# Patient Record
Sex: Male | Born: 1945 | Race: White | Hispanic: No | Smoking: Never smoker
Health system: Southern US, Community
[De-identification: ages and names within clinical notes are randomized; demographics above are authoritative.]

## PROBLEM LIST (undated history)

## (undated) DIAGNOSIS — I872 Venous insufficiency (chronic) (peripheral): Secondary | ICD-10-CM

## (undated) DIAGNOSIS — E1142 Type 2 diabetes mellitus with diabetic polyneuropathy: Secondary | ICD-10-CM

## (undated) DIAGNOSIS — M5116 Intervertebral disc disorders with radiculopathy, lumbar region: Secondary | ICD-10-CM

## (undated) DIAGNOSIS — K589 Irritable bowel syndrome without diarrhea: Secondary | ICD-10-CM

## (undated) DIAGNOSIS — N471 Phimosis: Secondary | ICD-10-CM

## (undated) DIAGNOSIS — M48061 Spinal stenosis, lumbar region without neurogenic claudication: Secondary | ICD-10-CM

## (undated) DIAGNOSIS — M159 Polyosteoarthritis, unspecified: Secondary | ICD-10-CM

## (undated) DIAGNOSIS — F32A Depression, unspecified: Secondary | ICD-10-CM

## (undated) DIAGNOSIS — K219 Gastro-esophageal reflux disease without esophagitis: Secondary | ICD-10-CM

## (undated) DIAGNOSIS — F329 Major depressive disorder, single episode, unspecified: Secondary | ICD-10-CM

## (undated) DIAGNOSIS — C44229 Squamous cell carcinoma of skin of left ear and external auricular canal: Secondary | ICD-10-CM

## (undated) DIAGNOSIS — I10 Essential (primary) hypertension: Secondary | ICD-10-CM

## (undated) DIAGNOSIS — C801 Malignant (primary) neoplasm, unspecified: Secondary | ICD-10-CM

## (undated) HISTORY — DX: Gastro-esophageal reflux disease without esophagitis: K21.9

## (undated) HISTORY — DX: Type 2 diabetes mellitus with diabetic polyneuropathy: E11.42

## (undated) HISTORY — DX: Irritable bowel syndrome, unspecified: K58.9

## (undated) HISTORY — DX: Essential (primary) hypertension: I10

## (undated) HISTORY — PX: REPLACEMENT TOTAL KNEE: SUR1224

## (undated) HISTORY — DX: Major depressive disorder, single episode, unspecified: F32.9

## (undated) HISTORY — PX: TOTAL HIP ARTHROPLASTY: SHX124

## (undated) HISTORY — DX: Depression, unspecified: F32.A

## (undated) HISTORY — DX: Polyosteoarthritis, unspecified: M15.9

---
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
## 2004-11-24 ENCOUNTER — Ambulatory Visit: Payer: Self-pay | Admitting: Internal Medicine

## 2007-02-15 ENCOUNTER — Ambulatory Visit: Payer: Self-pay | Admitting: Internal Medicine

## 2007-02-15 ENCOUNTER — Ambulatory Visit: Payer: Self-pay

## 2007-02-20 ENCOUNTER — Ambulatory Visit: Payer: Self-pay | Admitting: Physician Assistant

## 2008-12-16 ENCOUNTER — Ambulatory Visit: Payer: Self-pay

## 2009-03-07 HISTORY — PX: KNEE ARTHROSCOPY: SUR90

## 2009-04-01 ENCOUNTER — Ambulatory Visit: Payer: Self-pay | Admitting: General Practice

## 2009-04-01 ENCOUNTER — Ambulatory Visit: Payer: Self-pay | Admitting: Internal Medicine

## 2009-04-08 ENCOUNTER — Ambulatory Visit: Payer: Self-pay | Admitting: General Practice

## 2009-07-13 ENCOUNTER — Ambulatory Visit: Payer: Self-pay

## 2009-08-03 ENCOUNTER — Ambulatory Visit: Payer: Self-pay | Admitting: General Practice

## 2009-08-17 ENCOUNTER — Inpatient Hospital Stay: Payer: Self-pay | Admitting: General Practice

## 2009-11-17 ENCOUNTER — Ambulatory Visit: Payer: Self-pay | Admitting: General Practice

## 2009-11-20 ENCOUNTER — Ambulatory Visit: Payer: Self-pay

## 2010-02-17 ENCOUNTER — Ambulatory Visit: Payer: Self-pay | Admitting: General Practice

## 2010-02-22 ENCOUNTER — Inpatient Hospital Stay: Payer: Self-pay | Admitting: General Practice

## 2010-03-02 LAB — PATHOLOGY REPORT

## 2012-02-06 ENCOUNTER — Ambulatory Visit: Payer: Self-pay | Admitting: Gastroenterology

## 2012-02-07 LAB — PATHOLOGY REPORT

## 2014-04-01 ENCOUNTER — Ambulatory Visit: Payer: Self-pay | Admitting: General Practice

## 2016-06-02 DIAGNOSIS — Z0000 Encounter for general adult medical examination without abnormal findings: Secondary | ICD-10-CM | POA: Diagnosis not present

## 2016-07-13 DIAGNOSIS — X32XXXA Exposure to sunlight, initial encounter: Secondary | ICD-10-CM | POA: Diagnosis not present

## 2016-07-13 DIAGNOSIS — D2271 Melanocytic nevi of right lower limb, including hip: Secondary | ICD-10-CM | POA: Diagnosis not present

## 2016-07-13 DIAGNOSIS — L570 Actinic keratosis: Secondary | ICD-10-CM | POA: Diagnosis not present

## 2016-07-13 DIAGNOSIS — D225 Melanocytic nevi of trunk: Secondary | ICD-10-CM | POA: Diagnosis not present

## 2016-07-13 DIAGNOSIS — D2261 Melanocytic nevi of right upper limb, including shoulder: Secondary | ICD-10-CM | POA: Diagnosis not present

## 2016-07-13 DIAGNOSIS — D485 Neoplasm of uncertain behavior of skin: Secondary | ICD-10-CM | POA: Diagnosis not present

## 2016-07-13 DIAGNOSIS — Z85828 Personal history of other malignant neoplasm of skin: Secondary | ICD-10-CM | POA: Diagnosis not present

## 2016-07-13 DIAGNOSIS — L820 Inflamed seborrheic keratosis: Secondary | ICD-10-CM | POA: Diagnosis not present

## 2016-08-29 DIAGNOSIS — X32XXXA Exposure to sunlight, initial encounter: Secondary | ICD-10-CM | POA: Diagnosis not present

## 2016-08-29 DIAGNOSIS — L570 Actinic keratosis: Secondary | ICD-10-CM | POA: Diagnosis not present

## 2016-10-27 DIAGNOSIS — Z125 Encounter for screening for malignant neoplasm of prostate: Secondary | ICD-10-CM | POA: Diagnosis not present

## 2016-10-27 DIAGNOSIS — E119 Type 2 diabetes mellitus without complications: Secondary | ICD-10-CM | POA: Diagnosis not present

## 2016-10-27 DIAGNOSIS — Z79899 Other long term (current) drug therapy: Secondary | ICD-10-CM | POA: Diagnosis not present

## 2016-10-27 DIAGNOSIS — E782 Mixed hyperlipidemia: Secondary | ICD-10-CM | POA: Diagnosis not present

## 2016-11-03 DIAGNOSIS — Z79899 Other long term (current) drug therapy: Secondary | ICD-10-CM | POA: Diagnosis not present

## 2016-11-03 DIAGNOSIS — G4719 Other hypersomnia: Secondary | ICD-10-CM | POA: Diagnosis not present

## 2016-11-03 DIAGNOSIS — I10 Essential (primary) hypertension: Secondary | ICD-10-CM | POA: Diagnosis not present

## 2016-11-03 DIAGNOSIS — Z0000 Encounter for general adult medical examination without abnormal findings: Secondary | ICD-10-CM | POA: Diagnosis not present

## 2016-11-03 DIAGNOSIS — R5383 Other fatigue: Secondary | ICD-10-CM | POA: Diagnosis not present

## 2016-11-03 DIAGNOSIS — E119 Type 2 diabetes mellitus without complications: Secondary | ICD-10-CM | POA: Diagnosis not present

## 2017-02-09 DIAGNOSIS — Z23 Encounter for immunization: Secondary | ICD-10-CM | POA: Diagnosis not present

## 2017-02-26 DIAGNOSIS — J40 Bronchitis, not specified as acute or chronic: Secondary | ICD-10-CM | POA: Diagnosis not present

## 2017-04-18 ENCOUNTER — Ambulatory Visit (INDEPENDENT_AMBULATORY_CARE_PROVIDER_SITE_OTHER): Payer: Medicare Other | Admitting: Internal Medicine

## 2017-04-18 ENCOUNTER — Encounter: Payer: Self-pay | Admitting: Internal Medicine

## 2017-04-18 VITALS — BP 120/72 | HR 77 | Temp 98.3°F | Ht 67.75 in | Wt 226.2 lb

## 2017-04-18 DIAGNOSIS — I739 Peripheral vascular disease, unspecified: Secondary | ICD-10-CM

## 2017-04-18 DIAGNOSIS — K580 Irritable bowel syndrome with diarrhea: Secondary | ICD-10-CM | POA: Diagnosis not present

## 2017-04-18 DIAGNOSIS — M48062 Spinal stenosis, lumbar region with neurogenic claudication: Secondary | ICD-10-CM | POA: Insufficient documentation

## 2017-04-18 DIAGNOSIS — E1142 Type 2 diabetes mellitus with diabetic polyneuropathy: Secondary | ICD-10-CM | POA: Diagnosis not present

## 2017-04-18 DIAGNOSIS — I10 Essential (primary) hypertension: Secondary | ICD-10-CM

## 2017-04-18 DIAGNOSIS — K589 Irritable bowel syndrome without diarrhea: Secondary | ICD-10-CM | POA: Insufficient documentation

## 2017-04-18 DIAGNOSIS — M159 Polyosteoarthritis, unspecified: Secondary | ICD-10-CM | POA: Insufficient documentation

## 2017-04-18 DIAGNOSIS — M150 Primary generalized (osteo)arthritis: Secondary | ICD-10-CM | POA: Diagnosis not present

## 2017-04-18 LAB — CBC
HCT: 41.4 % (ref 39.0–52.0)
Hemoglobin: 13.9 g/dL (ref 13.0–17.0)
MCHC: 33.7 g/dL (ref 30.0–36.0)
MCV: 94.6 fl (ref 78.0–100.0)
PLATELETS: 288 10*3/uL (ref 150.0–400.0)
RBC: 4.37 Mil/uL (ref 4.22–5.81)
RDW: 13.4 % (ref 11.5–15.5)
WBC: 9.1 10*3/uL (ref 4.0–10.5)

## 2017-04-18 LAB — COMPREHENSIVE METABOLIC PANEL
ALT: 24 U/L (ref 0–53)
AST: 18 U/L (ref 0–37)
Albumin: 4.2 g/dL (ref 3.5–5.2)
Alkaline Phosphatase: 60 U/L (ref 39–117)
BILIRUBIN TOTAL: 0.8 mg/dL (ref 0.2–1.2)
BUN: 12 mg/dL (ref 6–23)
CALCIUM: 9.7 mg/dL (ref 8.4–10.5)
CO2: 29 meq/L (ref 19–32)
Chloride: 102 mEq/L (ref 96–112)
Creatinine, Ser: 0.71 mg/dL (ref 0.40–1.50)
GFR: 115.97 mL/min (ref >60.00)
GLUCOSE: 96 mg/dL (ref 70–99)
Potassium: 4 mEq/L (ref 3.5–5.1)
Sodium: 139 mEq/L (ref 135–145)
TOTAL PROTEIN: 7.1 g/dL (ref 6.0–8.3)

## 2017-04-18 LAB — LIPID PANEL
Cholesterol: 169 mg/dL (ref 0–200)
HDL: 34.9 mg/dL — ABNORMAL LOW (ref >39.00)
NONHDL: 134.42
TRIGLYCERIDES: 311 mg/dL — AB (ref 0.0–149.0)
Total CHOL/HDL Ratio: 5
VLDL: 62.2 mg/dL — ABNORMAL HIGH (ref 0.0–40.0)

## 2017-04-18 LAB — HEMOGLOBIN A1C: HEMOGLOBIN A1C: 6.4 % (ref 4.6–6.5)

## 2017-04-18 LAB — LDL CHOLESTEROL, DIRECT: Direct LDL: 82 mg/dL

## 2017-04-18 NOTE — Assessment & Plan Note (Signed)
Has had excellent control Very early neurologic changes Will need statin--I will prescribe after labs and circulation tests

## 2017-04-18 NOTE — Assessment & Plan Note (Signed)
Mild symptoms now after 2 joint replacements

## 2017-04-18 NOTE — Assessment & Plan Note (Signed)
BP Readings from Last 3 Encounters:  04/18/17 120/72   Good control

## 2017-04-18 NOTE — Assessment & Plan Note (Signed)
Fairly classic symptoms in diabetic with decreased pulses Will check ABI and proceed with referral if confirmed

## 2017-04-18 NOTE — Progress Notes (Signed)
Subjective:    Patient ID: Frank Vega, male    DOB: 12/05/1945, 72 y.o.   MRN: [id number]  HPI Here to establish for ongoing care Didn't feel he was "on the same wavelength" with Dr Baldemar Lenis Had really been happy with Dr Arline Asp  Having some ongoing leg pain Got different medications---not a clear diagnosis Feet swell at times Muscles in back of calves tighten up---trouble with walking No problems at rest or in bed  Diabetes for 7 years of so Does have mild abnormal sensation in feet--- notes it especially when driving Good control--tries to eat right  Recent laser Rx of right eye--but not diabetic changes  Known HTN Good control in general  History of IBS Diarrhea predominant---has lomotil for prn use  Known osteoarthritis Some ongoing pain in left knee despite TKR No other major problems--occasional back pain if sits  Depression back years ago Had been staying with his mom for 2 years --caregiving. Eventually died from dementia Also had stress in his job Was on paxil--didn't like that. Then lexapro--weaned off years ago and no ongoing problems  Acid reflux in past Better now  Current Outpatient Medications on File Prior to Visit  Medication Sig Dispense Refill  . amLODipine (NORVASC) 5 MG tablet Take 5 mg by mouth daily.    Marland Kitchen aspirin 81 MG chewable tablet Chew 81 mg by mouth daily.    . diphenoxylate-atropine (LOMOTIL) 2.5-0.025 MG tablet Take 1 tablet by mouth daily as needed for diarrhea or loose stools.    Marland Kitchen glipiZIDE (GLUCOTROL XL) 5 MG 24 hr tablet Take 5 mg by mouth daily with breakfast.    . lisinopril-hydrochlorothiazide (PRINZIDE,ZESTORETIC) 20-12.5 MG tablet Take 2 tablets by mouth daily.    . metFORMIN (GLUCOPHAGE-XR) 750 MG 24 hr tablet Take 750 mg by mouth daily.    . Multiple Vitamins-Minerals (CENTRUM SILVER PO) Take 1 tablet by mouth daily.     No current facility-administered medications on file prior to visit.     Allergies  Allergen  Reactions  . Paxil [Paroxetine Hcl]     Weight loss and jittery  . Shellfish Allergy Swelling  . Latex Rash    Past Medical History:  Diagnosis Date  . 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 GERD (gastroesophageal reflux disease)   . Hypertension   . IBS (irritable bowel syndrome)   . Osteoarthritis of multiple joints     Past Surgical History:  Procedure Laterality Date  . CYST EXCISION  1952   left leg  . KNEE ARTHROSCOPY Left 2011  . REPLACEMENT TOTAL KNEE Left   . TOTAL HIP ARTHROPLASTY Right     Family History  Problem Relation Age of Onset  . Arthritis Mother   . Hypertension Mother   . Dementia Mother   . Heart attack Father   . Heart disease Father   . Cancer Father        lymphoma    Social History   Socioeconomic History  . Marital status: Married    Spouse name: Not on file  . Number of children: 1  . Years of education: Not on file  . Highest education level: Not on file  Social Needs  . Financial resource strain: Not on file  . Food insecurity - worry: Not on file  . Food insecurity - inability: Not on file  . Transportation needs - medical: Not on file  . Transportation needs -  non-medical: Not on file  Occupational History  . Occupation: Electrical engineer    Comment: Retired  Tobacco Use  . Smoking status: Never Smoker  . Smokeless tobacco: Never Used  Substance and Sexual Activity  . Alcohol use: No    Frequency: Never  . Drug use: No  . Sexual activity: No  Other Topics Concern  . Not on file  Social History Narrative   1 daughter--local      No living will   Wife, then daughter, should make health care decisions   Would accept resuscitation   Not sure about tube feeds   Review of Systems  Constitutional: Negative for fatigue and unexpected weight change.  HENT: Positive for hearing loss.        Right ear hearing loss Top partial plate  Eyes: Positive  for visual disturbance.       Needs follow up  Respiratory: Negative for cough, chest tightness and shortness of breath.   Cardiovascular: Positive for leg swelling. Negative for chest pain and palpitations.  Gastrointestinal: Positive for diarrhea. Negative for abdominal pain and blood in stool.  Endocrine: Negative for polydipsia and polyuria.  Genitourinary: Positive for frequency. Negative for difficulty urinating.       Nocturia x 1 Foreskin is stuck closed  Musculoskeletal: Positive for arthralgias. Negative for joint swelling.  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  Neurological: Negative for dizziness, syncope and light-headedness.  Psychiatric/Behavioral: Negative for dysphoric mood and sleep disturbance. The patient is not nervous/anxious.        Sleeps 6.5-7 hours most nights       Objective:   Physical Exam  Constitutional: He appears well-developed. No distress.  Neck: No thyromegaly present.  Cardiovascular: Normal rate, regular rhythm and normal heart sounds. Exam reveals no gallop.  No murmur heard. Fair left foot DP but no PT Very faint right PT but no DP  Pulmonary/Chest: Effort normal and breath sounds normal. No respiratory distress. He has no wheezes. He has no rales.  Abdominal: Soft. There is no tenderness.  Musculoskeletal: He exhibits no edema or tenderness.  Lymphadenopathy:    He has no cervical adenopathy.  Psychiatric: He has a normal mood and affect. His behavior is normal.          Assessment & Plan:

## 2017-04-18 NOTE — Assessment & Plan Note (Signed)
Uses the lomotil prn (rarely)

## 2017-04-19 ENCOUNTER — Other Ambulatory Visit: Payer: Self-pay

## 2017-04-19 MED ORDER — ATORVASTATIN CALCIUM 10 MG PO TABS: 10.0000 mg | ORAL_TABLET | Freq: Every day | ORAL | 3 refills | Status: DC

## 2017-04-19 NOTE — Telephone Encounter (Signed)
Per lab result, pt agrees to start atorvastatin 10mg .

## 2017-05-03 ENCOUNTER — Ambulatory Visit (INDEPENDENT_AMBULATORY_CARE_PROVIDER_SITE_OTHER): Payer: Medicare Other

## 2017-05-03 DIAGNOSIS — I739 Peripheral vascular disease, unspecified: Secondary | ICD-10-CM

## 2017-05-18 DIAGNOSIS — Z86010 Personal history of colonic polyps: Secondary | ICD-10-CM | POA: Diagnosis not present

## 2017-05-22 DIAGNOSIS — X32XXXA Exposure to sunlight, initial encounter: Secondary | ICD-10-CM | POA: Diagnosis not present

## 2017-05-22 DIAGNOSIS — L570 Actinic keratosis: Secondary | ICD-10-CM | POA: Diagnosis not present

## 2017-05-22 DIAGNOSIS — D225 Melanocytic nevi of trunk: Secondary | ICD-10-CM | POA: Diagnosis not present

## 2017-05-22 DIAGNOSIS — L568 Other specified acute skin changes due to ultraviolet radiation: Secondary | ICD-10-CM | POA: Diagnosis not present

## 2017-05-22 DIAGNOSIS — D2261 Melanocytic nevi of right upper limb, including shoulder: Secondary | ICD-10-CM | POA: Diagnosis not present

## 2017-05-22 DIAGNOSIS — Z85828 Personal history of other malignant neoplasm of skin: Secondary | ICD-10-CM | POA: Diagnosis not present

## 2017-05-23 ENCOUNTER — Encounter: Payer: Self-pay | Admitting: Internal Medicine

## 2017-05-23 ENCOUNTER — Ambulatory Visit (INDEPENDENT_AMBULATORY_CARE_PROVIDER_SITE_OTHER): Payer: Medicare Other | Admitting: Internal Medicine

## 2017-05-23 VITALS — BP 124/78 | HR 70 | Temp 98.0°F | Wt 228.2 lb

## 2017-05-23 DIAGNOSIS — M48062 Spinal stenosis, lumbar region with neurogenic claudication: Secondary | ICD-10-CM

## 2017-05-23 MED ORDER — DIPHENOXYLATE-ATROPINE 2.5-0.025 MG PO TABS: 1.0000 | ORAL_TABLET | Freq: Every day | ORAL | 1 refills | Status: DC | PRN

## 2017-05-23 NOTE — Progress Notes (Signed)
Subjective:    Patient ID: Frank Vega, male    DOB: 1945/09/08, 72 y.o.   MRN: [id number]  HPI Here for review of leg pain  When he stands up--he has to work to balance himself Through the day, things seem to progress with increasing calf pain Right foot seems to swell--larger than left Some decreased sensation in toes--notices when driving  Calf muscles are where he feels the pain Notices it just standing in line--will shift his feet for balance--but pain if on feet for a while Increasing pain with walking (tight feeling)--better with rest  Right THR--no pain Some swelling (fluid?) in left knee with TKR No sig pain in left hip or right knee  Current Outpatient Medications on File Prior to Visit  Medication Sig Dispense Refill  . amLODipine (NORVASC) 5 MG tablet Take 5 mg by mouth daily.    Marland Kitchen atorvastatin (LIPITOR) 10 MG tablet Take 1 tablet (10 mg total) by mouth daily. 90 tablet 3  . diphenoxylate-atropine (LOMOTIL) 2.5-0.025 MG tablet Take 1 tablet by mouth daily as needed for diarrhea or loose stools.    Marland Kitchen glipiZIDE (GLUCOTROL XL) 5 MG 24 hr tablet Take 5 mg by mouth daily with breakfast.    . lisinopril-hydrochlorothiazide (PRINZIDE,ZESTORETIC) 20-12.5 MG tablet Take 2 tablets by mouth daily.    . metFORMIN (GLUCOPHAGE-XR) 750 MG 24 hr tablet Take 750 mg by mouth daily.    . mometasone (ELOCON) 0.1 % ointment Apply topically 2 (two) times daily.    . Multiple Vitamins-Minerals (CENTRUM SILVER PO) Take 1 tablet by mouth daily.    Marland Kitchen aspirin 81 MG chewable tablet Chew 81 mg by mouth daily.     No current facility-administered medications on file prior to visit.     Allergies  Allergen Reactions  . Paxil [Paroxetine Hcl]     Weight loss and jittery  . Shellfish Allergy Swelling  . Latex Rash    Past Medical History:  Diagnosis Date  . 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 GERD (gastroesophageal  reflux disease)   . Hypertension   . IBS (irritable bowel syndrome)   . Osteoarthritis of multiple joints     Past Surgical History:  Procedure Laterality Date  . CYST EXCISION  1952   left leg  . KNEE ARTHROSCOPY Left 2011  . REPLACEMENT TOTAL KNEE Left   . TOTAL HIP ARTHROPLASTY Right     Family History  Problem Relation Age of Onset  . Arthritis Mother   . Hypertension Mother   . Dementia Mother   . Heart attack Father   . Heart disease Father   . Cancer Father        lymphoma    Social History   Socioeconomic History  . Marital status: Married    Spouse name: Not on file  . Number of children: 1  . Years of education: Not on file  . Highest education level: Not on file  Social Needs  . Financial resource strain: Not on file  . Food insecurity - worry: Not on file  . Food insecurity - inability: Not on file  . Transportation needs - medical: Not on file  . Transportation needs - non-medical: Not on file  Occupational History  . Occupation: Electrical engineer    Comment: Retired  Tobacco Use  . Smoking status: Never Smoker  . Smokeless tobacco: Never Used  Substance and Sexual Activity  .  Alcohol use: No    Frequency: Never  . Drug use: No  . Sexual activity: No  Other Topics Concern  . Not on file  Social History Narrative   1 daughter--local      No living will   Wife, then daughter, should make health care decisions   Would accept resuscitation   Not sure about tube feeds   Review of Systems Weight is stable Appetite is fine Not sleeping well Just saw Dr Evorn Gong for rash on arm--- got mometasone from him    Objective:   Physical Exam  Cardiovascular:  Feet warm Faint pulse left foot, absent on right  Musculoskeletal:  Hip and knee ROM all fine No swelling No spine tenderness  Neurological:  Normal leg strength          Assessment & Plan:

## 2017-05-23 NOTE — Patient Instructions (Signed)
Spinal Stenosis Spinal stenosis occurs when the open space (spinal canal) between the bones of your spine (vertebrae) narrows, putting pressure on the spinal cord or nerves. What are the causes? This condition is caused by areas of bone pushing into the central canals of your vertebrae. This condition may be present at birth (congenital), or it may be caused by:  Arthritic deterioration of your vertebrae (spinal degeneration). This usually starts around age 25.  Injury or trauma to the spine.  Tumors in the spine.  Calcium deposits in the spine.  What are the signs or symptoms? Symptoms of this condition include:  Pain in the neck or back that is generally worse with activities, particularly when standing and walking.  Numbness, tingling, hot or cold sensations, weakness, or weariness in your legs.  Pain going up and down the leg (sciatica).  Frequent episodes of falling.  A foot-slapping gait that leads to muscle weakness.  In more serious cases, you may develop:  Problemspassing stool or passing urine.  Difficulty having sex.  Loss of feeling in part or all of your leg.  Symptoms may come on slowly and get worse over time. How is this diagnosed? This condition is diagnosed based on your medical history and a physical exam. Tests will also be done, such as:  MRI.  CT scan.  X-ray.  How is this treated? Treatment for this condition often focuses on managing your pain and any other symptoms. Treatment may include:  Practicing good posture to lessen pressure on your nerves.  Exercising to strengthen muscles, build endurance, improve balance, and maintain good joint movement (range of motion).  Losing weight, if needed.  Taking medicines to reduce swelling, inflammation, or pain.  Assistive devices, such as a corset or brace.  In some cases, surgery may be needed. The most common procedure is decompression laminectomy. This is done to remove excess bone that  puts pressure on your nerve roots. Follow these instructions at home: Managing pain, stiffness, and swelling  Do all exercises and stretches as told by your health care provider.  Practice good posture. If you were given a brace or a corset, wear it as told by your health care provider.  Do not do any activities that cause pain. Ask your health care provider what activities are safe for you.  Do not lift anything that is heavier than 10 lb (4.5 kg) or the limit that your health care provider tells you.  Maintain a healthy weight. Talk with your health care provider if you need help losing weight.  If directed, apply heat to the affected area as often as told by your health care provider. Use the heat source that your health care provider recommends, such as a moist heat pack or a heating pad. ? Place a towel between your skin and the heat source. ? Leave the heat on for 20-30 minutes. ? Remove the heat if your skin turns bright red. This is especially important if you are not able to feel pain, heat, or cold. You may have a greater risk of getting burned. General instructions  Take over-the-counter and prescription medicines only as told by your health care provider.  Do not use any products that contain nicotine or tobacco, such as cigarettes and e-cigarettes. If you need help quitting, ask your health care provider.  Eat a healthy diet. This includes plenty of fruits and vegetables, whole grains, and low-fat (lean) protein.  Keep all follow-up visits as told by your health care provider.  This is important. Contact a health care provider if:  Your symptoms do not get better or they get worse.  You have a fever. Get help right away if:  You have new or worse pain in your neck or upper back.  You have severe pain that cannot be controlled with medicines.  You are dizzy.  You have vision problems, blurred vision, or double vision.  You have a severe headache that is worse when  you stand.  You have nausea or you vomit.  You develop new or worse numbness or tingling in your back or legs.  You have pain, redness, swelling, or warmth in your arm or leg. Summary  Spinal stenosis occurs when the open space (spinal canal) between the bones of your spine (vertebrae) narrows. This narrowing puts pressure on the spinal cord or nerves.  Spinal stenosis can cause numbness, weakness, or pain in the neck, back, and legs.  This condition may be caused by a birth defect, arthritic deterioration of your vertebrae, injury, tumors, or calcium deposits.  This condition is usually diagnosed with MRIs, CT scans, and X-rays. This information is not intended to replace advice given to you by your health care provider. Make sure you discuss any questions you have with your health care provider. Document Released: 05/14/2003 Document Revised: 01/27/2016 Document Reviewed: 01/27/2016 Elsevier Interactive Patient Education  2018 [employer]  

## 2017-05-23 NOTE — Assessment & Plan Note (Signed)
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

## 2017-06-26 ENCOUNTER — Telehealth: Payer: Self-pay

## 2017-06-26 NOTE — Telephone Encounter (Signed)
Could pt be added on before 06/29/17?

## 2017-06-26 NOTE — Telephone Encounter (Signed)
Can add at noon tomorrow---or same day Thursday afternoon

## 2017-06-26 NOTE — Telephone Encounter (Signed)
Copied from [place]. Topic: Appointment Scheduling - Scheduling Inquiry for Clinic >> Jun 26, 2017  4:44 PM Percell Belt A wrote: Reason for CRM: pt called in pt right foot has been swelling last 2 weeks. Would like to see Dr Silvio Pate only.  Tired to offer other providers.  Pt only wanted to see his PCP    Best number [phone]

## 2017-06-27 ENCOUNTER — Encounter: Payer: Self-pay | Admitting: Internal Medicine

## 2017-06-27 ENCOUNTER — Ambulatory Visit (INDEPENDENT_AMBULATORY_CARE_PROVIDER_SITE_OTHER): Payer: Medicare Other | Admitting: Internal Medicine

## 2017-06-27 VITALS — BP 120/66 | HR 72 | Temp 98.1°F | Ht 67.75 in | Wt 226.0 lb

## 2017-06-27 DIAGNOSIS — I872 Venous insufficiency (chronic) (peripheral): Secondary | ICD-10-CM | POA: Insufficient documentation

## 2017-06-27 DIAGNOSIS — R600 Localized edema: Secondary | ICD-10-CM | POA: Diagnosis not present

## 2017-06-27 NOTE — Progress Notes (Signed)
Subjective:    Patient ID: Frank Vega, male    DOB: 1945/08/30, 72 y.o.   MRN: [id number]  HPI Here due to right foot swelling  Has noticed about 2 months--but gradually worsening Shoes not fitting as well---even with the wider ones No pain ---unless shoe is tight No known injury No redness No varicose veins Is better first thing in the morning---worsens as the day goes on Hasn't tried support socks  Current Outpatient Medications on File Prior to Visit  Medication Sig Dispense Refill  . amLODipine (NORVASC) 5 MG tablet Take 5 mg by mouth daily.    Marland Kitchen aspirin 81 MG chewable tablet Chew 81 mg by mouth daily.    Marland Kitchen atorvastatin (LIPITOR) 10 MG tablet Take 1 tablet (10 mg total) by mouth daily. 90 tablet 3  . diphenoxylate-atropine (LOMOTIL) 2.5-0.025 MG tablet Take 1 tablet by mouth daily as needed for diarrhea or loose stools. 30 tablet 1  . glipiZIDE (GLUCOTROL XL) 5 MG 24 hr tablet Take 5 mg by mouth daily with breakfast.    . lisinopril-hydrochlorothiazide (PRINZIDE,ZESTORETIC) 20-12.5 MG tablet Take 2 tablets by mouth daily.    . metFORMIN (GLUCOPHAGE-XR) 750 MG 24 hr tablet Take 750 mg by mouth daily.    . mometasone (ELOCON) 0.1 % ointment Apply topically 2 (two) times daily.    . Multiple Vitamins-Minerals (CENTRUM SILVER PO) Take 1 tablet by mouth daily.     No current facility-administered medications on file prior to visit.     Allergies  Allergen Reactions  . Paxil [Paroxetine Hcl]     Weight loss and jittery  . Shellfish Allergy Swelling  . Latex Rash    Past Medical History:  Diagnosis Date  . 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 GERD (gastroesophageal reflux disease)   . Hypertension   . IBS (irritable bowel syndrome)   . Osteoarthritis of multiple joints     Past Surgical History:  Procedure Laterality Date  . CYST EXCISION  1952   left leg  . KNEE ARTHROSCOPY Left 2011  .  REPLACEMENT TOTAL KNEE Left   . TOTAL HIP ARTHROPLASTY Right     Family History  Problem Relation Age of Onset  . Arthritis Mother   . Hypertension Mother   . Dementia Mother   . Heart attack Father   . Heart disease Father   . Cancer Father        lymphoma    Social History   Socioeconomic History  . Marital status: Married    Spouse name: Not on file  . Number of children: 1  . Years of education: Not on file  . Highest education level: Not on file  Occupational History  . 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 Financial resource strain: Not on file  . Food insecurity:    Worry: Not on file    Inability: Not on file  . Transportation needs:    Medical: Not on file    Non-medical: Not on file  Tobacco Use  . Smoking status: Never Smoker  . Smokeless tobacco: Never Used  Substance and Sexual Activity  . Alcohol use: No    Frequency: Never  . Drug use: No  . Sexual activity: Never  Lifestyle  . Physical activity:    Days per week: Not on file    Minutes per session: Not on file  .  Stress: Not on file  Relationships  . Social connections:    Talks on phone: Not on file    Gets together: Not on file    Attends religious service: Not on file    Active member of club or organization: Not on file    Attends meetings of clubs or organizations: Not on file    Relationship status: Not on file  . Intimate partner violence:    Fear of current or ex partner: Not on file    Emotionally abused: Not on file    Physically abused: Not on file    Forced sexual activity: Not on file  Other Topics Concern  . Not on file  Social History Narrative   1 daughter--local      No living will   Wife, then daughter, should make health care decisions   Would accept resuscitation   Not sure about tube feeds   Review of Systems No chest pain No SOB Sleeps flat in bed--no PND Weight is stable    Objective:   Physical Exam    Constitutional: No distress.  Neck: No thyromegaly present.  Cardiovascular: Normal rate, regular rhythm and normal heart sounds. Exam reveals no gallop.  No murmur heard. Pulmonary/Chest: Effort normal. No respiratory distress. He has no wheezes. He has no rales.  Musculoskeletal:  Slight edema in right foot Right calf 42cm, left 40cm No tenderness   Lymphadenopathy:    He has no cervical adenopathy.          Assessment & Plan:

## 2017-06-27 NOTE — Assessment & Plan Note (Signed)
Likely venous insufficiency No injury and nothing to suggest DVT Symptoms suggest that his diabetic neuropathy may be a big part of the symptoms Discussed using compression socks If worsens, could consider low dose furosemide Consider gabapentin if more clear neuropathy pain

## 2017-06-27 NOTE — Patient Instructions (Signed)
Please try the compression socks---on in the morning and off at night. Let me know if the swelling, or pain, worsens.

## 2017-06-27 NOTE — Telephone Encounter (Signed)
I spoke with Mrs Saleem Coccia signed and she scheduled pt for today at 12 noon.

## 2017-07-14 ENCOUNTER — Ambulatory Visit: Payer: Medicare Other | Admitting: Internal Medicine

## 2017-07-17 ENCOUNTER — Encounter: Payer: Self-pay | Admitting: *Deleted

## 2017-07-17 MED ORDER — SODIUM CHLORIDE 0.9 % IV SOLN
INTRAVENOUS | Status: DC
  Administered 2017-07-18: 1000 mL via INTRAVENOUS

## 2017-07-18 ENCOUNTER — Encounter
Admission: RE | Disposition: A | Discharge status: Home / Self Care | Payer: Self-pay | Source: Ambulatory Visit | Attending: Gastroenterology

## 2017-07-18 ENCOUNTER — Ambulatory Visit: Payer: Medicare Other | Admitting: Anesthesiology

## 2017-07-18 ENCOUNTER — Ambulatory Visit
Admission: RE | Admit: 2017-07-18 | Discharge: 2017-07-18 | Disposition: A | Discharge status: Home / Self Care | Payer: Medicare Other | Source: Ambulatory Visit | Attending: Gastroenterology | Admitting: Gastroenterology

## 2017-07-18 ENCOUNTER — Encounter: Payer: Self-pay | Admitting: Anesthesiology

## 2017-07-18 DIAGNOSIS — I10 Essential (primary) hypertension: Secondary | ICD-10-CM | POA: Diagnosis not present

## 2017-07-18 DIAGNOSIS — K5790 Diverticulosis of intestine, part unspecified, without perforation or abscess without bleeding: Secondary | ICD-10-CM | POA: Diagnosis not present

## 2017-07-18 DIAGNOSIS — D128 Benign neoplasm of rectum: Secondary | ICD-10-CM | POA: Diagnosis not present

## 2017-07-18 DIAGNOSIS — E1140 Type 2 diabetes mellitus with diabetic neuropathy, unspecified: Secondary | ICD-10-CM | POA: Insufficient documentation

## 2017-07-18 DIAGNOSIS — K621 Rectal polyp: Secondary | ICD-10-CM | POA: Insufficient documentation

## 2017-07-18 DIAGNOSIS — K5730 Diverticulosis of large intestine without perforation or abscess without bleeding: Secondary | ICD-10-CM | POA: Insufficient documentation

## 2017-07-18 DIAGNOSIS — Z86010 Personal history of colonic polyps: Secondary | ICD-10-CM | POA: Diagnosis not present

## 2017-07-18 DIAGNOSIS — Z7984 Long term (current) use of oral hypoglycemic drugs: Secondary | ICD-10-CM | POA: Diagnosis not present

## 2017-07-18 DIAGNOSIS — Z79899 Other long term (current) drug therapy: Secondary | ICD-10-CM | POA: Diagnosis not present

## 2017-07-18 DIAGNOSIS — K635 Polyp of colon: Secondary | ICD-10-CM | POA: Insufficient documentation

## 2017-07-18 DIAGNOSIS — Z1211 Encounter for screening for malignant neoplasm of colon: Secondary | ICD-10-CM | POA: Insufficient documentation

## 2017-07-18 DIAGNOSIS — D125 Benign neoplasm of sigmoid colon: Secondary | ICD-10-CM | POA: Diagnosis not present

## 2017-07-18 DIAGNOSIS — Z7982 Long term (current) use of aspirin: Secondary | ICD-10-CM | POA: Diagnosis not present

## 2017-07-18 HISTORY — PX: COLONOSCOPY WITH PROPOFOL: SHX5780

## 2017-07-18 LAB — GLUCOSE, CAPILLARY: Glucose-Capillary: 141 mg/dL — ABNORMAL HIGH (ref 65–99)

## 2017-07-18 SURGERY — COLONOSCOPY WITH PROPOFOL
Anesthesia: General

## 2017-07-18 MED ORDER — EPHEDRINE SULFATE 50 MG/ML IJ SOLN
INTRAMUSCULAR | Status: DC | PRN
  Administered 2017-07-18 (×2): 10 mg via INTRAVENOUS

## 2017-07-18 MED ORDER — PROPOFOL 10 MG/ML IV BOLUS
INTRAVENOUS | Status: AC
  Filled 2017-07-18: qty 20

## 2017-07-18 MED ORDER — MIDAZOLAM HCL 2 MG/2ML IJ SOLN
INTRAMUSCULAR | Status: DC | PRN
  Administered 2017-07-18: 2 mg via INTRAVENOUS

## 2017-07-18 MED ORDER — PROPOFOL 500 MG/50ML IV EMUL
INTRAVENOUS | Status: DC | PRN
  Administered 2017-07-18: 120 ug/kg/min via INTRAVENOUS

## 2017-07-18 MED ORDER — FENTANYL CITRATE (PF) 100 MCG/2ML IJ SOLN
INTRAMUSCULAR | Status: AC
  Filled 2017-07-18: qty 2

## 2017-07-18 MED ORDER — MIDAZOLAM HCL 2 MG/2ML IJ SOLN
INTRAMUSCULAR | Status: AC
  Filled 2017-07-18: qty 2

## 2017-07-18 MED ORDER — EPHEDRINE SULFATE 50 MG/ML IJ SOLN
INTRAMUSCULAR | Status: AC
  Filled 2017-07-18: qty 1

## 2017-07-18 MED ORDER — FENTANYL CITRATE (PF) 100 MCG/2ML IJ SOLN
INTRAMUSCULAR | Status: DC | PRN
  Administered 2017-07-18: 50 ug via INTRAVENOUS

## 2017-07-18 MED ORDER — LIDOCAINE HCL (PF) 1 % IJ SOLN
INTRAMUSCULAR | Status: AC
  Administered 2017-07-18: 0.3 mL via INTRADERMAL
  Filled 2017-07-18: qty 2

## 2017-07-18 MED ORDER — PROPOFOL 500 MG/50ML IV EMUL
INTRAVENOUS | Status: AC
  Filled 2017-07-18: qty 50

## 2017-07-18 MED ORDER — LIDOCAINE HCL (PF) 1 % IJ SOLN
2.0000 mL | Freq: Once | INTRAMUSCULAR | Status: AC
  Administered 2017-07-18: 0.3 mL via INTRADERMAL

## 2017-07-18 NOTE — Anesthesia Preprocedure Evaluation (Signed)
Anesthesia Evaluation  Patient identified by MRN, date of birth, ID band Patient awake    Reviewed: Allergy & Precautions, H&P , NPO status , Patient's Chart, lab work & pertinent test results, reviewed documented beta blocker date and time   History of Anesthesia Complications Negative for: history of anesthetic complications  Airway Mallampati: III  TM Distance: >3 FB Neck ROM: full    Dental  (+) Dental Advidsory Given, Partial Upper   Pulmonary neg pulmonary ROS,           Cardiovascular Exercise Tolerance: Good hypertension, (-) angina(-) CAD, (-) Past MI, (-) Cardiac Stents and (-) CABG (-) dysrhythmias (-) Valvular Problems/Murmurs     Neuro/Psych PSYCHIATRIC DISORDERS Depression negative neurological ROS     GI/Hepatic Neg liver ROS, GERD  ,  Endo/Other  diabetes, Oral Hypoglycemic Agents  Renal/GU negative Renal ROS  negative genitourinary   Musculoskeletal   Abdominal   Peds  Hematology negative hematology ROS (+)   Anesthesia Other Findings Past Medical History: ~2010: Depression     Comment:  situational and now better  No date: Diabetes mellitus with polyneuropathy ([facility]) No date: GERD (gastroesophageal reflux disease) No date: Hypertension No date: IBS (irritable bowel syndrome) No date: Osteoarthritis of multiple joints   Reproductive/Obstetrics negative OB ROS                             Anesthesia Physical Anesthesia Plan  ASA: II  Anesthesia Plan: General   Post-op Pain Management:    Induction: Intravenous  PONV Risk Score and Plan: 2 and Propofol infusion  Airway Management Planned: Nasal Cannula  Additional Equipment:   Intra-op Plan:   Post-operative Plan:   Informed Consent: I have reviewed the patients History and Physical, chart, labs and discussed the procedure including the risks, benefits and alternatives for the proposed anesthesia with the  patient or authorized representative who has indicated his/her understanding and acceptance.   Dental Advisory Given  Plan Discussed with: Anesthesiologist, CRNA and Surgeon  Anesthesia Plan Comments:         Anesthesia Quick Evaluation

## 2017-07-18 NOTE — Anesthesia Postprocedure Evaluation (Signed)
Anesthesia Post Note  Patient: Frank Vega  Procedure(s) Performed: COLONOSCOPY WITH PROPOFOL (N/A )  Patient location during evaluation: Endoscopy Anesthesia Type: General Level of consciousness: awake and alert Pain management: pain level controlled Vital Signs Assessment: post-procedure vital signs reviewed and stable Respiratory status: spontaneous breathing, nonlabored ventilation, respiratory function stable and patient connected to nasal cannula oxygen Cardiovascular status: blood pressure returned to baseline and stable Postop Assessment: no apparent nausea or vomiting Anesthetic complications: no     Last Vitals:  Vitals:   07/18/17 0757 07/18/17 0915  BP: 114/86 109/63  Pulse: 74   Resp: 16 17  Temp: (!) 36.4 C (!) 36.1 C  SpO2: 97% 100%    Last Pain:  Vitals:   07/18/17 0957  TempSrc:   PainSc: 0-No pain                 Martha Clan

## 2017-07-18 NOTE — Transfer of Care (Signed)
Immediate Anesthesia Transfer of Care Note  Patient: Frank Vega  Procedure(s) Performed: COLONOSCOPY WITH PROPOFOL (N/A )  Patient Location: PACU  Anesthesia Type:General  Level of Consciousness: awake and sedated  Airway & Oxygen Therapy: Patient Spontanous Breathing and Patient connected to nasal cannula oxygen  Post-op Assessment: Report given to RN and Post -op Vital signs reviewed and stable  Post vital signs: Reviewed and stable  Last Vitals:  Vitals Value Taken Time  BP    Temp    Pulse    Resp    SpO2      Last Pain:  Vitals:   07/18/17 0757  TempSrc: Tympanic  PainSc: 0-No pain         Complications: No apparent anesthesia complications

## 2017-07-18 NOTE — Anesthesia Post-op Follow-up Note (Signed)
Anesthesia QCDR form completed.        

## 2017-07-18 NOTE — Op Note (Signed)
[facility] Gastroenterology Patient Name: Frank Vega Procedure Date: 07/18/2017 8:24 AM MRN: [id number] Account #: [id number] Date of Birth: 11-21-1945 Admit Type: Outpatient Age: 72 Room: [facility] ENDO ROOM 3 Gender: Male Note Status: Finalized Procedure:            Colonoscopy Indications:          Personal history of colonic polyps Providers:            Lollie Sails, MD Referring MD:         Venia Carbon (Referring MD) Medicines:            Monitored Anesthesia Care Complications:        No immediate complications. Procedure:            Pre-Anesthesia Assessment:                       - ASA Grade Assessment: II - A patient with mild                        systemic disease.                       After obtaining informed consent, the colonoscope was                        passed under direct vision. Throughout the procedure,                        the patient's blood pressure, pulse, and oxygen                        saturations were monitored continuously. The                        Colonoscope was introduced through the anus and                        advanced to the the cecum, identified by appendiceal                        orifice and ileocecal valve. The colonoscopy was                        performed with moderate difficulty due to a redundant                        colon. Successful completion of the procedure was aided                        by changing the patient to a supine position and                        changing the patient to a prone position. The patient                        tolerated the procedure well. The quality of the bowel                        preparation was good. Findings:      Multiple small-mouthed diverticula were found in the sigmoid  colon and       distal descending colon.      Four sessile polyps were found in the rectum. The polyps were 1 to 3 mm       in size. These polyps were removed with a cold biopsy  forceps. Resection       and retrieval were complete.      A 3 mm polyp was found in the proximal sigmoid colon. The polyp was       sessile. The polyp was removed with a cold biopsy forceps. Resection and       retrieval were complete.      Three sessile polyps were found in the mid sigmoid colon. The polyps       were 1 to 3 mm in size. These polyps were removed with a cold biopsy       forceps. Resection and retrieval were complete.      The retroflexed view of the distal rectum and anal verge was normal and       showed no anal or rectal abnormalities.      The digital rectal exam was normal. Impression:           - Diverticulosis in the sigmoid colon and in the distal                        descending colon.                       - Four 1 to 3 mm polyps in the rectum, removed with a                        cold biopsy forceps. Resected and retrieved.                       - One 3 mm polyp in the proximal sigmoid colon, removed                        with a cold biopsy forceps. Resected and retrieved.                       - Three 1 to 3 mm polyps in the mid sigmoid colon,                        removed with a cold biopsy forceps. Resected and                        retrieved.                       - The distal rectum and anal verge are normal on                        retroflexion view. Recommendation:       - Discharge patient to home.                       - Await pathology results.                       - Telephone GI clinic for pathology results in 1 week. Procedure Code(s):    --- Professional ---  45380, Colonoscopy, flexible; with biopsy, single or                        multiple Diagnosis Code(s):    --- Professional ---                       K62.1, Rectal polyp                       D12.5, Benign neoplasm of sigmoid colon                       Z86.010, Personal history of colonic polyps                       K57.30, Diverticulosis of large intestine  without                        perforation or abscess without bleeding CPT copyright 2017 American Medical Association. All rights reserved. The codes documented in this report are preliminary and upon coder review may  be revised to meet current compliance requirements. Lollie Sails, MD 07/18/2017 9:11:44 AM This report has been signed electronically. Number of Addenda: 0 Note Initiated On: 07/18/2017 8:24 AM Scope Withdrawal Time: 0 hours 22 minutes 43 seconds  Total Procedure Duration: 0 hours 35 minutes 34 seconds       [facility]

## 2017-07-18 NOTE — H&P (Signed)
Outpatient short stay form Pre-procedure 07/18/2017 8:13 AM Frank Sails MD  Primary Physician: Dr Viviana Simpler   Reason for visit:  colonoscopy  History of present illness:  Patient is a 72 yo male.  Resenting today for a colonoscopy in regards to his personal history of adenomatous colon polyps.  Tolerated his prep well.  He does take a 81 mg aspirin that he is held for since last week.  He takes no other aspirin products or blood thinning agent.  He tolerated his prep well.    Current Facility-Administered Medications:  .  lidocaine (PF) (XYLOCAINE) 1 % injection, , , ,  .  0.9 %  sodium chloride infusion, , Intravenous, Continuous, Kyrin Gratz U, MD .  lidocaine (PF) (XYLOCAINE) 1 % injection 2 mL, 2 mL, Intradermal, Once, Frank Sails, MD  Medications Prior to Admission  Medication Sig Dispense Refill Last Dose  . amLODipine (NORVASC) 5 MG tablet Take 5 mg by mouth daily.   Taking  . aspirin 81 MG chewable tablet Chew 81 mg by mouth daily.   06/18/2017  . atorvastatin (LIPITOR) 10 MG tablet Take 1 tablet (10 mg total) by mouth daily. 90 tablet 3 Taking  . diphenoxylate-atropine (LOMOTIL) 2.5-0.025 MG tablet Take 1 tablet by mouth daily as needed for diarrhea or loose stools. 30 tablet 1 Taking  . glipiZIDE (GLUCOTROL XL) 5 MG 24 hr tablet Take 5 mg by mouth daily with breakfast.   Taking  . lisinopril-hydrochlorothiazide (PRINZIDE,ZESTORETIC) 20-12.5 MG tablet Take 2 tablets by mouth daily.   07/18/2017 at 0530  . metFORMIN (GLUCOPHAGE-XR) 750 MG 24 hr tablet Take 750 mg by mouth daily.   Taking  . mometasone (ELOCON) 0.1 % ointment Apply topically 2 (two) times daily.   Taking  . Multiple Vitamins-Minerals (CENTRUM SILVER PO) Take 1 tablet by mouth daily.   Taking     Allergies  Allergen Reactions  . Paxil [Paroxetine Hcl]     Weight loss and jittery  . Shellfish Allergy Swelling  . Latex Rash     Past Medical History:  Diagnosis Date  . Depression ~2010    situational and now better   . 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 GERD (gastroesophageal reflux disease)   . Hypertension   . IBS (irritable bowel syndrome)   . Osteoarthritis of multiple joints     Review of systems:      Physical Exam    Heart and lungs: Regular rate and rhythm without rub or gallop, lungs are bilaterally clear.    HEENT: Normocephalic atraumatic eyes are anicteric    Other:    Pertinant exam for procedure: Soft nontender nondistended bowel sounds positive normoactive.    Planned proceedures: Colonoscopy and indicated procedures. I have discussed the risks benefits and complications of procedures to include not limited to bleeding, infection, perforation and the risk of sedation and the patient wishes to proceed.    Frank Sails, MD Gastroenterology 07/18/2017  8:13 AM

## 2017-07-18 NOTE — Anesthesia Procedure Notes (Signed)
Performed by: Cook-Martin, Hersey Maclellan Pre-anesthesia Checklist: Patient identified, Emergency Drugs available, Suction available, Patient being monitored and Timeout performed Patient Re-evaluated:Patient Re-evaluated prior to induction Oxygen Delivery Method: Nasal cannula Preoxygenation: Pre-oxygenation with 100% oxygen Induction Type: IV induction Ventilation: Oral airway inserted - appropriate to patient size Placement Confirmation: positive ETCO2 and CO2 detector       

## 2017-07-19 ENCOUNTER — Encounter: Payer: Self-pay | Admitting: Gastroenterology

## 2017-07-19 LAB — SURGICAL PATHOLOGY

## 2017-08-07 ENCOUNTER — Other Ambulatory Visit: Payer: Self-pay | Admitting: Internal Medicine

## 2017-08-15 ENCOUNTER — Other Ambulatory Visit: Payer: Self-pay | Admitting: Internal Medicine

## 2017-08-15 NOTE — Telephone Encounter (Signed)
Copied from [place] [id number]. Topic: Quick Communication - Rx Refill/Question >> Aug 15, 2017  4:32 PM Sallee Provencal, [place] B, NT wrote: Medication: amLODipine (NORVASC) 5 MG tablet lisinopril-hydrochlorothiazide (PRINZIDE,ZESTORETIC) 20-12.5 MG tablet  Has the patient contacted their pharmacy? Yes.   (Agent: If no, request that the patient contact the pharmacy for the refill.) (Agent: If yes, when and what did the pharmacy advise?)  Preferred Pharmacy (with phone number or street name): [id number] - [place], [place]: Please be advised that RX refills may take up to 3 business days. We ask that you follow-up with your pharmacy.

## 2017-08-16 MED ORDER — AMLODIPINE BESYLATE 5 MG PO TABS: 5.0000 mg | ORAL_TABLET | Freq: Every day | ORAL | 0 refills | Status: DC

## 2017-08-16 MED ORDER — LISINOPRIL-HYDROCHLOROTHIAZIDE 20-12.5 MG PO TABS: 2.0000 | ORAL_TABLET | Freq: Every day | ORAL | 0 refills | Status: DC

## 2017-08-22 MED ORDER — AMLODIPINE BESYLATE 5 MG PO TABS: 5.0000 mg | ORAL_TABLET | Freq: Every day | ORAL | 0 refills | Status: DC

## 2017-08-22 MED ORDER — LISINOPRIL-HYDROCHLOROTHIAZIDE 20-12.5 MG PO TABS: 2.0000 | ORAL_TABLET | Freq: Every day | ORAL | 0 refills | Status: DC

## 2017-08-22 NOTE — Telephone Encounter (Signed)
Spoke to pt and advised Rx sent. Pt states he has enough meds to last until its arrival

## 2017-08-22 NOTE — Telephone Encounter (Addendum)
Relation to pt: self  Call back [place]: [place] [place] [phone] (Phone) [phone] (Fax)     Reason for call:  Spouse states mail order has faxed several request requesting refill for lisinopril-hydrochlorothiazide (PRINZIDE,ZESTORETIC) 20-12.5 MG tablet and amLODipine (NORVASC) 5 MG tablet 90 day supply.   [facility] and would like Rx sent today due today due to patient going out of town, please advise

## 2017-08-22 NOTE — Addendum Note (Signed)
Addended by: Modena Nunnery on: 08/22/2017 01:00 PM   Modules accepted: Orders

## 2017-08-30 DIAGNOSIS — B360 Pityriasis versicolor: Secondary | ICD-10-CM | POA: Diagnosis not present

## 2017-08-30 DIAGNOSIS — L821 Other seborrheic keratosis: Secondary | ICD-10-CM | POA: Diagnosis not present

## 2017-08-30 DIAGNOSIS — X32XXXA Exposure to sunlight, initial encounter: Secondary | ICD-10-CM | POA: Diagnosis not present

## 2017-08-30 DIAGNOSIS — L568 Other specified acute skin changes due to ultraviolet radiation: Secondary | ICD-10-CM | POA: Diagnosis not present

## 2017-11-08 ENCOUNTER — Encounter: Payer: Self-pay | Admitting: Internal Medicine

## 2017-11-08 ENCOUNTER — Ambulatory Visit (INDEPENDENT_AMBULATORY_CARE_PROVIDER_SITE_OTHER): Payer: Medicare Other | Admitting: Internal Medicine

## 2017-11-08 VITALS — BP 122/70 | HR 63 | Temp 97.8°F | Ht 68.0 in | Wt 226.0 lb

## 2017-11-08 DIAGNOSIS — Z0000 Encounter for general adult medical examination without abnormal findings: Secondary | ICD-10-CM

## 2017-11-08 DIAGNOSIS — Z23 Encounter for immunization: Secondary | ICD-10-CM

## 2017-11-08 DIAGNOSIS — F39 Unspecified mood [affective] disorder: Secondary | ICD-10-CM

## 2017-11-08 DIAGNOSIS — E1142 Type 2 diabetes mellitus with diabetic polyneuropathy: Secondary | ICD-10-CM

## 2017-11-08 DIAGNOSIS — M159 Polyosteoarthritis, unspecified: Secondary | ICD-10-CM

## 2017-11-08 DIAGNOSIS — I872 Venous insufficiency (chronic) (peripheral): Secondary | ICD-10-CM | POA: Diagnosis not present

## 2017-11-08 DIAGNOSIS — Z7189 Other specified counseling: Secondary | ICD-10-CM | POA: Diagnosis not present

## 2017-11-08 DIAGNOSIS — I10 Essential (primary) hypertension: Secondary | ICD-10-CM

## 2017-11-08 DIAGNOSIS — Z789 Other specified health status: Secondary | ICD-10-CM | POA: Diagnosis not present

## 2017-11-08 DIAGNOSIS — M150 Primary generalized (osteo)arthritis: Secondary | ICD-10-CM

## 2017-11-08 LAB — POCT GLYCOSYLATED HEMOGLOBIN (HGB A1C): HEMOGLOBIN A1C: 6.5 % — AB (ref 4.0–5.6)

## 2017-11-08 LAB — HM DIABETES FOOT EXAM

## 2017-11-08 NOTE — Progress Notes (Signed)
Hearing Screening (Inadequate exam)   Method: Audiometry   125Hz  250Hz  500Hz  1000Hz  2000Hz  3000Hz  4000Hz  6000Hz  8000Hz   Right ear:   0 0 0  0    Left ear:   40 0 40  0      Visual Acuity Screening   Right eye Left eye Both eyes  Without correction:     With correction: 20/25 20/20 20/20

## 2017-11-08 NOTE — Assessment & Plan Note (Signed)
Mild dysthymia No MDD Discussed purpose/volunteering, etc

## 2017-11-08 NOTE — Patient Instructions (Signed)
Please get your diabetic eye exam (and have them send me a report). If your leg swelling gets worse, let me know and we will consider trying a fluid pill.

## 2017-11-08 NOTE — Assessment & Plan Note (Signed)
Mild symptoms Wears support socks Consider low dose furosemide if worsens

## 2017-11-08 NOTE — Assessment & Plan Note (Signed)
BP Readings from Last 3 Encounters:  11/08/17 122/70  07/18/17 109/63  06/27/17 120/66   Good control Labs next time

## 2017-11-08 NOTE — Progress Notes (Signed)
Subjective:    Patient ID: Frank Vega, male    DOB: 1946/02/10, 72 y.o.   MRN: [id number]  HPI Here for Medicare wellness visit and follow up of chronic health conditions Reviewed form and advanced directives Reviewed other doctors No alcohol or tobacco Not really exercising--but stays busy Vision is fine Hearing is not great----considering hearing aide No falls Does have some mood problems. Doesn't feel motivated like in the past. Will have good days (like when he is volunteering). Just intermittent and not anhedonic. Some stress with wife--melanoma removed and some lung problems Independent with instrumental ADLs No sig memory problems  Still having trouble with legs Some pain mostly in calves--occasionally in the feet Some foot numbness--especially on the balls of feet Uses support socks intermittently (they will get uncomfortable) Right is still larger He felt the statin made his legs hurt more---better when he stopped it  Checks sugars intermittently--several times weekly 104-134 No low sugar reactions Overdue for eye exam  Current Outpatient Medications on File Prior to Visit  Medication Sig Dispense Refill  . amLODipine (NORVASC) 5 MG tablet Take 1 tablet (5 mg total) by mouth daily. 90 tablet 0  . aspirin 81 MG chewable tablet Chew 81 mg by mouth daily.    . diphenoxylate-atropine (LOMOTIL) 2.5-0.025 MG tablet Take 1 tablet by mouth daily as needed for diarrhea or loose stools. 30 tablet 1  . glipiZIDE (GLUCOTROL XL) 5 MG 24 hr tablet Take 5 mg by mouth daily with breakfast.    . lisinopril-hydrochlorothiazide (PRINZIDE,ZESTORETIC) 20-12.5 MG tablet Take 2 tablets by mouth daily. 180 tablet 0  . metFORMIN (GLUCOPHAGE-XR) 750 MG 24 hr tablet Take 1 tablet (750 mg total) by mouth daily with supper. 90 tablet 1  . mometasone (ELOCON) 0.1 % ointment Apply topically 2 (two) times daily.    . Multiple Vitamins-Minerals (CENTRUM SILVER PO) Take 1 tablet by mouth daily.       No current facility-administered medications on file prior to visit.     Allergies  Allergen Reactions  . Paxil [Paroxetine Hcl]     Weight loss and jittery  . Shellfish Allergy Swelling  . Latex Rash    Past Medical History:  Diagnosis Date  . 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 Diabetes mellitus with polyneuropathy ([place])   . GERD (gastroesophageal reflux disease)   . Hypertension   . IBS (irritable bowel syndrome)   . Osteoarthritis of multiple joints     Past Surgical History:  Procedure Laterality Date  . COLONOSCOPY WITH PROPOFOL N/A 07/18/2017   Procedure: COLONOSCOPY WITH PROPOFOL;  Surgeon: Lollie Sails, MD;  Location: [facility] ENDOSCOPY;  Service: Endoscopy;  Laterality: N/A;  . CYST EXCISION  1952   left leg  . KNEE ARTHROSCOPY Left 2011  . REPLACEMENT TOTAL KNEE Left   . TOTAL HIP ARTHROPLASTY Right     Family History  Problem Relation Age of Onset  . Arthritis Mother   . Hypertension Mother   . Dementia Mother   . Heart attack Father   . Heart disease Father   . Cancer Father        lymphoma    Social History   Socioeconomic History  . Marital status: Married    Spouse name: Not on file  . Number of children: 1  . Years of education: Not on file  . Highest education level: Not on file  Occupational History  . 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 Financial resource strain: Not on file  . Food insecurity:    Worry: Not on file    Inability: Not on file  . Transportation needs:    Medical: Not on file    Non-medical: Not on file  Tobacco Use  . Smoking status: Never Smoker  . Smokeless tobacco: Never Used  Substance and Sexual Activity  . Alcohol use: No    Frequency: Never  . Drug use: No  . Sexual activity: Never  Lifestyle  . Physical activity:    Days per week: Not on file    Minutes per session: Not on file  . Stress: Not on file  Relationships  . Social connections:     Talks on phone: Not on file    Gets together: Not on file    Attends religious service: Not on file    Active member of club or organization: Not on file    Attends meetings of clubs or organizations: Not on file    Relationship status: Not on file  . Intimate partner violence:    Fear of current or ex partner: Not on file    Emotionally abused: Not on file    Physically abused: Not on file    Forced sexual activity: Not on file  Other Topics Concern  . Not on file  Social History Narrative   1 daughter--local      No living will   Wife, then daughter, should make health care decisions   Would accept resuscitation   Not sure about tube feeds   Review of Systems  Appetite is fine Weight stable Chronic knee pain and surgeries--uses tylenol prn ED--misses having sex Wears seat belt Tooth replaced in bridge--otherwise okay No heartburn--- rare swallowing isues. No meds Recent colonoscopy--- 1 precancerous polyp. Bowels are fine Voids okay. Stream is okay---empties okay. Slow in AM only     Objective:   Physical Exam  Constitutional: He is oriented to person, place, and time. He appears well-developed. No distress.  HENT:  Mouth/Throat: Oropharynx is clear and moist. No oropharyngeal exudate.  Neck: No thyromegaly present.  Cardiovascular: Normal rate, regular rhythm, normal heart sounds and intact distal pulses. Exam reveals no gallop.  No murmur heard. Respiratory: Effort normal and breath sounds normal. No respiratory distress. He has no wheezes. He has no rales.  GI: Soft. There is no tenderness.  Musculoskeletal: He exhibits no tenderness.  Slight fullness in calves No tenderness  Lymphadenopathy:    He has no cervical adenopathy.  Neurological: He is alert and oriented to person, place, and time.  President--- "Daisy Floro, Obama, Bush" [phone] D-l-r-[id number]-w Recall 3/3  Fairly normal sensation on plantar feet  Skin: No rash noted.  No foot lesions   Psychiatric: He has a normal mood and affect. His behavior is normal.           Assessment & Plan:

## 2017-11-08 NOTE — Addendum Note (Signed)
Addended by: Pilar Grammes on: 11/08/2017 11:31 AM   Modules accepted: Orders

## 2017-11-08 NOTE — Assessment & Plan Note (Signed)
Various spots Doesn't generally take meds

## 2017-11-08 NOTE — Assessment & Plan Note (Signed)
Hasn't tolerated and doesn't want to retry

## 2017-11-08 NOTE — Assessment & Plan Note (Signed)
See social history 

## 2017-11-08 NOTE — Assessment & Plan Note (Addendum)
Still seems to have good control Mild neuropathy only---no meds needed  Lab Results  Component Value Date   HGBA1C 6.5 (A) 11/08/2017   Good control

## 2017-11-08 NOTE — Assessment & Plan Note (Signed)
I have personally reviewed the Medicare Annual Wellness questionnaire and have noted 1. The patient's medical and social history 2. Their use of alcohol, tobacco or illicit drugs 3. Their current medications and supplements 4. The patient's functional ability including ADL's, fall risks, home safety risks and hearing or visual             impairment. 5. Diet and physical activities 6. Evidence for depression or mood disorders  The patients weight, height, BMI and visual acuity have been recorded in the chart I have made referrals, counseling and provided education to the patient based review of the above and I have provided the pt with a written personalized care plan for preventive services.  I have provided you with a copy of your personalized plan for preventive services. Please take the time to review along with your updated medication list.  Will give prevnar and flu vaccines Discussed fitness No PSA due to age Will need another colon is about 5 years Consider shingrix

## 2017-11-20 ENCOUNTER — Other Ambulatory Visit: Payer: Self-pay | Admitting: Internal Medicine

## 2017-12-26 DIAGNOSIS — E119 Type 2 diabetes mellitus without complications: Secondary | ICD-10-CM | POA: Diagnosis not present

## 2017-12-26 LAB — HM DIABETES EYE EXAM

## 2017-12-29 ENCOUNTER — Encounter: Payer: Self-pay | Admitting: Internal Medicine

## 2018-02-09 ENCOUNTER — Other Ambulatory Visit: Payer: Self-pay | Admitting: Internal Medicine

## 2018-02-23 ENCOUNTER — Ambulatory Visit (INDEPENDENT_AMBULATORY_CARE_PROVIDER_SITE_OTHER): Payer: Medicare Other | Admitting: Family Medicine

## 2018-02-23 ENCOUNTER — Encounter: Payer: Self-pay | Admitting: Family Medicine

## 2018-02-23 VITALS — BP 136/68 | HR 73 | Temp 98.5°F | Ht 69.0 in | Wt 225.2 lb

## 2018-02-23 DIAGNOSIS — E1142 Type 2 diabetes mellitus with diabetic polyneuropathy: Secondary | ICD-10-CM | POA: Diagnosis not present

## 2018-02-23 DIAGNOSIS — M79671 Pain in right foot: Secondary | ICD-10-CM | POA: Diagnosis not present

## 2018-02-23 DIAGNOSIS — R609 Edema, unspecified: Secondary | ICD-10-CM

## 2018-02-23 MED ORDER — GABAPENTIN 100 MG PO CAPS: 100.0000 mg | ORAL_CAPSULE | Freq: Every day | ORAL | 1 refills | Status: DC

## 2018-02-23 NOTE — Progress Notes (Signed)
Subjective:    Patient ID: Frank Vega, male    DOB: March 06, 1946, 72 y.o.   MRN: [id number]  HPI  Presents to clinic c/o swollen feet with numbness. Has hx of PVD and also polyneuropathy related to diabetes.   Patient states he has been dealing with this off and on for at least 6 months.  Patient notices the swelling in his feet because there are indentations from his sock.  Patient states the numbness in feet will come off and on, noticed it more so in the right ball of his foot over the pas many weeks.  States that when he is driving at times, he feels like he can sense the gas pedal as well as he normally would have.  Patient Active Problem List   Diagnosis Date Noted  . 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 Preventative health care 11/08/2017  . Advance directive discussed with patient 11/08/2017  . 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 Chronic venous insufficiency 06/27/2017  . 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 Hypertension   . 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 IBS (irritable bowel syndrome)    Social History   Tobacco Use  . Smoking status: Never Smoker  . Smokeless tobacco: Never Used  Substance Use Topics  . Alcohol use: No    Frequency: Never    Review of Systems  Constitutional: Negative for chills, fatigue and fever.  HENT: Negative for congestion, ear pain, sinus pain and sore throat.   Eyes: Negative.   Respiratory: Negative for cough, shortness of breath and wheezing.   Cardiovascular: Negative for chest pain, palpitations and leg swelling.  Gastrointestinal: Negative for abdominal pain, diarrhea, nausea and vomiting.  Genitourinary: Negative for dysuria, frequency and urgency.  Musculoskeletal: Negative for arthralgias and myalgias.  Skin: Negative for color change, pallor and rash.  Neurological: Negative for syncope, light-headedness and headaches. +pain on bottoms of feet, more so right  ball of foot.  Psychiatric/Behavioral: The patient is not nervous/anxious.       Objective:   Physical Exam Vitals signs and nursing note reviewed.  Constitutional:      General: He is not in acute distress.    Appearance: Normal appearance.  HENT:     Head: Normocephalic and atraumatic.  Eyes:     General: No scleral icterus.    Extraocular Movements: Extraocular movements intact.     Conjunctiva/sclera: Conjunctivae normal.  Neck:     Musculoskeletal: Normal range of motion and neck supple.  Cardiovascular:     Rate and Rhythm: Normal rate and regular rhythm.     Pulses: Normal pulses.          Dorsalis pedis pulses are 2+ on the right side and 2+ on the left side.  Pulmonary:     Effort: Pulmonary effort is normal. No respiratory distress.     Breath sounds: Normal breath sounds.  Musculoskeletal:     Right foot: Normal range of motion. No deformity.     Left foot: Normal range of motion. No deformity.  Feet:     Right foot:     Protective Sensation: 5 sites tested. 5 sites sensed.     Skin integrity: Skin integrity normal.     Left foot:     Protective Sensation: 5 sites tested. 5 sites sensed.     Skin integrity: Skin integrity normal.     Comments: Trace edema bilateral tops of feet (indent from sock on  tops of feet). States the sites tested on his toes and right heel --> he could feel the monofilament tip but not as well as he could feel it on the balls of feet. Skin:    General: Skin is warm and dry.     Coloration: Skin is not jaundiced or pale.     Findings: No bruising, erythema, lesion or rash.  Neurological:     Mental Status: He is alert and oriented to person, place, and time.     Gait: Gait normal.       Vitals:   02/23/18 1344  BP: 136/68  Pulse: 73  Temp: 98.5 F (36.9 C)  SpO2: 95%   Assessment & Plan:    A total of 25  minutes were spent face-to-face with the patient during this encounter and over half of that time was spent on counseling  and coordination of care. The patient was counseled on what the most likely cause of his foot pain/numbness is from, why I would not add any extra fluid pills onto his medication regimen.   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  Patient states he and his PCP have discussed this in the past, but current time he does not take any medications for this.  Patient is agreeable to begin very low-dose gabapentin 100 mg once per day at bedtime and monitor how his symptoms respond.  Recommended patient wear diabetic socks and good supportive shoes.    Trace edema - very very minimal swelling on bilateral feet visible due to slight indentation in skin from patient's socks.  Advised patient that at this level of edema, I would not add on any sort of fluid pill. He is on lisinopril-HCTZ already.   Patient will follow-up with PCP in approximately 1 month to see if adding gabapentin has made any sort of difference in the neuropathy in his feet.

## 2018-02-26 ENCOUNTER — Telehealth: Payer: Self-pay | Admitting: *Deleted

## 2018-02-26 NOTE — Telephone Encounter (Signed)
Let him know that it is okay to try this for his diabetic nerve pain (it is what I would prescribe). I favor taking it only at night at first--and adding daytime doses only if the pain remains bad

## 2018-02-26 NOTE — Telephone Encounter (Signed)
Spoke to pt

## 2018-02-26 NOTE — Telephone Encounter (Signed)
Spoke to pt who states he was seen at [facility] on 12/20 and prescribed Gabapentin. He has been reading online, as well as the insert included when he received the Rx discussing the side effects and he is wanting to verify with Dr Silvio Pate, it was ok for him to take before doing so. pls advise

## 2018-03-23 ENCOUNTER — Other Ambulatory Visit: Payer: Self-pay | Admitting: *Deleted

## 2018-03-23 MED ORDER — GLIPIZIDE ER 5 MG PO TB24: 5.0000 mg | ORAL_TABLET | Freq: Every day | ORAL | 3 refills | Status: DC

## 2018-03-23 NOTE — Telephone Encounter (Signed)
Spoke to pt who is requesting refill of glipizide. Rx written by previous provider. Rx can be sent to [place]. Last OV 11/2017. pls advise

## 2018-03-26 ENCOUNTER — Ambulatory Visit: Payer: Medicare Other | Admitting: Internal Medicine

## 2018-04-21 ENCOUNTER — Other Ambulatory Visit: Payer: Self-pay | Admitting: Family Medicine

## 2018-04-21 DIAGNOSIS — E1142 Type 2 diabetes mellitus with diabetic polyneuropathy: Secondary | ICD-10-CM

## 2018-04-21 DIAGNOSIS — M79671 Pain in right foot: Secondary | ICD-10-CM

## 2018-04-24 NOTE — Telephone Encounter (Signed)
Pt said he has not been taking it. He was afraid of the side effects. Thinks it may have been his shoes being tight. He bought some new orthopedic shoes. The right foot is still swelling.

## 2018-04-24 NOTE — Telephone Encounter (Signed)
Please check with him about this I have no problems refilling this--just see if the one at bedtime is controlling his symptoms If not we will have to try titrating If he is satisfied, refill x 1 year

## 2018-05-16 ENCOUNTER — Encounter: Payer: Self-pay | Admitting: Internal Medicine

## 2018-05-16 ENCOUNTER — Other Ambulatory Visit: Payer: Self-pay

## 2018-05-16 ENCOUNTER — Ambulatory Visit (INDEPENDENT_AMBULATORY_CARE_PROVIDER_SITE_OTHER): Payer: Medicare Other | Admitting: Internal Medicine

## 2018-05-16 VITALS — BP 132/72 | HR 75 | Temp 97.7°F | Ht 69.0 in | Wt 225.0 lb

## 2018-05-16 DIAGNOSIS — E1142 Type 2 diabetes mellitus with diabetic polyneuropathy: Secondary | ICD-10-CM | POA: Diagnosis not present

## 2018-05-16 DIAGNOSIS — M7500 Adhesive capsulitis of unspecified shoulder: Secondary | ICD-10-CM | POA: Insufficient documentation

## 2018-05-16 DIAGNOSIS — M7501 Adhesive capsulitis of right shoulder: Secondary | ICD-10-CM | POA: Diagnosis not present

## 2018-05-16 DIAGNOSIS — R609 Edema, unspecified: Secondary | ICD-10-CM | POA: Insufficient documentation

## 2018-05-16 DIAGNOSIS — I10 Essential (primary) hypertension: Secondary | ICD-10-CM

## 2018-05-16 LAB — POCT GLYCOSYLATED HEMOGLOBIN (HGB A1C): Hemoglobin A1C: 7 % — AB (ref 4.0–5.6)

## 2018-05-16 MED ORDER — ATORVASTATIN CALCIUM 10 MG PO TABS: 10.0000 mg | ORAL_TABLET | ORAL | 3 refills | Status: DC

## 2018-05-16 NOTE — Progress Notes (Signed)
Subjective:    Patient ID: Frank Vega, male    DOB: August 24, 1945, 73 y.o.   MRN: [id number]  HPI Here for follow up of diabetes  Ongoing problems with his feet Right leg swells--but not the left---and notices it some in AM (only increases as the day goes on some of the time) Not really painful--some tingling Has noted decreased sensation ---when trying to drive Some balance issues when just standing Does have support socks  Checks sugars every couple of days Usually 112-125 Occasionally in 80's---he will get urge to eat  Some issues with right shoulder Trouble reaching above his head Some pain--if holding heavy object  Current Outpatient Medications on File Prior to Visit  Medication Sig Dispense Refill  . amLODipine (NORVASC) 5 MG tablet TAKE 1 TABLET BY MOUTH  DAILY 90 tablet 3  . aspirin 81 MG chewable tablet Chew 81 mg by mouth daily.    . diphenoxylate-atropine (LOMOTIL) 2.5-0.025 MG tablet Take 1 tablet by mouth daily as needed for diarrhea or loose stools. 30 tablet 1  . glipiZIDE (GLUCOTROL XL) 5 MG 24 hr tablet Take 1 tablet (5 mg total) by mouth daily with breakfast. 90 tablet 3  . lisinopril-hydrochlorothiazide (PRINZIDE,ZESTORETIC) 20-12.5 MG tablet TAKE 2 TABLETS BY MOUTH  DAILY 180 tablet 3  . metFORMIN (GLUCOPHAGE-XR) 750 MG 24 hr tablet TAKE 1 TABLET (750 MG TOTAL) BY MOUTH DAILY WITH SUPPER. 90 tablet 3  . mometasone (ELOCON) 0.1 % ointment Apply topically 2 (two) times daily.    . Multiple Vitamins-Minerals (CENTRUM SILVER PO) Take 1 tablet by mouth daily.     No current facility-administered medications on file prior to visit.     Allergies  Allergen Reactions  . Paxil [Paroxetine Hcl]     Weight loss and jittery  . Shellfish Allergy Swelling  . Latex Rash    Past Medical History:  Diagnosis Date  . 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 Diabetes mellitus with polyneuropathy ([place])   . GERD (gastroesophageal reflux disease)   .  Hypertension   . IBS (irritable bowel syndrome)   . Osteoarthritis of multiple joints     Past Surgical History:  Procedure Laterality Date  . COLONOSCOPY WITH PROPOFOL N/A 07/18/2017   Procedure: COLONOSCOPY WITH PROPOFOL;  Surgeon: Lollie Sails, MD;  Location: [facility] ENDOSCOPY;  Service: Endoscopy;  Laterality: N/A;  . CYST EXCISION  1952   left leg  . KNEE ARTHROSCOPY Left 2011  . REPLACEMENT TOTAL KNEE Left   . TOTAL HIP ARTHROPLASTY Right     Family History  Problem Relation Age of Onset  . Arthritis Mother   . Hypertension Mother   . Dementia Mother   . Heart attack Father   . Heart disease Father   . Cancer Father        lymphoma    Social History   Socioeconomic History  . Marital status: Married    Spouse name: Not on file  . Number of children: 1  . Years of education: Not on file  . Highest education level: Not on file  Occupational History  . 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 Financial resource strain: Not on file  . Food insecurity:    Worry: Not on file    Inability: Not on file  . Transportation needs:    Medical: Not on file    Non-medical: Not on file  Tobacco Use  . Smoking  status: Never Smoker  . Smokeless tobacco: Never Used  Substance and Sexual Activity  . Alcohol use: No    Frequency: Never  . Drug use: No  . Sexual activity: Never  Lifestyle  . Physical activity:    Days per week: Not on file    Minutes per session: Not on file  . Stress: Not on file  Relationships  . Social connections:    Talks on phone: Not on file    Gets together: Not on file    Attends religious service: Not on file    Active member of club or organization: Not on file    Attends meetings of clubs or organizations: Not on file    Relationship status: Not on file  . Intimate partner violence:    Fear of current or ex partner: Not on file    Emotionally abused: Not on file    Physically abused: Not on file     Forced sexual activity: Not on file  Other Topics Concern  . Not on file  Social History Narrative   1 daughter--local      No living will   Wife, then daughter, should make health care decisions   Would accept resuscitation   Not sure about tube feeds   Review of Systems Appetite is fine Weight stable Not sleeping great---awakens for voiding once    Objective:   Physical Exam  Constitutional: He appears well-developed. No distress.  Neck: No thyromegaly present.  Cardiovascular: Normal rate, regular rhythm, normal heart sounds and intact distal pulses. Exam reveals no gallop.  No murmur heard. Respiratory: Effort normal and breath sounds normal. No respiratory distress. He has no wheezes. He has no rales.  Musculoskeletal:     Comments: At most trace edema in right foot Decreased abduction and rotation of right shoulder  Lymphadenopathy:    He has no cervical adenopathy.  Neurological:  Slight sensory changes in feet  Skin:  No foot lesions           Assessment & Plan:

## 2018-05-16 NOTE — Assessment & Plan Note (Signed)
Likely mild venous insufficiency

## 2018-05-16 NOTE — Assessment & Plan Note (Signed)
Discussed ROM exercises, etc

## 2018-05-16 NOTE — Assessment & Plan Note (Signed)
BP Readings from Last 3 Encounters:  05/16/18 132/72  02/23/18 136/68  11/08/17 122/70   Acceptable control

## 2018-05-16 NOTE — Assessment & Plan Note (Signed)
Lab Results  Component Value Date   HGBA1C 7.0 (A) 05/16/2018   Still acceptable control Discussed lifestyle Mild neuropathy--no Rx needed

## 2018-08-14 ENCOUNTER — Other Ambulatory Visit: Payer: Self-pay | Admitting: Internal Medicine

## 2018-08-14 NOTE — Telephone Encounter (Signed)
Last filled 05-23-17 #30 Last OV 05-16-18 No Future OV [facility] [address]

## 2018-09-04 DIAGNOSIS — Z85828 Personal history of other malignant neoplasm of skin: Secondary | ICD-10-CM | POA: Diagnosis not present

## 2018-09-04 DIAGNOSIS — B360 Pityriasis versicolor: Secondary | ICD-10-CM | POA: Diagnosis not present

## 2018-09-04 DIAGNOSIS — Z08 Encounter for follow-up examination after completed treatment for malignant neoplasm: Secondary | ICD-10-CM | POA: Diagnosis not present

## 2018-09-04 DIAGNOSIS — L570 Actinic keratosis: Secondary | ICD-10-CM | POA: Diagnosis not present

## 2018-09-20 ENCOUNTER — Other Ambulatory Visit: Payer: Self-pay | Admitting: Internal Medicine

## 2018-10-18 ENCOUNTER — Ambulatory Visit: Payer: Medicare Other | Admitting: Family Medicine

## 2018-10-19 ENCOUNTER — Ambulatory Visit (INDEPENDENT_AMBULATORY_CARE_PROVIDER_SITE_OTHER): Payer: Medicare Other | Admitting: Internal Medicine

## 2018-10-19 ENCOUNTER — Other Ambulatory Visit: Payer: Self-pay

## 2018-10-19 ENCOUNTER — Ambulatory Visit (INDEPENDENT_AMBULATORY_CARE_PROVIDER_SITE_OTHER)
Admission: RE | Admit: 2018-10-19 | Discharge: 2018-10-19 | Disposition: A | Discharge status: Home / Self Care | Payer: Medicare Other | Source: Ambulatory Visit | Attending: Internal Medicine | Admitting: Internal Medicine

## 2018-10-19 ENCOUNTER — Encounter: Payer: Self-pay | Admitting: Internal Medicine

## 2018-10-19 DIAGNOSIS — M5441 Lumbago with sciatica, right side: Secondary | ICD-10-CM | POA: Diagnosis not present

## 2018-10-19 DIAGNOSIS — M5442 Lumbago with sciatica, left side: Secondary | ICD-10-CM

## 2018-10-19 DIAGNOSIS — M48062 Spinal stenosis, lumbar region with neurogenic claudication: Secondary | ICD-10-CM

## 2018-10-19 DIAGNOSIS — R21 Rash and other nonspecific skin eruption: Secondary | ICD-10-CM | POA: Insufficient documentation

## 2018-10-19 DIAGNOSIS — M2578 Osteophyte, vertebrae: Secondary | ICD-10-CM | POA: Diagnosis not present

## 2018-10-19 DIAGNOSIS — M4316 Spondylolisthesis, lumbar region: Secondary | ICD-10-CM | POA: Diagnosis not present

## 2018-10-19 DIAGNOSIS — I10 Essential (primary) hypertension: Secondary | ICD-10-CM | POA: Diagnosis not present

## 2018-10-19 DIAGNOSIS — N471 Phimosis: Secondary | ICD-10-CM

## 2018-10-19 DIAGNOSIS — E1142 Type 2 diabetes mellitus with diabetic polyneuropathy: Secondary | ICD-10-CM

## 2018-10-19 DIAGNOSIS — M549 Dorsalgia, unspecified: Secondary | ICD-10-CM | POA: Insufficient documentation

## 2018-10-19 DIAGNOSIS — M47816 Spondylosis without myelopathy or radiculopathy, lumbar region: Secondary | ICD-10-CM | POA: Diagnosis not present

## 2018-10-19 DIAGNOSIS — I739 Peripheral vascular disease, unspecified: Secondary | ICD-10-CM | POA: Insufficient documentation

## 2018-10-19 LAB — COMPREHENSIVE METABOLIC PANEL
ALT: 27 U/L (ref 0–53)
AST: 21 U/L (ref 0–37)
Albumin: 4.3 g/dL (ref 3.5–5.2)
Alkaline Phosphatase: 66 U/L (ref 39–117)
BUN: 17 mg/dL (ref 6–23)
CO2: 30 mEq/L (ref 19–32)
Calcium: 9.6 mg/dL (ref 8.4–10.5)
Chloride: 101 mEq/L (ref 96–112)
Creatinine, Ser: 0.87 mg/dL (ref 0.40–1.50)
GFR: 85.94 mL/min (ref >60.00)
Glucose, Bld: 142 mg/dL — ABNORMAL HIGH (ref 70–99)
Potassium: 4.4 mEq/L (ref 3.5–5.1)
Sodium: 138 mEq/L (ref 135–145)
Total Bilirubin: 0.8 mg/dL (ref 0.2–1.2)
Total Protein: 7.1 g/dL (ref 6.0–8.3)

## 2018-10-19 LAB — CBC
HCT: 41.4 % (ref 39.0–52.0)
Hemoglobin: 13.8 g/dL (ref 13.0–17.0)
MCHC: 33.4 g/dL (ref 30.0–36.0)
MCV: 95.4 fl (ref 78.0–100.0)
Platelets: 269 10*3/uL (ref 150.0–400.0)
RBC: 4.34 Mil/uL (ref 4.22–5.81)
RDW: 13.7 % (ref 11.5–15.5)
WBC: 10 10*3/uL (ref 4.0–10.5)

## 2018-10-19 LAB — LIPID PANEL
Cholesterol: 198 mg/dL (ref 0–200)
HDL: 37.2 mg/dL — ABNORMAL LOW (ref >39.00)
NonHDL: 160.75
Total CHOL/HDL Ratio: 5
Triglycerides: 396 mg/dL — ABNORMAL HIGH (ref 0.0–149.0)
VLDL: 79.2 mg/dL — ABNORMAL HIGH (ref 0.0–40.0)

## 2018-10-19 LAB — HEMOGLOBIN A1C: Hgb A1c MFr Bld: 7.3 % — ABNORMAL HIGH (ref 4.6–6.5)

## 2018-10-19 LAB — LDL CHOLESTEROL, DIRECT: Direct LDL: 94 mg/dL

## 2018-10-19 MED ORDER — TRIAMCINOLONE ACETONIDE 0.1 % EX CREA: 1.0000 "application " | TOPICAL_CREAM | Freq: Two times a day (BID) | CUTANEOUS | 1 refills | Status: DC | PRN

## 2018-10-19 NOTE — Progress Notes (Signed)
Subjective:    Patient ID: Frank Vega, male    DOB: December 17, 1945, 73 y.o.   MRN: [id number]  HPI Here mostly due to back pain  Feels pain in lumbar back--mostly if lifting or doing something strenuous Points over spine in mid lumbar area Tends to be worse in the morning--"feels like a fist against it" Bothered in his legs also--especially with steps, activity Hard to mow yard--riding mower (but then the trimming) Rest does help the symptoms Does notice weakness in his legs---trouble carrying things up steps  Doesn't exercise---but daughter could help her  Abnormal sensation in feet---about the same Does have some numbness in hands as well  Checks sugars most days Always under 120 No hypoglycemic reactions  Has spot on back by shoulder he wants checked Using [employer]  Having trouble with COVID and staying isolated  Current Outpatient Medications on File Prior to Visit  Medication Sig Dispense Refill  . amLODipine (NORVASC) 5 MG tablet TAKE 1 TABLET BY MOUTH  DAILY 90 tablet 3  . aspirin 81 MG chewable tablet Chew 81 mg by mouth daily.    Marland Kitchen atorvastatin (LIPITOR) 10 MG tablet Take 1 tablet (10 mg total) by mouth once a week. 13 tablet 3  . diphenoxylate-atropine (LOMOTIL) 2.5-0.025 MG tablet TAKE 1 TABLET BY MOUTH DAILY AS NEEDED FOR DIARRHEA OR LOOSE STOOLS. 30 tablet 0  . glipiZIDE (GLUCOTROL XL) 5 MG 24 hr tablet Take 1 tablet (5 mg total) by mouth daily with breakfast. 90 tablet 3  . lisinopril-hydrochlorothiazide (ZESTORETIC) 20-12.5 MG tablet TAKE 2 TABLETS BY MOUTH  DAILY 180 tablet 3  . metFORMIN (GLUCOPHAGE-XR) 750 MG 24 hr tablet TAKE 1 TABLET (750 MG TOTAL) BY MOUTH DAILY WITH SUPPER. 90 tablet 3  . mometasone (ELOCON) 0.1 % ointment Apply topically 2 (two) times daily.    . Multiple Vitamins-Minerals (MULTIVITAMIN MEN PO) Take by mouth.    Marland Kitchen OVER THE COUNTER MEDICATION Ultimate Man-T     No current facility-administered medications on file prior to visit.      Allergies  Allergen Reactions  . Paxil [Paroxetine Hcl]     Weight loss and jittery  . Shellfish Allergy Swelling  . Latex Rash    Past Medical History:  Diagnosis Date  . 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 Diabetes mellitus with polyneuropathy ([place])   . GERD (gastroesophageal reflux disease)   . Hypertension   . IBS (irritable bowel syndrome)   . Osteoarthritis of multiple joints     Past Surgical History:  Procedure Laterality Date  . COLONOSCOPY WITH PROPOFOL N/A 07/18/2017   Procedure: COLONOSCOPY WITH PROPOFOL;  Surgeon: Lollie Sails, MD;  Location: [facility] ENDOSCOPY;  Service: Endoscopy;  Laterality: N/A;  . CYST EXCISION  1952   left leg  . KNEE ARTHROSCOPY Left 2011  . REPLACEMENT TOTAL KNEE Left   . TOTAL HIP ARTHROPLASTY Right     Family History  Problem Relation Age of Onset  . Arthritis Mother   . Hypertension Mother   . Dementia Mother   . Heart attack Father   . Heart disease Father   . Cancer Father        lymphoma    Social History   Socioeconomic History  . Marital status: Married    Spouse name: Not on file  . Number of children: 1  . Years of education: Not on file  . Highest education level: Not on file  Occupational History  .  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 Financial resource strain: Not on file  . Food insecurity    Worry: Not on file    Inability: Not on file  . Transportation needs    Medical: Not on file    Non-medical: Not on file  Tobacco Use  . Smoking status: Never Smoker  . Smokeless tobacco: Never Used  Substance and Sexual Activity  . Alcohol use: No    Frequency: Never  . Drug use: No  . Sexual activity: Never  Lifestyle  . Physical activity    Days per week: Not on file    Minutes per session: Not on file  . Stress: Not on file  Relationships  . Social Herbalist on phone: Not on file    Gets together: Not on file    Attends  religious service: Not on file    Active member of club or organization: Not on file    Attends meetings of clubs or organizations: Not on file    Relationship status: Not on file  . Intimate partner violence    Fear of current or ex partner: Not on file    Emotionally abused: Not on file    Physically abused: Not on file    Forced sexual activity: Not on file  Other Topics Concern  . Not on file  Social History Narrative   1 daughter--local      No living will   Wife, then daughter, should make health care decisions   Would accept resuscitation   Not sure about tube feeds   Review of Systems Some right leg swelling--no change Sleep is not great---"I sleep in bursts" (like 4 hours) Occasional 30 minute afternoon nap Notes phimosis---works on trying to keep it clean    Objective:   Physical Exam  Constitutional: He appears well-developed. No distress.  Neck: No thyromegaly present.  Cardiovascular: Normal rate, regular rhythm and normal heart sounds. Exam reveals no gallop.  No murmur heard. Faint pedal pulses  Respiratory: Effort normal and breath sounds normal. No respiratory distress. He has no wheezes. He has no rales.  Musculoskeletal:     Comments: Ankles are full--but no pitting or clear edema No back tenderness  Lymphadenopathy:    He has no cervical adenopathy.  Neurological:  No leg weakness  Skin:  Slightly red patches along lateral upper left back---not infectious  Psychiatric: He has a normal mood and affect. His behavior is normal.           Assessment & Plan:

## 2018-10-19 NOTE — Assessment & Plan Note (Signed)
I strongly suspect spinal stenosis Will just check plain x-ray to be sure no occult findings Might consider surgery--will ask him to work on fitness He might be better if he works on leg strength and core Consider CT if persistent issues

## 2018-10-19 NOTE — Assessment & Plan Note (Signed)
Seems to still have good control Will check labs

## 2018-10-19 NOTE — Assessment & Plan Note (Signed)
Vascular study fine 2/19

## 2018-10-19 NOTE — Patient Instructions (Signed)
You need to work daily on leg strength and core work!!

## 2018-10-19 NOTE — Assessment & Plan Note (Signed)
Urology referral if considering circumcision

## 2018-10-19 NOTE — Assessment & Plan Note (Signed)
BP Readings from Last 3 Encounters:  10/19/18 124/76  05/16/18 132/72  02/23/18 136/68   Good control

## 2018-10-19 NOTE — Assessment & Plan Note (Signed)
Looks eczematous Will try TAC

## 2018-12-31 DIAGNOSIS — E119 Type 2 diabetes mellitus without complications: Secondary | ICD-10-CM | POA: Diagnosis not present

## 2018-12-31 LAB — HM DIABETES EYE EXAM

## 2019-01-15 ENCOUNTER — Encounter: Payer: Self-pay | Admitting: Internal Medicine

## 2019-01-16 DIAGNOSIS — Z23 Encounter for immunization: Secondary | ICD-10-CM | POA: Diagnosis not present

## 2019-02-14 ENCOUNTER — Other Ambulatory Visit: Payer: Self-pay | Admitting: Internal Medicine

## 2019-03-03 ENCOUNTER — Other Ambulatory Visit: Payer: Self-pay | Admitting: Internal Medicine

## 2019-04-16 ENCOUNTER — Telehealth: Payer: Self-pay

## 2019-04-16 NOTE — Telephone Encounter (Signed)
Patient had partial and a crown fall out this morning, and he is needing some dental work done. He has a dental appointment tomorrow morning and he is asking that 4 tablets of amoxicillin be sent in prior to this in case they decided to do any dental work? He states his dentist always requires he takes amoxicillin prior to dental work and he would like this sent in just in case.  [place].

## 2019-04-17 MED ORDER — AMOXICILLIN 500 MG PO CAPS: ORAL_CAPSULE | ORAL | 2 refills | Status: DC

## 2019-04-17 MED ORDER — AMOXICILLIN 500 MG PO CAPS: ORAL_CAPSULE | ORAL | 2 refills | Status: AC

## 2019-04-17 NOTE — Telephone Encounter (Signed)
I called the wife back to find out which [facility] [address] as there are 2. I [facility] the [facility] and [facility].

## 2019-04-17 NOTE — Telephone Encounter (Signed)
Rx sent electronically. Advised pt via VM per [facility]

## 2019-04-17 NOTE — Addendum Note (Signed)
Addended by: Pilar Grammes on: 04/17/2019 04:10 PM   Modules accepted: Orders

## 2019-04-17 NOTE — Telephone Encounter (Signed)
Spouse called back  She needs [facility] [address] as requested yesterday Please advise when this has been done  Best number [phone]

## 2019-04-17 NOTE — Telephone Encounter (Signed)
Okay to send amoxicillin 500mg   #4 x 2 Take all 4 about 1 hour before dental work

## 2019-05-13 ENCOUNTER — Other Ambulatory Visit: Payer: Self-pay

## 2019-05-13 ENCOUNTER — Encounter: Payer: Self-pay | Admitting: Family Medicine

## 2019-05-13 ENCOUNTER — Ambulatory Visit (INDEPENDENT_AMBULATORY_CARE_PROVIDER_SITE_OTHER): Payer: Medicare Other | Admitting: Family Medicine

## 2019-05-13 ENCOUNTER — Ambulatory Visit (INDEPENDENT_AMBULATORY_CARE_PROVIDER_SITE_OTHER)
Admission: RE | Admit: 2019-05-13 | Discharge: 2019-05-13 | Disposition: A | Discharge status: Home / Self Care | Payer: Medicare Other | Source: Ambulatory Visit | Attending: Family Medicine | Admitting: Family Medicine

## 2019-05-13 VITALS — BP 138/60 | HR 94 | Temp 98.3°F | Ht 69.0 in | Wt 226.8 lb

## 2019-05-13 DIAGNOSIS — M25511 Pain in right shoulder: Secondary | ICD-10-CM

## 2019-05-13 DIAGNOSIS — H9193 Unspecified hearing loss, bilateral: Secondary | ICD-10-CM

## 2019-05-13 DIAGNOSIS — M5441 Lumbago with sciatica, right side: Secondary | ICD-10-CM

## 2019-05-13 DIAGNOSIS — M5442 Lumbago with sciatica, left side: Secondary | ICD-10-CM

## 2019-05-13 DIAGNOSIS — M19011 Primary osteoarthritis, right shoulder: Secondary | ICD-10-CM | POA: Diagnosis not present

## 2019-05-13 MED ORDER — PREDNISONE 20 MG PO TABS: ORAL_TABLET | ORAL | 0 refills | Status: DC

## 2019-05-13 NOTE — Progress Notes (Signed)
Markeia Harkless T. Sheldon Sem, MD Primary Care and [place] at [facility] [place], [address] Phone: [phone]  FAX: [phone]  JEBRON Vega - 74 y.o. male  MRN [id number]  Date of Birth: Dec 31, 1945  Visit Date: 05/13/2019  PCP: Venia Carbon, MD  Referred by: Venia Carbon, MD  Chief Complaint  Patient presents with  . Leg Pain    Right  . Shoulder Pain    Right  . Hearing Loss    Asking to have ears flushed out    This visit occurred during the SARS-CoV-2 public health emergency.  Safety protocols were in place, including screening questions prior to the visit, additional usage of staff PPE, and extensive cleaning of exam room while observing appropriate contact time as indicated for disinfecting solutions.   Subjective:   Frank Vega is a 74 y.o. very pleasant male patient with Body mass index is 33.49 kg/m. who presents with the following:  The patient presents with some ongoing leg pain. R shoulder pain, R leg pain.  Hearing loss ?  R knee a couple of weeks ago and a couple of weeks ago.  Was sitting on a high chair.  After a while, buttocks andd down the who length of his leg. A little bit better today.  It still hurts and with standing in church.  Trouble some while standing at church.  October 19, 2018 lumbar spine films, these were reviewed by myself independently.  He does have multilevel lumbosacral degenerative disc changes without other acute pathology. Electronically Signed  By: Owens Loffler, MD On: 05/13/2019 11:00 AM EST   Lab Results  Component Value Date   HGBA1C 7.3 (H) 10/19/2018     In driving with standing and with driving may not as ? Prior neuropathy.  R shoulder. Off and oin from a year. ? Frozen shoulder. ? Loss of motion.  He has pain with motion particularly in terminal abduction and flexion.  He has not had any known injury.  He does have a history of diabetes mellitus.  He  has never had restriction of motion like this in the past.  He think that this has been going on for about 1 to 2 years.  His had a insidious onset.  l great toe  Review of Systems is noted in the HPI, as appropriate   Objective:   BP 138/60   Pulse 94   Temp 98.3 F (36.8 C) (Temporal)   Ht 5\' 9"  (1.753 m)   Wt 226 lb 12 oz (102.9 kg)   SpO2 94%   BMI 33.49 kg/m    GEN: No acute distress; alert,appropriate. PULM: Breathing comfortably in no respiratory distress PSYCH: Normally interactive.   Both ears are clear.  Range of motion at  the waist: Flexion: normal Extension: normal Lateral bending: normal Rotation: all normal  No echymosis or edema Rises to examination table with no difficulty Gait: non antalgic  Inspection/Deformity: N Paraspinus Tenderness: He does have some tenderness from L4-S1 bilaterally  B Ankle Dorsiflexion (L5,4): 5/5 B Great Toe Dorsiflexion (L5,4): 4/5 on the left Heel Walk (L5): WNL Toe Walk (S1): WNL Rise/Squat (L4): WNL  SENSORY B Medial Foot (L4): WNL B Dorsum (L5): WNL B Lateral (S1): WNL Light Touch: WNL Pinprick: WNL  REFLEXES Knee (L4): 2+ Ankle (S1): 2+  B SLR, seated: neg B SLR, supine: neg B Greater Troch: NT B Log Roll: neg  At the shoulder  the patient has abduction that is limited to 105 degrees.  Flexion is to 115.  He has significant pain at terminal motion.  Does have pain at the [facility] joint as well as in the bicipital groove.  There is grinding with active motion.  He is unable to complete any provocative maneuvers for his rotator cuff.  Strength is 5/5 in all directions.  Otherwise he is also neurovascularly intact.  Radiology: No results found.  Assessment and Plan:     ICD-10-CM   1. Acute bilateral low back pain with bilateral sciatica  M54.42    M54.41   2. Right shoulder pain, unspecified chronicity  M25.511 DG Shoulder Right  3. 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 Arthritis of shoulder  region, right  M19.011    Lumbar radiculopathy, known multilevel degenerative disc disease.  I am going to pulse the patient with 14 days of steroids.  Hopefully this will calm down his symptoms.  This is not new.  Right shoulder plain films show severe glenohumeral osteoarthritis.  There is also some significant osteophytosis present.  This is the reason for the patient's significant lack of motion.  We reviewed basic range of motion which may increase his range of motion to some degree.  This also very likely will be held from his oral steroids.  If symptoms persist I could also do a therapeutic corticosteroid injection.  He understands that his joint space will not recover compared to many years prior.  Follow-up: No follow-ups on file.  Meds ordered this encounter  Medications  . predniSONE (DELTASONE) 20 MG tablet    Sig: 2 tabs po for 7 days, then 1 tab po for 7 days    Dispense:  21 tablet    Refill:  0   Medications Discontinued During This Encounter  Medication Reason  . OVER THE COUNTER MEDICATION Patient Preference   Orders Placed This Encounter  Procedures  . DG Shoulder Right    Signed,  Frederico Hamman T. Brendyn Mclaren, MD   Outpatient Encounter Medications as of 05/13/2019  Medication Sig  . amLODipine (NORVASC) 5 MG tablet TAKE 1 TABLET BY MOUTH  DAILY  . amoxicillin (AMOXIL) 500 MG capsule Take all 4 tablets by mouth 1 hour before dental procedure  . aspirin 81 MG chewable tablet Chew 81 mg by mouth daily.  Marland Kitchen atorvastatin (LIPITOR) 10 MG tablet Take 1 tablet (10 mg total) by mouth once a week.  . diphenoxylate-atropine (LOMOTIL) 2.5-0.025 MG tablet TAKE 1 TABLET BY MOUTH DAILY AS NEEDED FOR DIARRHEA OR LOOSE STOOLS.  Marland Kitchen glipiZIDE (GLUCOTROL XL) 5 MG 24 hr tablet TAKE 1 TABLET BY MOUTH EVERY DAY WITH BREAKFAST  . lisinopril-hydrochlorothiazide (ZESTORETIC) 20-12.5 MG tablet TAKE 2 TABLETS BY MOUTH  DAILY  . metFORMIN (GLUCOPHAGE-XR) 750 MG 24 hr tablet TAKE 1 TABLET (750 MG  TOTAL) BY MOUTH DAILY WITH SUPPER.  . mometasone (ELOCON) 0.1 % ointment Apply topically 2 (two) times daily.  . Multiple Vitamins-Minerals (MULTIVITAMIN MEN PO) Take by mouth.  . triamcinolone cream (KENALOG) 0.1 % Apply 1 application topically 2 (two) times daily as needed.  . [DISCONTINUED] OVER THE COUNTER MEDICATION Ultimate Man-T  . predniSONE (DELTASONE) 20 MG tablet 2 tabs po for 7 days, then 1 tab po for 7 days   No facility-administered encounter medications on file as of 05/13/2019.

## 2019-05-13 NOTE — Patient Instructions (Addendum)
Work on your motion on your shoulder.     Lab Review:  CBC EXTENDED Latest Ref Rng & Units 10/19/2018 04/18/2017  WBC 4.0 - 10.5 K/uL 10.0 9.1  RBC 4.22 - 5.81 Mil/uL 4.34 4.37  HGB 13.0 - 17.0 g/dL 13.8 13.9  HCT 39.0 - 52.0 % 41.4 41.4  PLT 150.0 - 400.0 K/uL 269.0 288.0    BMP Latest Ref Rng & Units 10/19/2018 04/18/2017  Glucose 70 - 99 mg/dL 142(H) 96  BUN 6 - 23 mg/dL 17 12  Creatinine 0.40 - 1.50 mg/dL 0.87 0.71  Sodium 135 - 145 mEq/L 138 139  Potassium 3.5 - 5.1 mEq/L 4.4 4.0  Chloride 96 - 112 mEq/L 101 102  CO2 19 - 32 mEq/L 30 29  Calcium 8.4 - 10.5 mg/dL 9.6 9.7    Hepatic Function Latest Ref Rng & Units 10/19/2018 04/18/2017  Total Protein 6.0 - 8.3 g/dL 7.1 7.1  Albumin 3.5 - 5.2 g/dL 4.3 4.2  AST 0 - 37 U/L 21 18  ALT 0 - 53 U/L 27 24  Alk Phosphatase 39 - 117 U/L 66 60  Total Bilirubin 0.2 - 1.2 mg/dL 0.8 0.8    Lab Results  Component Value Date   CHOL 198 10/19/2018   Lab Results  Component Value Date   HDL 37.20 (L) 10/19/2018   No results found for: [facility] Lab Results  Component Value Date   TRIG 396.0 (H) 10/19/2018   Lab Results  Component Value Date   CHOLHDL 5 10/19/2018   No results for input(s): PSA in the last 72 hours. No results found for: HCVAB No results found for: VD25OH   Lab Results  Component Value Date   HGBA1C 7.3 (H) 10/19/2018   HGBA1C 7.0 (A) 05/16/2018   HGBA1C 6.5 (A) 11/08/2017   Lab Results  Component Value Date   CREATININE 0.87 10/19/2018

## 2019-05-20 ENCOUNTER — Telehealth: Payer: Self-pay | Admitting: Internal Medicine

## 2019-05-20 MED ORDER — METFORMIN HCL ER 750 MG PO TB24: 750.0000 mg | ORAL_TABLET | Freq: Every day | ORAL | 3 refills | Status: DC

## 2019-05-20 MED ORDER — LISINOPRIL-HYDROCHLOROTHIAZIDE 20-12.5 MG PO TABS: 2.0000 | ORAL_TABLET | Freq: Every day | ORAL | 3 refills | Status: DC

## 2019-05-20 MED ORDER — GLIPIZIDE ER 5 MG PO TB24: ORAL_TABLET | ORAL | 3 refills | Status: DC

## 2019-05-20 MED ORDER — AMLODIPINE BESYLATE 5 MG PO TABS: 5.0000 mg | ORAL_TABLET | Freq: Every day | ORAL | 3 refills | Status: DC

## 2019-05-20 NOTE — Telephone Encounter (Signed)
Pt called to request a 90 day supply of each of the following medications to be sent to [facility] in [place] at [address]  Amlodipine Glipizide Lisinopril Metformin

## 2019-05-20 NOTE — Telephone Encounter (Signed)
Rxs sent electronically.  

## 2019-05-20 NOTE — Addendum Note (Signed)
Addended by: Pilar Grammes on: 05/20/2019 05:11 PM   Modules accepted: Orders

## 2019-05-23 ENCOUNTER — Telehealth: Payer: Self-pay | Admitting: Internal Medicine

## 2019-05-23 NOTE — Progress Notes (Signed)
  Chronic Care Management   Note  05/23/2019 Name: Frank Vega MRN: [id number] DOB: [birth year]  Frank Vega is a 74 y.o. year old male who is a primary care patient of [place], Theophilus Kinds, MD. I reached out to [name] by phone today in response to a referral sent by [name]'s PCP, Venia Carbon, MD. Patient wife [name] gave verbal consent for CCM.  [name] was given information about Chronic Care Management services today including:  1. CCM service includes personalized support from designated clinical staff supervised by his physician, including individualized plan of care and coordination with other care providers 2. 24/7 contact phone numbers for assistance for urgent and routine care needs. 3. Service will only be billed when office clinical staff spend 20 minutes or more in a month to coordinate care. 4. Only one practitioner may furnish and bill the service in a calendar month. 5. The patient may stop CCM services at any time (effective at the end of the month) by phone call to the office staff.   Patient agreed to services and verbal consent obtained.   Follow up plan:   [name] [employer] Scheduler

## 2019-05-23 NOTE — Telephone Encounter (Signed)
Frank Vega (DPR signed) said [facility] [address] does not have pts refills for amlodipine 5 mg, glipizide 5 mg,lisinopril HCTZ 20-12.5 mg and metformin 750 mg. Frank Vega will [facility] [address] and have refills transferred from [place].

## 2019-06-08 ENCOUNTER — Telehealth: Payer: Self-pay

## 2019-06-08 DIAGNOSIS — E1142 Type 2 diabetes mellitus with diabetic polyneuropathy: Secondary | ICD-10-CM

## 2019-06-08 DIAGNOSIS — I10 Essential (primary) hypertension: Secondary | ICD-10-CM

## 2019-06-08 NOTE — Telephone Encounter (Signed)
I would like to request a referral for Frank Vega to chronic care management pharmacy services for the following conditions:   Essential hypertension, benign  [I10]  Diabetes mellitus with polyneuropathy [E11.42]  Debbora Dus, PharmD Clinical Pharmacist [facility] at [facility] [phone]

## 2019-06-10 NOTE — Telephone Encounter (Signed)
Referral created.

## 2019-06-13 ENCOUNTER — Ambulatory Visit: Payer: Medicare Other

## 2019-06-13 ENCOUNTER — Other Ambulatory Visit: Payer: Self-pay

## 2019-06-13 DIAGNOSIS — E1142 Type 2 diabetes mellitus with diabetic polyneuropathy: Secondary | ICD-10-CM

## 2019-06-13 DIAGNOSIS — K580 Irritable bowel syndrome with diarrhea: Secondary | ICD-10-CM

## 2019-06-13 DIAGNOSIS — I872 Venous insufficiency (chronic) (peripheral): Secondary | ICD-10-CM

## 2019-06-13 DIAGNOSIS — I10 Essential (primary) hypertension: Secondary | ICD-10-CM

## 2019-06-13 DIAGNOSIS — M159 Polyosteoarthritis, unspecified: Secondary | ICD-10-CM

## 2019-06-13 DIAGNOSIS — Z789 Other specified health status: Secondary | ICD-10-CM

## 2019-06-13 DIAGNOSIS — F39 Unspecified mood [affective] disorder: Secondary | ICD-10-CM

## 2019-06-13 NOTE — Chronic Care Management (AMB) (Signed)
Chronic Care Management Pharmacy  Name: Frank Vega  MRN: [id number] DOB: 04/12/1945  Chief Complaint/ HPI  Frank Vega,  74 y.o., male presents for their Initial CCM visit with the clinical pharmacist via telephone.  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 Paxil [Paroxetine Hcl]     Weight loss and jittery  . Shellfish Allergy Swelling  . Latex Rash   Medications: Outpatient Encounter Medications as of 06/13/2019  Medication Sig  . amLODipine (NORVASC) 5 MG tablet Take 1 tablet (5 mg total) by mouth daily.  Marland Kitchen amoxicillin (AMOXIL) 500 MG capsule Take all 4 tablets by mouth 1 hour before dental procedure  . aspirin 81 MG chewable tablet Chew 81 mg by mouth daily.  Marland Kitchen atorvastatin (LIPITOR) 10 MG tablet Take 1 tablet (10 mg total) by mouth once a week.  . diphenoxylate-atropine (LOMOTIL) 2.5-0.025 MG tablet TAKE 1 TABLET BY MOUTH DAILY AS NEEDED FOR DIARRHEA OR LOOSE STOOLS.  Marland Kitchen glipiZIDE (GLUCOTROL XL) 5 MG 24 hr tablet TAKE 1 TABLET BY MOUTH EVERY DAY WITH BREAKFAST  . lisinopril-hydrochlorothiazide (ZESTORETIC) 20-12.5 MG tablet Take 2 tablets by mouth daily.  . metFORMIN (GLUCOPHAGE-XR) 750 MG 24 hr  tablet Take 1 tablet (750 mg total) by mouth daily with supper.  . mometasone (ELOCON) 0.1 % ointment Apply topically 2 (two) times daily.  . Multiple Vitamins-Minerals (MULTIVITAMIN MEN PO) Take by mouth.  . predniSONE (DELTASONE) 20 MG tablet 2 tabs po for 7 days, then 1 tab po for 7 days  . triamcinolone cream (KENALOG) 0.1 % Apply 1 application topically 2 (two) times daily as needed.   No facility-administered encounter medications on file as of 06/13/2019.   Current Diagnosis/Assessment: Goals    . Pharmacy Care Plan     CARE PLAN ENTRY  Current Barriers:  . Chronic Disease Management support, education, and care coordination needs related to hypertension, hyperlipidemia, type 2 diabetes  Pharmacist Clinical Goal(s):  Marland Kitchen Improve leg pain and consult with Dr. Silvio Vega regarding patient interest in duloxetine.  . Maintain blood pressure within goal of less than 140/90 mmHg. Continue current medications. Work towards increasing exercise with goal of brisk walking or similar level activity 30 minutes, 5 days a week. . Improve control of diabetes with goal A1c less than 7%, goal fasting sugar between 80-130 mg/dL and goal 2 hours after meals less than 180 mg/dL. Continue current medications. Recommend keeping a daily log of fasting blood glucose readings and checking again 1-2 hours after largest meal of the day for the next 4 weeks.  . Improve cholesterol and reduce cardiovascular risk. Begin taking atorvastatin 10 mg once weekly. Please call if you  have any concerns about side effects. Will plan to evaluate cholesterol with labs in 4-12 weeks. . Remain up to date on vaccinations. Recommend seasonal influenza vaccine, tetanus (Tdap), and shingles vaccine 2-dose series (Shingrix). The Tdap and Shingrix are only covered if given at your pharmacy.   Interventions: . Comprehensive medication review performed . Reviewed risk and benefits of atorvastatin and recommended starting therapy  Patient  Self Care Activities:  . Self administers medications as prescribed . Self-monitors blood glucose  Initial goal documentation      Hypertension   CMP Latest Ref Rng & Units 10/19/2018 04/18/2017  Glucose 70 - 99 mg/dL 142(H) 96  BUN 6 - 23 mg/dL 17 12  Creatinine 0.40 - 1.50 mg/dL 0.87 0.71  Sodium 135 - 145 mEq/L 138 139  Potassium 3.5 - 5.1 mEq/L 4.4 4.0  Chloride 96 - 112 mEq/L 101 102  CO2 19 - 32 mEq/L 30 29  Calcium 8.4 - 10.5 mg/dL 9.6 9.7  Total Protein 6.0 - 8.3 g/dL 7.1 7.1  Total Bilirubin 0.2 - 1.2 mg/dL 0.8 0.8  Alkaline Phos 39 - 117 U/L 66 60  AST 0 - 37 U/L 21 18  ALT 0 - 53 U/L 27 24   Office blood pressures are: BP Readings from Last 3 Encounters:  05/13/19 138/60  10/19/18 124/76  05/16/18 132/72   BP goal < 140/90 mmHg Patient has failed these meds in the past: none reported Patient checks BP at home: infrequently  Patient home BP readings are ranging: none reported  Patient is currently controlled on the following medications:   Amlodipine 5 mg - 1 tablet daily (AM)  Lisinopril-HCTZ 20-12.5 mg - 2 tablets daily (AM)  We discussed:   Diet: chicken or salmon with green beans and tomatoes; avoids pork/bbq, fried meats; eats a lot of sweets but trying to eat fruit or yogurt instead; drinks: Twist (sugar free), water   Exercise: difficult with leg pain, spends 1-2 hours doing yard work weekly, lots of house chores, stretching frequently  Plan: Continue current medications  Diabetes   Recent Relevant Labs: Lab Results  Component Value Date/Time   HGBA1C 7.3 (H) 10/19/2018 10:47 AM   HGBA1C 7.0 (A) 05/16/2018 08:52 AM   HGBA1C 6.5 (A) 11/08/2017 10:41 AM   HGBA1C 6.4 04/18/2017 12:39 PM    A1c goal < 7% Checking BG: checks  Recent FBG Readings: 149 today (ate late meal last night), usually 113-127  Patient has failed these meds in past: none reported Patient is currently uncontrolled on the following medications:   Glipizide 5 mg - 1  tablet daily with breakfast (AM)   Metformin 750 mg ER - 1 tablet daily with supper (PM)  Last diabetic eye exam:  Lab Results  Component Value Date/Time   HMDIABEYEEXA No Retinopathy 12/31/2018 12:00 AM    Last diabetic foot exam: reports checked at last AWV  Lab Results  Component Value Date/Time   HMDIABFOOTEX done 11/08/2017 12:00 AM    We discussed: will continue to monitor and work on diet/activity level   Plan: Continue current medications; Recommend checking post-prandial readings to further assess blood glucose.   Hyperlipidemia   Lipid Panel     Component Value Date/Time   CHOL 198 10/19/2018 1047   TRIG 396.0 (H) 10/19/2018 1047   HDL 37.20 (L) 10/19/2018 1047   CHOLHDL 5 10/19/2018 1047   VLDL 79.2 (H) 10/19/2018 1047   LDLDIRECT 94.0 10/19/2018 1047    CBC Latest Ref Rng & Units  10/19/2018 04/18/2017  WBC 4.0 - 10.5 K/uL 10.0 9.1  Hemoglobin 13.0 - 17.0 g/dL 13.8 13.9  Hematocrit 39.0 - 52.0 % 41.4 41.4  Platelets 150.0 - 400.0 K/uL 269.0 288.0   The 10-year ASCVD risk score Mikey Bussing DC Jr., et al., 2013) is: 51.3%   Values used to calculate the score:     Age: 57 years     Sex: Male     Is Non-Hispanic African American: No     Diabetic: Yes     Tobacco smoker: No     Systolic Blood Pressure: 0000000 mmHg     Is BP treated: Yes     HDL Cholesterol: 37.2 mg/dL     Total Cholesterol: 198 mg/dL   LDL goal < 100 Patient has failed these meds in past: none Patient is currently controlled on the following medications:   Atorvastatin 10 mg - 1 tablet weekly (not taking)  Aspirin 81 mg - 1 tablet daily (on hold due to dental procedure)   We discussed: family history - reports father had multiple heart attacks prior to age 73, denies taking atorvastatin due to reading fact sheet, has not started it; we discussed risks and pt is willing to try atorvastatin 10 mg - 1 weekly   Plan: Patient to start atorvastatin 10 mg once weekly as previously prescribed. Recommend  checking lipid panel in 4-12 weeks.   Vaccines   Reviewed and discussed patient's vaccination history.    Immunization History  Administered Date(s) Administered  . Influenza,inj,Quad PF,6+ Mos 11/08/2017  . Influenza-Unspecified 12/30/2014, 02/09/2017  . Pneumococcal Conjugate-13 11/08/2017  . Pneumococcal Polysaccharide-23 05/13/2014   Plan: Recommended patient receive seasonal influenza vaccine, Tdap, Shingrix. Reports received both doses of COVID-19 vaccine.   Medication Management  Misc: triamcinolone cream 0.1% - BID, prednisone 20 mg -completed course, mometasone 0.1% ointment, amoxicillin 500 mg - TID, still taking for dental work on 4/14, plans to have all upper teeth pulled   OTCs: multivitamin, Lomotil - 1 tablet daily PRN diarrhea  Pharmacy/Benefits: [employer]/[facility], Mail order  Adherence: pillbox  Affordability: no concerns   CCM Follow Up: 07/15/19 at 11:00 AM (telephone)  Debbora Dus, PharmD Clinical Pharmacist [facility] at [facility] [phone]

## 2019-06-14 ENCOUNTER — Telehealth: Payer: Self-pay

## 2019-06-14 NOTE — Telephone Encounter (Signed)
Patient request for pain medication regarding CCM visit 06/13/19:   Patient reports interest in starting duloxetine for pain as his wife has done well on it. He reports leg pain with a scale of 4 out of 10 most days, worsening with activity. Describes pain as muscle tightness and dull, achy. Also experiencing back and shoulder pain. He has tried stretching and Tylenol arthritis with little relief. We discussed that you may like to see him to evaluate. I am happy to follow up with him regarding your recommendations.  Thank you,  Debbora Dus, PharmD Clinical Pharmacist [facility] at [facility] [phone]

## 2019-06-14 NOTE — Telephone Encounter (Signed)
Great, I will let him know.  Morey Hummingbird, Would you mind contacting Mr. Morelock to schedule an office visit in 1 month with Dr. Silvio Pate?  (to discuss his pain/see how he is doing on duloxetine)  Debbora Dus, PharmD Clinical Pharmacist [facility] at [facility] [phone]

## 2019-06-14 NOTE — Telephone Encounter (Signed)
Patient wasn't home.  I spoke to his wife and she'll ask him to call back to schedule 1 month follow up with Dr.Letvak.

## 2019-06-14 NOTE — Telephone Encounter (Signed)
Given his known radicular pain, I am okay with a trial of duloxetine 30mg  daily (okay to send #30 x 1) Then set him up for follow up with me in 1 month or so to review how he is doing

## 2019-06-14 NOTE — Patient Instructions (Addendum)
Dear Frank Vega,  It was a pleasure meeting you during our initial appointment on June 13, 2019. Below is a summary of the goals we discussed and components of chronic care management. Please contact me anytime with questions or concerns.   Visit Information  Goals Addressed            This Visit's Progress   . Pharmacy Care Plan       CARE PLAN ENTRY  Current Barriers:  . Chronic Disease Management support, education, and care coordination needs related to hypertension, hyperlipidemia, type 2 diabetes  Pharmacist Clinical Goal(s):  Marland Kitchen Improve leg pain and consult with Dr. Silvio Pate regarding patient interest in duloxetine.  . Maintain blood pressure within goal of less than 140/90 mmHg. Continue current medications. Work towards increasing exercise with goal of brisk walking or similar level activity 30 minutes, 5 days a week. . Improve control of diabetes with goal A1c less than 7%, goal fasting sugar between 80-130 mg/dL and goal 2 hours after meals less than 180 mg/dL. Continue current medications. Recommend keeping a daily log of fasting blood glucose readings and checking again 1-2 hours after largest meal of the day for the next 4 weeks.  . Improve cholesterol and reduce cardiovascular risk. Begin taking atorvastatin 10 mg once weekly. Please call if you have any concerns about side effects. Will plan to evaluate cholesterol with labs in 4-12 weeks. . Remain up to date on vaccinations. Recommend seasonal influenza vaccine, tetanus (Tdap), and shingles vaccine 2-dose series (Shingrix). The Tdap and Shingrix are only covered if given at your pharmacy.   Interventions: . Comprehensive medication review performed . Reviewed risk and benefits of atorvastatin and recommended starting therapy  Patient Self Care Activities:  . Self administers medications as prescribed . Self-monitors blood glucose  Initial goal documentation      Frank Vega was given information about Chronic Care  Management services today including:  1. CCM service includes personalized support from designated clinical staff supervised by his physician, including individualized plan of care and coordination with other care providers 2. 24/7 contact phone numbers for assistance for urgent and routine care needs. 3. Standard insurance, coinsurance, copays and deductibles apply for chronic care management only during months in which we provide at least 20 minutes of these services. Most insurances cover these services at 100%, however patients may be responsible for any copay, coinsurance and/or deductible if applicable. This service may help you avoid the need for more expensive face-to-face services. 4. Only one practitioner may furnish and bill the service in a calendar month. 5. The patient may stop CCM services at any time (effective at the end of the month) by phone call to the office staff.  Patient agreed to services and verbal consent obtained.   The patient verbalized understanding of instructions provided today and agreed to receive a mailed copy of patient instruction and/or educational materials. Telephone follow up appointment with pharmacy team member scheduled for:  07/15/19 at 11:00 AM (telephone)   Debbora Dus, PharmD Clinical Pharmacist [facility] at [facility] [phone]  Diabetes Mellitus and Nutrition, Adult When you have diabetes (diabetes mellitus), it is very important to have healthy eating habits because your blood sugar (glucose) levels are greatly affected by what you eat and drink. Eating healthy foods in the appropriate amounts, at about the same times every day, can help you:  Control your blood glucose.  Lower your risk of heart disease.  Improve your blood pressure.  Reach or maintain a healthy weight. Every person with diabetes is different, and each person has different needs for a meal plan. Your health care provider may recommend that you work with  a diet and nutrition specialist (dietitian) to make a meal plan that is best for you. Your meal plan may vary depending on factors such as:  The calories you need.  The medicines you take.  Your weight.  Your blood glucose, blood pressure, and cholesterol levels.  Your activity level.  Other health conditions you have, such as heart or kidney disease. How do carbohydrates affect me? Carbohydrates, also called carbs, affect your blood glucose level more than any other type of food. Eating carbs naturally raises the amount of glucose in your blood. Carb counting is a method for keeping track of how many carbs you eat. Counting carbs is important to keep your blood glucose at a healthy level, especially if you use insulin or take certain oral diabetes medicines. It is important to know how many carbs you can safely have in each meal. This is different for every person. Your dietitian can help you calculate how many carbs you should have at each meal and for each snack. Foods that contain carbs include:  Bread, cereal, rice, pasta, and crackers.  Potatoes and corn.  Peas, beans, and lentils.  Milk and yogurt.  Fruit and juice.  Desserts, such as cakes, cookies, ice cream, and candy. How does alcohol affect me? Alcohol can cause a sudden decrease in blood glucose (hypoglycemia), especially if you use insulin or take certain oral diabetes medicines. Hypoglycemia can be a life-threatening condition. Symptoms of hypoglycemia (sleepiness, dizziness, and confusion) are similar to symptoms of having too much alcohol. If your health care provider says that alcohol is safe for you, follow these guidelines:  Limit alcohol intake to no more than 1 drink per day for nonpregnant women and 2 drinks per day for men. One drink equals 12 oz of beer, 5 oz of wine, or 1 oz of hard liquor.  Do not drink on an empty stomach.  Keep yourself hydrated with water, diet soda, or unsweetened iced tea.  Keep  in mind that regular soda, juice, and other mixers may contain a lot of sugar and must be counted as carbs. What are tips for following this plan?  Reading food labels  Start by checking the serving size on the "Nutrition Facts" label of packaged foods and drinks. The amount of calories, carbs, fats, and other nutrients listed on the label is based on one serving of the item. Many items contain more than one serving per package.  Check the total grams (g) of carbs in one serving. You can calculate the number of servings of carbs in one serving by dividing the total carbs by 15. For example, if a food has 30 g of total carbs, it would be equal to 2 servings of carbs.  Check the number of grams (g) of saturated and trans fats in one serving. Choose foods that have low or no amount of these fats.  Check the number of milligrams (mg) of salt (sodium) in one serving. Most people should limit total sodium intake to less than 2,300 mg per day.  Always check the nutrition information of foods labeled as "low-fat" or "nonfat". These foods may be higher in added sugar or refined carbs and should be avoided.  Talk to your dietitian to identify your daily goals for nutrients listed on the label. Shopping  Avoid  buying canned, premade, or processed foods. These foods tend to be high in fat, sodium, and added sugar.  Shop around the outside edge of the grocery store. This includes fresh fruits and vegetables, bulk grains, fresh meats, and fresh dairy. Cooking  Use low-heat cooking methods, such as baking, instead of high-heat cooking methods like deep frying.  Cook using healthy oils, such as olive, canola, or sunflower oil.  Avoid cooking with butter, cream, or high-fat meats. Meal planning  Eat meals and snacks regularly, preferably at the same times every day. Avoid going long periods of time without eating.  Eat foods high in fiber, such as fresh fruits, vegetables, beans, and whole grains.  Talk to your dietitian about how many servings of carbs you can eat at each meal.  Eat 4-6 ounces (oz) of lean protein each day, such as lean meat, chicken, fish, eggs, or tofu. One oz of lean protein is equal to: ? 1 oz of meat, chicken, or fish. ? 1 egg. ?  cup of tofu.  Eat some foods each day that contain healthy fats, such as avocado, nuts, seeds, and fish. Lifestyle  Check your blood glucose regularly.  Exercise regularly as told by your health care provider. This may include: ? 150 minutes of moderate-intensity or vigorous-intensity exercise each week. This could be brisk walking, biking, or water aerobics. ? Stretching and doing strength exercises, such as yoga or weightlifting, at least 2 times a week.  Take medicines as told by your health care provider.  Do not use any products that contain nicotine or tobacco, such as cigarettes and e-cigarettes. If you need help quitting, ask your health care provider.  Work with a Social worker or diabetes educator to identify strategies to manage stress and any emotional and social challenges. Questions to ask a health care provider  Do I need to meet with a diabetes educator?  Do I need to meet with a dietitian?  What number can I call if I have questions?  When are the best times to check my blood glucose? Where to find more information:  American Diabetes Association: diabetes.org  Academy of Nutrition and Dietetics: www.eatright.[employer] of Diabetes and Digestive and Kidney Diseases (NIH): [employer] Summary  A healthy meal plan will help you control your blood glucose and maintain a healthy lifestyle.  Working with a diet and nutrition specialist (dietitian) can help you make a meal plan that is best for you.  Keep in mind that carbohydrates (carbs) and alcohol have immediate effects on your blood glucose levels. It is important to count carbs and to use alcohol carefully. This information is not  intended to replace advice given to you by your health care provider. Make sure you discuss any questions you have with your health care provider. Document Revised: 02/03/2017 Document Reviewed: 03/28/2016 Elsevier Patient Education  2020 [employer].

## 2019-06-16 MED ORDER — DULOXETINE HCL 30 MG PO CPEP: 30.0000 mg | ORAL_CAPSULE | Freq: Every day | ORAL | 1 refills | Status: DC

## 2019-06-16 NOTE — Addendum Note (Signed)
Addended by: Viviana Simpler I on: 06/16/2019 12:52 PM   Modules accepted: Orders

## 2019-06-21 ENCOUNTER — Ambulatory Visit (INDEPENDENT_AMBULATORY_CARE_PROVIDER_SITE_OTHER): Payer: Medicare Other | Admitting: Internal Medicine

## 2019-06-21 ENCOUNTER — Other Ambulatory Visit: Payer: Self-pay

## 2019-06-21 ENCOUNTER — Encounter: Payer: Self-pay | Admitting: Internal Medicine

## 2019-06-21 DIAGNOSIS — M5416 Radiculopathy, lumbar region: Secondary | ICD-10-CM | POA: Diagnosis not present

## 2019-06-21 MED ORDER — PREDNISONE 20 MG PO TABS: 40.0000 mg | ORAL_TABLET | Freq: Every day | ORAL | 0 refills | Status: DC

## 2019-06-21 NOTE — Assessment & Plan Note (Signed)
Doesn't sound like sciatica Might need imaging Will try prednisone burst again If not better, will send to physiatry at [facility]

## 2019-06-21 NOTE — Progress Notes (Signed)
Subjective:    Patient ID: Frank Vega, male    DOB: 1945-11-11, 74 y.o.   MRN: [id number]  HPI Here due to right leg pain again This visit occurred during the SARS-CoV-2 public health emergency.  Safety protocols were in place, including screening questions prior to the visit, additional usage of staff PPE, and extensive cleaning of exam room while observing appropriate contact time as indicated for disinfecting solutions.   Reviewed visit last month with Dr Lorelei Pont Did have complete resolution of pain with the steroid taper Pain started back again 2 days ago Today--even hard to walk Feels like right quad and hamstrings just "squeeze up"  Did have multiple teeth extracted 2 days ago In chair for ~90 minutes Also doing yard work  Leg is not weak Hasn't been able to walk due to the pain No numbness or tingling  Current Outpatient Medications on File Prior to Visit  Medication Sig Dispense Refill  . amLODipine (NORVASC) 5 MG tablet Take 1 tablet (5 mg total) by mouth daily. 90 tablet 3  . amoxicillin (AMOXIL) 500 MG capsule Take all 4 tablets by mouth 1 hour before dental procedure 4 capsule 2  . aspirin 81 MG chewable tablet Chew 81 mg by mouth daily.    Marland Kitchen atorvastatin (LIPITOR) 10 MG tablet Take 1 tablet (10 mg total) by mouth once a week. 13 tablet 3  . chlorhexidine (PERIDEX) 0.12 % solution RINSE 15 ML BY MOUTH FOR 30 SECONDS AND EXPECTORATE TWICE DAILY FOR 2 WEEKS    . diphenoxylate-atropine (LOMOTIL) 2.5-0.025 MG tablet TAKE 1 TABLET BY MOUTH DAILY AS NEEDED FOR DIARRHEA OR LOOSE STOOLS. 30 tablet 0  . DULoxetine (CYMBALTA) 30 MG capsule Take 1 capsule (30 mg total) by mouth daily. 30 capsule 1  . glipiZIDE (GLUCOTROL XL) 5 MG 24 hr tablet TAKE 1 TABLET BY MOUTH EVERY DAY WITH BREAKFAST 90 tablet 3  . ibuprofen (ADVIL) 800 MG tablet Take 800 mg by mouth every 8 (eight) hours as needed. For pain after dental procedure    . lisinopril-hydrochlorothiazide (ZESTORETIC) 20-12.5  MG tablet Take 2 tablets by mouth daily. 180 tablet 3  . metFORMIN (GLUCOPHAGE-XR) 750 MG 24 hr tablet Take 1 tablet (750 mg total) by mouth daily with supper. 90 tablet 3  . mometasone (ELOCON) 0.1 % ointment Apply topically 2 (two) times daily.    . Multiple Vitamins-Minerals (MULTIVITAMIN MEN PO) Take by mouth.    . triamcinolone cream (KENALOG) 0.1 % Apply 1 application topically 2 (two) times daily as needed. 45 g 1   No current facility-administered medications on file prior to visit.    Allergies  Allergen Reactions  . Paxil [Paroxetine Hcl]     Weight loss and jittery  . Shellfish Allergy Swelling  . Latex Rash    Past Medical History:  Diagnosis Date  . 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 Diabetes mellitus with polyneuropathy ([place])   . GERD (gastroesophageal reflux disease)   . Hypertension   . IBS (irritable bowel syndrome)   . Osteoarthritis of multiple joints     Past Surgical History:  Procedure Laterality Date  . COLONOSCOPY WITH PROPOFOL N/A 07/18/2017   Procedure: COLONOSCOPY WITH PROPOFOL;  Surgeon: Lollie Sails, MD;  Location: [facility] ENDOSCOPY;  Service: Endoscopy;  Laterality: N/A;  . CYST EXCISION  1952   left leg  . KNEE ARTHROSCOPY Left 2011  . REPLACEMENT TOTAL KNEE Left   . TOTAL HIP ARTHROPLASTY Right  Family History  Problem Relation Age of Onset  . Arthritis Mother   . Hypertension Mother   . Dementia Mother   . Heart attack Father   . Heart disease Father   . Cancer Father        lymphoma    Social History   Socioeconomic History  . Marital status: Married    Spouse name: Not on file  . Number of children: 1  . Years of education: Not on file  . Highest education level: Not on file  Occupational History  . Occupation: Electrical engineer    Comment: Retired  Tobacco Use  . Smoking status: Never Smoker  . Smokeless tobacco: Never Used  Substance and Sexual Activity  . Alcohol use: No  . Drug  use: No  . Sexual activity: Never  Other Topics Concern  . Not on file  Social History Narrative   1 daughter--local      No living will   Wife, then daughter, should make health care decisions   Would accept resuscitation   Not sure about tube feeds   Social Determinants of Health   Financial Resource Strain: Low Risk   . Difficulty of Paying Living Expenses: Not very hard  Food Insecurity:   . Worried About Charity fundraiser in the Last Year:   . Arboriculturist in the Last Year:   Transportation Needs:   . Film/video editor (Medical):   Marland Kitchen Lack of Transportation (Non-Medical):   Physical Activity:   . Days of Exercise per Week:   . Minutes of Exercise per Session:   Stress:   . Feeling of Stress :   Social Connections:   . Frequency of Communication with Friends and Family:   . Frequency of Social Gatherings with Friends and Family:   . Attends Religious Services:   . Active Member of Clubs or Organizations:   . Attends Archivist Meetings:   Marland Kitchen Marital Status:   Intimate Partner Violence:   . Fear of Current or Ex-Partner:   . Emotionally Abused:   Marland Kitchen Physically Abused:   . Sexually Abused:    Review of Systems  No fever Not sick     Objective:   Physical Exam  Constitutional: He appears well-developed. No distress.  Musculoskeletal:     Comments: No spine tenderness SLR negative ROM right hip is fairly normal  Neurological:  Slightly antalgic gait---right leg "catches" No leg weakness           Assessment & Plan:

## 2019-06-21 NOTE — Patient Instructions (Signed)
Please let me know if you are not better with the prednisone--I will go ahead with the referral to the physiatrist

## 2019-07-01 ENCOUNTER — Telehealth: Payer: Self-pay | Admitting: Internal Medicine

## 2019-07-01 DIAGNOSIS — M5416 Radiculopathy, lumbar region: Secondary | ICD-10-CM

## 2019-07-01 NOTE — Telephone Encounter (Signed)
It may be worth going to a physiatrist who can further evaluate him and do an injection of steroid if that is indicated

## 2019-07-01 NOTE — Telephone Encounter (Signed)
Patient called to let you know how he was doing Patient completed the prednisone, and took medication as instructed. He stated that while taking the medication the leg pain was improving. now that he has finished taking the prednisone the leg pain has returned.   Patient would like to know what you think the next step should be to help.

## 2019-07-01 NOTE — Telephone Encounter (Signed)
Spoke to pt. He is interested in the referral. Would like [place].

## 2019-07-01 NOTE — Telephone Encounter (Signed)
Please let him know that I sent the referral in and he should hear about it in the next week or 2.

## 2019-07-02 NOTE — Telephone Encounter (Signed)
Spoke to pt. Said he had a lot of pain last night. He will try Tylenol Arthritis.

## 2019-07-05 ENCOUNTER — Other Ambulatory Visit: Payer: Self-pay | Admitting: Physical Medicine & Rehabilitation

## 2019-07-05 DIAGNOSIS — M5416 Radiculopathy, lumbar region: Secondary | ICD-10-CM

## 2019-07-09 ENCOUNTER — Other Ambulatory Visit: Payer: Self-pay

## 2019-07-09 DIAGNOSIS — D2262 Melanocytic nevi of left upper limb, including shoulder: Secondary | ICD-10-CM | POA: Diagnosis not present

## 2019-07-09 DIAGNOSIS — D2271 Melanocytic nevi of right lower limb, including hip: Secondary | ICD-10-CM | POA: Diagnosis not present

## 2019-07-09 DIAGNOSIS — D2272 Melanocytic nevi of left lower limb, including hip: Secondary | ICD-10-CM | POA: Diagnosis not present

## 2019-07-09 DIAGNOSIS — D2261 Melanocytic nevi of right upper limb, including shoulder: Secondary | ICD-10-CM | POA: Diagnosis not present

## 2019-07-09 DIAGNOSIS — L570 Actinic keratosis: Secondary | ICD-10-CM | POA: Diagnosis not present

## 2019-07-09 DIAGNOSIS — B360 Pityriasis versicolor: Secondary | ICD-10-CM | POA: Diagnosis not present

## 2019-07-09 DIAGNOSIS — D2239 Melanocytic nevi of other parts of face: Secondary | ICD-10-CM | POA: Diagnosis not present

## 2019-07-09 DIAGNOSIS — X32XXXA Exposure to sunlight, initial encounter: Secondary | ICD-10-CM | POA: Diagnosis not present

## 2019-07-09 DIAGNOSIS — D225 Melanocytic nevi of trunk: Secondary | ICD-10-CM | POA: Diagnosis not present

## 2019-07-09 DIAGNOSIS — Z85828 Personal history of other malignant neoplasm of skin: Secondary | ICD-10-CM | POA: Diagnosis not present

## 2019-07-09 MED ORDER — DIPHENOXYLATE-ATROPINE 2.5-0.025 MG PO TABS: 1.0000 | ORAL_TABLET | Freq: Every day | ORAL | 0 refills | Status: DC | PRN

## 2019-07-09 NOTE — Telephone Encounter (Signed)
Patient contacted the office needing a refill on Lomotil. This was last refilled on 08/15/18 for #30 with 0 refills. Patient was last seen 06/21/19 and has an upcoming appt on 07/16/19.  Ok to refill?

## 2019-07-12 MED ORDER — DIPHENOXYLATE-ATROPINE 2.5-0.025 MG PO TABS: 1.0000 | ORAL_TABLET | Freq: Every day | ORAL | 0 refills | Status: DC | PRN

## 2019-07-12 NOTE — Addendum Note (Signed)
Addended by: Viviana Simpler I on: 07/12/2019 01:29 PM   Modules accepted: Orders

## 2019-07-12 NOTE — Addendum Note (Signed)
Addended by: Pilar Grammes on: 07/12/2019 10:13 AM   Modules accepted: Orders

## 2019-07-12 NOTE — Telephone Encounter (Signed)
Patient called back  He stated that the medication was sent over to the wrong pharmacy  He is no longer using [facility]   Patient would like it sent to [place], [place]

## 2019-07-12 NOTE — Telephone Encounter (Signed)
Dr Silvio Pate will have to send it in as it is controlled. I removed [facility] from his list.

## 2019-07-15 ENCOUNTER — Other Ambulatory Visit: Payer: Self-pay

## 2019-07-15 ENCOUNTER — Ambulatory Visit: Payer: Medicare Other

## 2019-07-15 DIAGNOSIS — E1142 Type 2 diabetes mellitus with diabetic polyneuropathy: Secondary | ICD-10-CM

## 2019-07-15 DIAGNOSIS — I10 Essential (primary) hypertension: Secondary | ICD-10-CM

## 2019-07-15 NOTE — Chronic Care Management (AMB) (Signed)
Chronic Care Management Pharmacy  Name: Frank Vega  MRN: [id number] DOB: Apr 06, 1945  Chief Complaint/ HPI  Frank Vega,  74 y.o., male presents for their Follow-Up CCM visit with the clinical pharmacist via telephone.  PCP : Venia Carbon, MD  Their chronic conditions include: hypertension, hyperlipidemia, type 2 diabetes  Patient concerns: continues to have pain in both legs (has been referred to specialty); completed prednisone course without much relief  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), left hip replaced (2011)  Office Visits:  04/16/19: phone call - sent amoxicillin for dental work   10/19/18: Silvio Pate - continue current meds, work on leg strength and core   Consult Visit:  05/13/19: [facility] - 14 days of steroids (reports very helpful with pain)  12/31/18: Diabetes - no note avaialble  Allergies  Allergen Reactions  . Paxil [Paroxetine Hcl]     Weight loss and jittery  . Shellfish Allergy Swelling  . Latex Rash   Medications: Outpatient Encounter Medications as of 07/15/2019  Medication Sig  . amLODipine (NORVASC) 5 MG tablet Take 1 tablet (5 mg total) by mouth daily.  Marland Kitchen aspirin 81 MG chewable tablet Chew 81 mg by mouth daily.  Marland Kitchen atorvastatin (LIPITOR) 10 MG tablet Take 1 tablet (10 mg total) by mouth once a week.  . diphenoxylate-atropine (LOMOTIL) 2.5-0.025 MG tablet Take 1 tablet by mouth daily as needed for diarrhea or loose stools.  Marland Kitchen glipiZIDE (GLUCOTROL XL) 5 MG 24 hr tablet TAKE 1 TABLET BY MOUTH EVERY DAY WITH BREAKFAST  . lisinopril-hydrochlorothiazide (ZESTORETIC) 20-12.5 MG tablet Take 2 tablets by mouth daily.  . metFORMIN (GLUCOPHAGE-XR) 750 MG 24 hr tablet Take 1 tablet (750 mg total) by mouth daily with supper.  . Multiple Vitamins-Minerals (MULTIVITAMIN MEN PO) Take by mouth.  Marland Kitchen amoxicillin (AMOXIL) 500 MG capsule Take all 4 tablets by mouth 1 hour before dental procedure  . chlorhexidine (PERIDEX) 0.12 % solution RINSE 15  ML BY MOUTH FOR 30 SECONDS AND EXPECTORATE TWICE DAILY FOR 2 WEEKS  . DULoxetine (CYMBALTA) 30 MG capsule Take 1 capsule (30 mg total) by mouth daily. (Patient not taking: Reported on 07/15/2019)  . ibuprofen (ADVIL) 800 MG tablet Take 800 mg by mouth every 8 (eight) hours as needed. For pain after dental procedure  . mometasone (ELOCON) 0.1 % ointment Apply topically 2 (two) times daily.  . predniSONE (DELTASONE) 20 MG tablet Take 2 tablets (40 mg total) by mouth daily. For 5 days, then 1 tab daily for 5 days (Patient not taking: Reported on 07/15/2019)  . triamcinolone cream (KENALOG) 0.1 % Apply 1 application topically 2 (two) times daily as needed.   No facility-administered encounter medications on file as of 07/15/2019.   Current Diagnosis/Assessment: Goals    . Pharmacy Care Plan     CARE PLAN ENTRY  Current Barriers:  . Chronic Disease Management support, education, and care coordination needs related to Hypertension, Hyperlipidemia, and Diabetes   Hypertension . Pharmacist Clinical Goal(s): o Over the next 3 months, patient will work with PharmD and providers to maintain BP goal <140/90 . Current regimen:   Amlodipine 5 mg - 1 tablet daily (AM)  Lisinopril-HCTZ 20-12.5 mg - 2 tablets daily (AM) . Interventions: o Continue current medications . Patient self care activities - Over the next 3 months, patient will: o Work towards increasing exercise with goal of brisk walking or similar level activity 30 minutes, 5 days a week.  Hyperlipidemia . Pharmacist Clinical Goal(s): o Over  the next 3 months, patient will work with PharmD and providers to maintain LDL goal < 100 . Current regimen:  o Atorvastatin 10 mg - 1 tablet weekly on Sundays . Interventions: o Continue current medications o Recommend lipid panel in the next 2-3 weeks  . Patient self care activities - Over the next 3 months, patient will: o Continue atorvastatin weekly as prescribed  Diabetes . Pharmacist  Clinical Goal(s): o Over the next 3 months, patient will work with PharmD and providers to achieve A1c goal <7%, fasting blood glucose 80-130 mg/dL, post-meals < 180 mg/dL  . Current regimen:   Glipizide 5 mg - 1 tablet daily with breakfast (AM)   Metformin 750 mg ER - 1 tablet daily with supper (PM) . Interventions: o Recommend checking blood glucose 1-2 hours after meals . Patient self care activities - Over the next 3 months, patient will: o Check blood sugar once daily at breakfast and occasionally 1-2 hours after meals, document, and provide at future appointments; Call if blood glucose > 200 after meals  o Contact provider with any episodes of hypoglycemia  Additional recommendations: . Remain up to date on vaccinations. Recommend seasonal influenza vaccine, tetanus (Tdap), and shingles vaccine 2-dose series (Shingrix). The Tdap and Shingrix are only covered if given at your pharmacy.  Initial goal documentation      Hypertension   CMP Latest Ref Rng & Units 10/19/2018 04/18/2017  Glucose 70 - 99 mg/dL 142(H) 96  BUN 6 - 23 mg/dL 17 12  Creatinine 0.40 - 1.50 mg/dL 0.87 0.71  Sodium 135 - 145 mEq/L 138 139  Potassium 3.5 - 5.1 mEq/L 4.4 4.0  Chloride 96 - 112 mEq/L 101 102  CO2 19 - 32 mEq/L 30 29  Calcium 8.4 - 10.5 mg/dL 9.6 9.7  Total Protein 6.0 - 8.3 g/dL 7.1 7.1  Total Bilirubin 0.2 - 1.2 mg/dL 0.8 0.8  Alkaline Phos 39 - 117 U/L 66 60  AST 0 - 37 U/L 21 18  ALT 0 - 53 U/L 27 24   Office blood pressures are: BP Readings from Last 3 Encounters:  06/21/19 136/62  05/13/19 138/60  10/19/18 124/76   BP goal < 140/90 mmHg Patient has failed these meds in the past: none reported Patient checks BP at home: infrequently  Patient home BP readings are ranging: none reported  Patient is currently controlled on the following medications:   Amlodipine 5 mg - 1 tablet daily (AM)  Lisinopril-HCTZ 20-12.5 mg - 2 tablets daily (AM)  We discussed:   Diet: chicken or  salmon with green beans and tomatoes; avoids pork/bbq, fried meats; eats a lot of sweets but trying to eat fruit or yogurt instead; drinks: Twist (sugar free), water   Exercise: difficult with leg pain, spends 1-2 hours doing yard work weekly, lots of house chores, stretching frequently  Plan: Continue current medications  Diabetes   Recent Relevant Labs: Lab Results  Component Value Date/Time   HGBA1C 7.3 (H) 10/19/2018 10:47 AM   HGBA1C 7.0 (A) 05/16/2018 08:52 AM   HGBA1C 6.5 (A) 11/08/2017 10:41 AM   HGBA1C 6.4 04/18/2017 12:39 PM    A1c goal < 7% Checking BG: checks once or twice a week (fasting) Recent FBG Readings: 114 (most recent), 138 (during prednisone course)  Patient has failed these meds in past: none reported Patient is currently uncontrolled on the following medications:   Glipizide 5 mg - 1 tablet daily with breakfast (AM)   Metformin 750 mg ER -  1 tablet daily with supper (PM)  Last diabetic eye exam:  Lab Results  Component Value Date/Time   HMDIABEYEEXA No Retinopathy 12/31/2018 12:00 AM    Last diabetic foot exam: reports checked at last AWV  Lab Results  Component Value Date/Time   HMDIABFOOTEX done 11/08/2017 12:00 AM    We discussed: reports has on current medications for > 5 years, discussed last A1c elevated although fasting BG within goal, recent 2 week course of prednisone raised readings but now returning to normal; suggested randomly checking BG 1-2 hours after meals, keeping log, and calling if BG > 200   Plan: Continue current medications; Recommend checking post-prandial readings to further assess blood glucose. Pt will call if post-prandial readings > 200. Recommend repeating A1c at next PCP visit.   Hyperlipidemia   Lipid Panel     Component Value Date/Time   CHOL 198 10/19/2018 1047   TRIG 396.0 (H) 10/19/2018 1047   HDL 37.20 (L) 10/19/2018 1047   CHOLHDL 5 10/19/2018 1047   VLDL 79.2 (H) 10/19/2018 1047   LDLDIRECT 94.0  10/19/2018 1047    CBC Latest Ref Rng & Units 10/19/2018 04/18/2017  WBC 4.0 - 10.5 K/uL 10.0 9.1  Hemoglobin 13.0 - 17.0 g/dL 13.8 13.9  Hematocrit 39.0 - 52.0 % 41.4 41.4  Platelets 150.0 - 400.0 K/uL 269.0 288.0   The 10-year ASCVD risk score Mikey Bussing DC Jr., et al., 2013) is: 39.5%   Values used to calculate the score:     Age: 25 years     Sex: Male     Is Non-Hispanic African American: No     Diabetic: Yes     Tobacco smoker: No     Systolic Blood Pressure: 0000000 mmHg     Is BP treated: Yes     HDL Cholesterol: 37.2 mg/dL     Total Cholesterol: 198 mg/dL   LDL goal < 100 Patient has failed these meds in past: none Patient is currently controlled on the following medications:   Atorvastatin 10 mg - 1 tablet weekly (Sundays)  Aspirin 81 mg - 1 tablet daily  We discussed:  --> Initial visit 06/13/19 - denies taking atorvastatin due to reading fact sheet, has not started it; we discussed risks and pt is willing to try atorvastatin 10 mg - 1 weekly --> Today 07/15/19 - pt started taking atorvastatin once weekly after last viist and hasn't noticed any adverse effects  Plan: Continue current medications. Recommend checking lipid panel in the next 2-3 weeks.   Leg Pain    Pain in lower back, both legs, and right shoulder  Patient has failed these meds in past: none reported Patient is currently uncontrolled on the following medications:   Duloxetine 30 mg - 1 tablet daily (not taking)  Tylenol Arthritis - 2 tablets once daily PRN  Pertinent history: Duloxetine started after last CCM visit 06/13/19 as patient wanted to try it for his chronic pain; he did not tolerate it well, has daily episodes of diarrhea requiring Lomotil, discontinued duloxetine after 2-3 week trial; his leg pain has become more severe and he has an MRI scheduled for 07/17/19; reports taking 2 Tylenol Arthritis daily   Plan: Continue to follow up with speciality. Consider additional treatment options following MRI.     Vaccines   Reviewed and discussed patient's vaccination history.    Immunization History  Administered Date(s) Administered  . Influenza,inj,Quad PF,6+ Mos 11/08/2017  . Influenza-Unspecified 12/30/2014, 02/09/2017  . Pneumococcal Conjugate-13 11/08/2017  .  Pneumococcal Polysaccharide-23 05/13/2014   Plan: Recommended patient receive seasonal influenza vaccine, Tdap, Shingrix. Reports received both doses of COVID-19 vaccine.   Medication Management  Misc: triamcinolone cream 0.1% - BID, prednisone 20 mg -completed course, mometasone 0.1% ointment  OTCs: multivitamin, Lomotil - 1 tablet daily PRN diarrhea  Pharmacy/Benefits: [employer] [facility]  Adherence: pillbox  Affordability: no concerns   CCM Follow Up: 10/08/19 at 10:00 AM (telephone)  Debbora Dus, PharmD Clinical Pharmacist [facility] [phone]

## 2019-07-15 NOTE — Patient Instructions (Addendum)
Jul 15, 2019  Dear Frank Vega,  Below is a summary of the goals we discussed at our telephone appointment on Jul 15, 2019. Please contact me anytime with questions or concerns.   Visit Information  Goals Addressed            This Visit's Progress   . Pharmacy Care Plan       CARE PLAN ENTRY  Current Barriers:  . Chronic Disease Management support, education, and care coordination needs related to Hypertension, Hyperlipidemia, and Diabetes   Hypertension . Pharmacist Clinical Goal(s): o Over the next 3 months, patient will work with PharmD and providers to maintain BP goal <140/90 . Current regimen:   Amlodipine 5 mg - 1 tablet daily (AM)  Lisinopril-HCTZ 20-12.5 mg - 2 tablets daily (AM) . Interventions: o Continue current medications . Patient self care activities - Over the next 3 months, patient will: o Work towards increasing exercise with goal of brisk walking or similar level activity 30 minutes, 5 days a week.  Hyperlipidemia . Pharmacist Clinical Goal(s): o Over the next 3 months, patient will work with PharmD and providers to maintain LDL goal < 100 . Current regimen:  o Atorvastatin 10 mg - 1 tablet weekly on Sundays . Interventions: o Continue current medications o Recommend lipid panel in the next 2-3 weeks  . Patient self care activities - Over the next 3 months, patient will: o Continue atorvastatin weekly as prescribed  Diabetes . Pharmacist Clinical Goal(s): o Over the next 3 months, patient will work with PharmD and providers to achieve A1c goal <7%, fasting blood glucose 80-130 mg/dL, post-meals < 180 mg/dL  . Current regimen:   Glipizide 5 mg - 1 tablet daily with breakfast (AM)   Metformin 750 mg ER - 1 tablet daily with supper (PM) . Interventions: o Recommend checking blood glucose 1-2 hours after meals . Patient self care activities - Over the next 3 months, patient will: o Check blood sugar once daily at breakfast and occasionally 1-2  hours after meals, document, and provide at future appointments; Call if blood glucose > 200 after meals  o Contact provider with any episodes of hypoglycemia  Additional recommendations: . Remain up to date on vaccinations. Recommend seasonal influenza vaccine, tetanus (Tdap), and shingles vaccine 2-dose series (Shingrix). The Tdap and Shingrix are only covered if given at your pharmacy.  Initial goal documentation      The patient verbalized understanding of instructions provided today and agreed to receive a mailed copy of patient instruction and/or educational materials. Telephone follow up appointment with pharmacy team member scheduled for:  10/08/19 at 10:00 AM (telephone)  Debbora Dus, PharmD Clinical Pharmacist [facility] [phone]  Diabetes Mellitus and Nutrition, Adult When you have diabetes (diabetes mellitus), it is very important to have healthy eating habits because your blood sugar (glucose) levels are greatly affected by what you eat and drink. Eating healthy foods in the appropriate amounts, at about the same times every day, can help you:  Control your blood glucose.  Lower your risk of heart disease.  Improve your blood pressure.  Reach or maintain a healthy weight. Every person with diabetes is different, and each person has different needs for a meal plan. Your health care provider may recommend that you work with a diet and nutrition specialist (dietitian) to make a meal plan that is best for you. Your meal plan may vary depending on factors such as:  The calories you  need.  The medicines you take.  Your weight.  Your blood glucose, blood pressure, and cholesterol levels.  Your activity level.  Other health conditions you have, such as heart or kidney disease. How do carbohydrates affect me? Carbohydrates, also called carbs, affect your blood glucose level more than any other type of food. Eating carbs naturally raises the  amount of glucose in your blood. Carb counting is a method for keeping track of how many carbs you eat. Counting carbs is important to keep your blood glucose at a healthy level, especially if you use insulin or take certain oral diabetes medicines. It is important to know how many carbs you can safely have in each meal. This is different for every person. Your dietitian can help you calculate how many carbs you should have at each meal and for each snack. Foods that contain carbs include:  Bread, cereal, rice, pasta, and crackers.  Potatoes and corn.  Peas, beans, and lentils.  Milk and yogurt.  Fruit and juice.  Desserts, such as cakes, cookies, ice cream, and candy. How does alcohol affect me? Alcohol can cause a sudden decrease in blood glucose (hypoglycemia), especially if you use insulin or take certain oral diabetes medicines. Hypoglycemia can be a life-threatening condition. Symptoms of hypoglycemia (sleepiness, dizziness, and confusion) are similar to symptoms of having too much alcohol. If your health care provider says that alcohol is safe for you, follow these guidelines:  Limit alcohol intake to no more than 1 drink per day for nonpregnant women and 2 drinks per day for men. One drink equals 12 oz of beer, 5 oz of wine, or 1 oz of hard liquor.  Do not drink on an empty stomach.  Keep yourself hydrated with water, diet soda, or unsweetened iced tea.  Keep in mind that regular soda, juice, and other mixers may contain a lot of sugar and must be counted as carbs. What are tips for following this plan?  Reading food labels  Start by checking the serving size on the "Nutrition Facts" label of packaged foods and drinks. The amount of calories, carbs, fats, and other nutrients listed on the label is based on one serving of the item. Many items contain more than one serving per package.  Check the total grams (g) of carbs in one serving. You can calculate the number of servings  of carbs in one serving by dividing the total carbs by 15. For example, if a food has 30 g of total carbs, it would be equal to 2 servings of carbs.  Check the number of grams (g) of saturated and trans fats in one serving. Choose foods that have low or no amount of these fats.  Check the number of milligrams (mg) of salt (sodium) in one serving. Most people should limit total sodium intake to less than 2,300 mg per day.  Always check the nutrition information of foods labeled as "low-fat" or "nonfat". These foods may be higher in added sugar or refined carbs and should be avoided.  Talk to your dietitian to identify your daily goals for nutrients listed on the label. Shopping  Avoid buying canned, premade, or processed foods. These foods tend to be high in fat, sodium, and added sugar.  Shop around the outside edge of the grocery store. This includes fresh fruits and vegetables, bulk grains, fresh meats, and fresh dairy. Cooking  Use low-heat cooking methods, such as baking, instead of high-heat cooking methods like deep frying.  Cook using healthy  oils, such as olive, canola, or sunflower oil.  Avoid cooking with butter, cream, or high-fat meats. Meal planning  Eat meals and snacks regularly, preferably at the same times every day. Avoid going long periods of time without eating.  Eat foods high in fiber, such as fresh fruits, vegetables, beans, and whole grains. Talk to your dietitian about how many servings of carbs you can eat at each meal.  Eat 4-6 ounces (oz) of lean protein each day, such as lean meat, chicken, fish, eggs, or tofu. One oz of lean protein is equal to: ? 1 oz of meat, chicken, or fish. ? 1 egg. ?  cup of tofu.  Eat some foods each day that contain healthy fats, such as avocado, nuts, seeds, and fish. Lifestyle  Check your blood glucose regularly.  Exercise regularly as told by your health care provider. This may include: ? 150 minutes of  moderate-intensity or vigorous-intensity exercise each week. This could be brisk walking, biking, or water aerobics. ? Stretching and doing strength exercises, such as yoga or weightlifting, at least 2 times a week.  Take medicines as told by your health care provider.  Do not use any products that contain nicotine or tobacco, such as cigarettes and e-cigarettes. If you need help quitting, ask your health care provider.  Work with a Social worker or diabetes educator to identify strategies to manage stress and any emotional and social challenges. Questions to ask a health care provider  Do I need to meet with a diabetes educator?  Do I need to meet with a dietitian?  What number can I call if I have questions?  When are the best times to check my blood glucose? Where to find more information:  American Diabetes Association: diabetes.org  Academy of Nutrition and Dietetics: www.eatright.[employer] of Diabetes and Digestive and Kidney Diseases (NIH): [employer] Summary  A healthy meal plan will help you control your blood glucose and maintain a healthy lifestyle.  Working with a diet and nutrition specialist (dietitian) can help you make a meal plan that is best for you.  Keep in mind that carbohydrates (carbs) and alcohol have immediate effects on your blood glucose levels. It is important to count carbs and to use alcohol carefully. This information is not intended to replace advice given to you by your health care provider. Make sure you discuss any questions you have with your health care provider. Document Revised: 02/03/2017 Document Reviewed: 03/28/2016 Elsevier Patient Education  2020 [employer].

## 2019-07-16 ENCOUNTER — Ambulatory Visit: Payer: Medicare Other | Admitting: Internal Medicine

## 2019-07-17 ENCOUNTER — Ambulatory Visit
Admission: RE | Admit: 2019-07-17 | Discharge: 2019-07-17 | Disposition: A | Discharge status: Home / Self Care | Payer: Medicare Other | Source: Ambulatory Visit | Attending: Physical Medicine & Rehabilitation | Admitting: Physical Medicine & Rehabilitation

## 2019-07-17 ENCOUNTER — Other Ambulatory Visit: Payer: Self-pay

## 2019-07-17 DIAGNOSIS — M545 Low back pain: Secondary | ICD-10-CM | POA: Diagnosis not present

## 2019-07-17 DIAGNOSIS — M5416 Radiculopathy, lumbar region: Secondary | ICD-10-CM | POA: Diagnosis not present

## 2019-10-08 ENCOUNTER — Other Ambulatory Visit: Payer: Self-pay

## 2019-10-08 ENCOUNTER — Ambulatory Visit: Payer: Medicare Other

## 2019-10-08 DIAGNOSIS — I10 Essential (primary) hypertension: Secondary | ICD-10-CM

## 2019-10-08 DIAGNOSIS — E1142 Type 2 diabetes mellitus with diabetic polyneuropathy: Secondary | ICD-10-CM

## 2019-10-08 NOTE — Chronic Care Management (AMB) (Signed)
Chronic Care Management Pharmacy  Name: Frank Vega  MRN: [id number] DOB: 1945/11/24  Chief Complaint/ HPI  Frank Vega,  74 y.o., male presents for their Follow-Up CCM visit with the clinical pharmacist via telephone.  PCP : Venia Carbon, MD  Their chronic conditions include: hypertension, hyperlipidemia, type 2 diabetes  Patient concerns: Reports his main concern is back pain. The epidural helped some, but unable to stand for long periods of time. Plans to follow up with physical medicine soon. Reports blood sugar and blood pressure have been good. Experiencing more diarrhea lately. We discussed trying a probiotic.   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), left hip replaced (2011)  Last CCM visit 07/15/19, no medication changes  Office Visits:  04/16/19: Patient phone call - sent amoxicillin for dental work   10/19/18: Frank Vega - continue current meds, work on leg strength and core   Consult Visit:  07/25/19: Physical medicine - Steroid injection given  07/19/19: Physical medicine - Chronic lower back pain and right leg pain, review MRI, pain 10/10, recommend epidural injection steroid, monitor BG 3-4 times daily, call provider for BG > 300  05/13/19: Frank Vega - chronic pain, 14 days of steroids  12/31/18: Endocrinology, diabetes - no note avaialble  Allergies  Allergen Reactions  . Paxil [Paroxetine Hcl]     Weight loss and jittery  . Shellfish Allergy Swelling  . Latex Rash   Medications: Outpatient Encounter Medications as of 10/08/2019  Medication Sig  . amLODipine (NORVASC) 5 MG tablet Take 1 tablet (5 mg total) by mouth daily.  Marland Kitchen amoxicillin (AMOXIL) 500 MG capsule Take all 4 tablets by mouth 1 hour before dental procedure  . aspirin 81 MG chewable tablet Chew 81 mg by mouth daily.  Marland Kitchen atorvastatin (LIPITOR) 10 MG tablet Take 1 tablet (10 mg total) by mouth once a week.  . chlorhexidine (PERIDEX) 0.12 % solution RINSE 15 ML BY MOUTH FOR 30 SECONDS  AND EXPECTORATE TWICE DAILY FOR 2 WEEKS  . diphenoxylate-atropine (LOMOTIL) 2.5-0.025 MG tablet Take 1 tablet by mouth daily as needed for diarrhea or loose stools.  . DULoxetine (CYMBALTA) 30 MG capsule Take 1 capsule (30 mg total) by mouth daily. (Patient not taking: Reported on 07/15/2019)  . glipiZIDE (GLUCOTROL XL) 5 MG 24 hr tablet TAKE 1 TABLET BY MOUTH EVERY DAY WITH BREAKFAST  . ibuprofen (ADVIL) 800 MG tablet Take 800 mg by mouth every 8 (eight) hours as needed. For pain after dental procedure  . lisinopril-hydrochlorothiazide (ZESTORETIC) 20-12.5 MG tablet Take 2 tablets by mouth daily.  . metFORMIN (GLUCOPHAGE-XR) 750 MG 24 hr tablet Take 1 tablet (750 mg total) by mouth daily with supper.  . mometasone (ELOCON) 0.1 % ointment Apply topically 2 (two) times daily.  . Multiple Vitamins-Minerals (MULTIVITAMIN MEN PO) Take by mouth.  . predniSONE (DELTASONE) 20 MG tablet Take 2 tablets (40 mg total) by mouth daily. For 5 days, then 1 tab daily for 5 days (Patient not taking: Reported on 07/15/2019)  . triamcinolone cream (KENALOG) 0.1 % Apply 1 application topically 2 (two) times daily as needed.   No facility-administered encounter medications on file as of 10/08/2019.   Current Diagnosis/Assessment: Goals    . Pharmacy Care Plan     CARE PLAN ENTRY  Current Barriers:  . Chronic Disease Management support, education, and care coordination needs related to Hyperlipidemia and Diabetes  Hyperlipidemia . Pharmacist Clinical Goal(s): o Over the next 3 months, patient will work with PharmD and providers  to maintain LDL goal < 100 . Current regimen:  o Atorvastatin 10 mg - 1 tablet weekly on Sundays . Interventions: o Continue current medications o Update lipid panel with annual wellness visit . Patient self care activities - Over the next 3 months, patient will: o Continue atorvastatin weekly as prescribed  Diabetes . Pharmacist Clinical Goal(s): o Over the next 3 months, patient  will work with PharmD and providers to achieve A1c goal <7%, fasting blood glucose 80-130 mg/dL, 2 hours after meals < 180 mg/dL  . Current regimen:   Glipizide 5 mg - 1 tablet daily with breakfast   Metformin 750 mg ER - 1 tablet daily with supper . Interventions: o Recommend checking blood glucose 2 hours after first bite of meal . Patient self care activities - Over the next 3 months, patient will: o Check blood sugar once daily at breakfast and occasionally 2 hours after meals, document, and provide at future appointments; Call if blood glucose > 200 after meals  o Contact provider with any episodes of hypoglycemia  Additional recommendations: . Remain up to date on vaccinations. Recommend seasonal influenza vaccine, tetanus (Tdap), and shingles vaccine 2-dose series (Shingrix). The Tdap and Shingrix are only covered if given at your pharmacy.  Please see past updates related to this goal by clicking on the "Past Updates" button in the selected goal       Hypertension   CMP Latest Ref Rng & Units 10/19/2018 04/18/2017  Glucose 70 - 99 mg/dL 142(H) 96  BUN 6 - 23 mg/dL 17 12  Creatinine 0.40 - 1.50 mg/dL 0.87 0.71  Sodium 135 - 145 mEq/L 138 139  Potassium 3.5 - 5.1 mEq/L 4.4 4.0  Chloride 96 - 112 mEq/L 101 102  CO2 19 - 32 mEq/L 30 29  Calcium 8.4 - 10.5 mg/dL 9.6 9.7  Total Protein 6.0 - 8.3 g/dL 7.1 7.1  Total Bilirubin 0.2 - 1.2 mg/dL 0.8 0.8  Alkaline Phos 39 - 117 U/L 66 60  AST 0 - 37 U/L 21 18  ALT 0 - 53 U/L 27 24   Office blood pressures are: BP Readings from Last 3 Encounters:  06/21/19 136/62  05/13/19 138/60  10/19/18 124/76   BP goal < 140/90 mmHg Patient has failed these meds in the past: none reported Patient checks BP at home: infrequently  Patient home BP readings are ranging: none reported  Patient is currently controlled on the following medications:   Amlodipine 5 mg - 1 tablet daily (AM)  Lisinopril-HCTZ 20-12.5 mg - 2 tablets daily  (AM)  We discussed: BP remains controlled at office visits  Plan: Continue current medications  Diabetes   Recent Relevant Labs: Lab Results  Component Value Date/Time   HGBA1C 7.3 (H) 10/19/2018 10:47 AM   HGBA1C 7.0 (A) 05/16/2018 08:52 AM   HGBA1C 6.5 (A) 11/08/2017 10:41 AM   HGBA1C 6.4 04/18/2017 12:39 PM    A1c goal < 7% Checking BG: checks once or twice a week (fasting) Recent FBG Readings:  137, 131, 129  After meals: has not checked lately   Patient has failed these meds in past: none reported Patient is currently uncontrolled on the following medications:   Glipizide 5 mg - 1 tablet daily with breakfast    Metformin 750 mg ER - 1 tablet daily with supper   Last diabetic eye exam:  Lab Results  Component Value Date/Time   HMDIABEYEEXA No Retinopathy 12/31/2018 12:00 AM    Last diabetic foot exam:  10/2018   We discussed: Reports BG was slightly elevated after steroid injection in May, up to 175 in the mornings. Fasting glucose readings near goal. Reminded patient to check post-prandial readings occasionally to ensure < 180.    Diet: chicken or salmon with green beans and tomatoes; avoids pork/bbq, fried meats; eats a lot of sweets but trying to eat fruit or yogurt instead; drinks: Twist (sugar free), water   Exercise: difficult with leg pain, spends 1-2 hours doing yard work weekly, lots of house chores, stretching frequently  Plan: Continue current medications; Recommend checking post-prandial readings to further assess blood glucose.  Recommend repeating A1c at AWV.  Hyperlipidemia   Lipid Panel     Component Value Date/Time   CHOL 198 10/19/2018 1047   TRIG 396.0 (H) 10/19/2018 1047   HDL 37.20 (L) 10/19/2018 1047   CHOLHDL 5 10/19/2018 1047   VLDL 79.2 (H) 10/19/2018 1047   LDLDIRECT 94.0 10/19/2018 1047    CBC Latest Ref Rng & Units 10/19/2018 04/18/2017  WBC 4.0 - 10.5 K/uL 10.0 9.1  Hemoglobin 13.0 - 17.0 g/dL 13.8 13.9  Hematocrit 39 - 52 %  41.4 41.4  Platelets 150 - 400 K/uL 269.0 288.0   The 10-year ASCVD risk score Mikey Bussing DC Jr., et al., 2013) is: 44.4%   Values used to calculate the score:     Age: 38 years     Sex: Male     Is Non-Hispanic African American: No     Diabetic: Yes     Tobacco smoker: No     Systolic Blood Pressure: 300 mmHg     Is BP treated: Yes     HDL Cholesterol: 37.2 mg/dL     Total Cholesterol: 198 mg/dL   LDL goal < 100 Patient has failed these meds in past: none Patient is currently controlled on the following medications:   Atorvastatin 10 mg - 1 tablet weekly (Sundays)  Aspirin 81 mg - 1 tablet daily  We discussed: pt reports still doing well on weekly statin --> Initial visit 06/13/19 - denies taking atorvastatin due to reading fact sheet, has not started it; we discussed risks and pt is willing to try atorvastatin 10 mg - 1 weekly --> 07/15/19 - pt started taking atorvastatin once weekly after last viist and hasn't noticed any adverse effects  Plan: Continue current medications. Recommend scheduling AWV with PCP. Due for annual labs.   Vaccines   Reviewed and discussed patient's vaccination history. Reports received both doses of COVID-19 vaccine.   Immunization History  Administered Date(s) Administered  . Influenza,inj,Quad PF,6+ Mos 11/08/2017  . Influenza-Unspecified 12/30/2014, 02/09/2017  . Pneumococcal Conjugate-13 11/08/2017  . Pneumococcal Polysaccharide-23 05/13/2014   Plan: Recommended patient receive seasonal influenza vaccine, Tdap, Shingrix.   Medication Management  Misc: triamcinolone cream 0.1% - BID, mometasone 0.1% ointment  OTCs: multivitamin, Lomotil - 1 tablet daily PRN diarrhea, reports worse recently, try probiotic   Pharmacy/Benefits: [employer] [facility]  Adherence: pillbox  Affordability: no concerns   CCM Follow Up: 6 months, telephone   Debbora Dus, PharmD Clinical Pharmacist [facility] [phone]

## 2019-10-08 NOTE — Patient Instructions (Addendum)
Dear Frank Vega,  Below is a summary of the goals we discussed during our follow up appointment on October 08, 2019. Please contact me anytime with questions or concerns.   Visit Information  Goals Addressed            This Visit's Progress   . Pharmacy Care Plan       CARE PLAN ENTRY  Current Barriers:  . Chronic Disease Management support, education, and care coordination needs related to Hyperlipidemia and Diabetes  Hyperlipidemia . Pharmacist Clinical Goal(s): o Over the next 3 months, patient will work with PharmD and providers to maintain LDL goal < 100 . Current regimen:  o Atorvastatin 10 mg - 1 tablet weekly on Sundays . Interventions: o Continue current medications o Update lipid panel with annual wellness visit . Patient self care activities - Over the next 3 months, patient will: o Continue atorvastatin weekly as prescribed  Diabetes . Pharmacist Clinical Goal(s): o Over the next 3 months, patient will work with PharmD and providers to achieve A1c goal <7%, fasting blood glucose 80-130 mg/dL, 2 hours after meals < 180 mg/dL  . Current regimen:   Glipizide 5 mg - 1 tablet daily with breakfast   Metformin 750 mg ER - 1 tablet daily with supper . Interventions: o Recommend checking blood glucose 2 hours after first bite of meal . Patient self care activities - Over the next 3 months, patient will: o Check blood sugar once daily at breakfast and occasionally 2 hours after meals, document, and provide at future appointments; Call if blood glucose > 200 after meals  o Contact provider with any episodes of hypoglycemia  Additional recommendations: . Remain up to date on vaccinations. Recommend seasonal influenza vaccine, tetanus (Tdap), and shingles vaccine 2-dose series (Shingrix). The Tdap and Shingrix are only covered if given at your pharmacy.  Please see past updates related to this goal by clicking on the "Past Updates" button in the selected goal         The patient verbalized understanding of instructions provided today and agreed to receive a mailed copy of patient instruction and/or educational materials.  Telephone follow up appointment with pharmacy team member scheduled for: April 14, 2020 at 9 AM (telephone visit)  Debbora Dus, PharmD Clinical Pharmacist [facility] [phone]   Basics of Medicine Management Taking your medicines correctly is an important part of managing or preventing medical problems. Make sure you know what disease or condition your medicine is treating, and how and when to take it. If you do not take your medicine correctly, it may not work well and may cause unpleasant side effects, including serious health problems. What should I do when I am taking medicines?   Read all the labels and inserts that come with your medicines. Review the information often.  Talk with your pharmacist if you get a refill and notice a change in the size, color, or shape of your medicines.  Know the potential side effects for each medicine that you take.  Try to get all your medicines from the same pharmacy. The pharmacist will have all your information and will understand how your medicines will affect each other (interact).  Tell your health care provider about all your medicines, including over-the-counter medicines, vitamins, and herbal or dietary supplements. He or she will make sure that nothing will interact with any of your prescribed medicines. How can I take my medicines safely?  Take medicines only as told  by your health care provider. ? Do not take more of your medicine than instructed. ? Do not take anyone else's medicines. ? Do not share your medicines with others. ? Do not stop taking your medicines unless your health care provider tells you to do so. ? You may need to avoid alcohol or certain foods or liquids when taking certain medicines. Follow your health care provider's  instructions.  Do not split, mash, or chew your medicines unless your health care provider tells you to do so. Tell your health care provider if you have trouble swallowing your medicines.  For liquid medicine, use the dosing container that was provided. How should I organize my medicines?  Know your medicines  Know what each of your medicines looks like. This includes size, color, and shape. Tell your health care provider if you are having trouble recognizing all the medicines that you are taking.  If you cannot tell your medicines apart because they look similar, keep them in original bottles.  If you cannot read the labels on the bottles, tell your pharmacist to put your medicines in containers with large print.  Review your medicines and your schedule with family members, a friend, or a caregiver. Use a pill organizer  Use a tool to organize your medicine schedule. Tools include a weekly pillbox, a written chart, a notebook, or a calendar.  Your tool should help you remember the following things about each medicine: ? The name of the medicine. ? The amount (dose) to take. ? The schedule. This is the day and time the medicine should be taken. ? The appearance. This includes color, shape, size, and stamp. ? How to take your medicines. This includes instructions to take them with food, without food, with fluids, or with other medicines.  Create reminders for taking your medicines. Use sticky notes, or alarms on your watch, mobile device, or phone calendar.  You may choose to use a more advanced management system. These systems have storage, alarms, and visual and audio prompts.  Some medicines can be taken on an "as-needed" basis. These include medicines for nausea or pain. If you take an as-needed medicine, write down the name and dose, as well as the date and time that you took it. How should I plan for travel?  Take your pillbox, medicines, and organization system with you when  traveling.  Have your medicines refilled before you travel. This will ensure that you do not run out of your medicines while you are away from home.  Always carry an updated list of your medicines with you. If there is an emergency, a first responder can quickly see what medicines you are taking.  Do not pack your medicines in checked luggage in case your luggage is lost or delayed.  If any of your medicines is considered a controlled substance, make sure you bring a letter from your health care provider with you. How should I store and discard my medicines? For safe storage:  Store medicines in a cool, dry area away from light, or as directed by your health care provider. Do not store medicines in the bathroom. Heat and humidity will affect them.  Do not store your medicines with other chemicals, or with medicines for pets or other household members.  Keep medicines away from children and pets. Do not leave them on counters or bedside tables. Store them in high cabinets or on high shelves. For safe disposal:  Check expiration dates regularly. Do not take expired medicines.  Discard medicines that are older than the expiration date.  Learn a safe way to dispose of your medicines. You may: ? Use a local government, hospital, or pharmacy medicine-take-back program. ? Mix the medicines with inedible substances, put them in a sealed bag or empty container, and throw them in the trash. What should I remember?  Tell your health care provider if you: ? Experience side effects. ? Have new symptoms. ? Have other concerns about taking your medicines.  Review your medicines regularly with your health care provider. Other medicines, diet, medical conditions, weight changes, and daily habits can all affect how medicines work. Ask if you need to continue taking each medicine, and discuss how well each one is working.  Refill your medicines early to avoid running out of them.  In case of an  accidental overdose, call your local [place] at [phone] or visit your local emergency department immediately. This is important. Summary  Taking your medicines correctly is an important part of managing or preventing medical problems.  You need to make sure that you understand what you are taking a medicine for, as well as how and when you need to take it.  Know your medicines and use a pill organizer to help you take your medicines correctly.  In case of an accidental overdose, call your local [place] at [phone] or visit your local emergency department immediately. This is important. This information is not intended to replace advice given to you by your health care provider. Make sure you discuss any questions you have with your health care provider. Document Revised: 02/16/2017 Document Reviewed: 02/16/2017 Elsevier Patient Education  2020 [employer].

## 2019-11-22 ENCOUNTER — Telehealth: Payer: Self-pay

## 2019-11-22 NOTE — Telephone Encounter (Signed)
Noted. Good idea to have virtual visit just to evaluate

## 2019-11-22 NOTE — Telephone Encounter (Signed)
I spoke with Frank Vega; Frank Vega has had abd pain and Watery diarrhea since 11/17/19; Frank Vega is feeling more fatigued than normal; no more SOB than usual; no CP or cough; Frank Vega has lost his taste; Frank Vega has S/T first thing in morning but after drinking juice that goes away;  no other covid symptoms.Frank Vega said there are some people at there church that has had + covid but had finished their quarantining; Frank Vega was wearing a mask and social distance when saw those people at church. Frank Vega has had both covid vaccines since early March. Frank Vega is drinking plenty of fluid, resting, will take tylenol if spike fever and will self quarantine. Frank Vega does not feel in any distress at this time and UC & ED precautions given and Frank Vega voiced understanding. Frank Vega will keep virtual appt on 11/23/19 at 12:40 with Dr Jonni Sanger. Will send note to Dr Jonni Sanger and Dr Silvio Pate.

## 2019-11-22 NOTE — Telephone Encounter (Signed)
Noted and thanks.  Would recommend him getting testing for covid.

## 2019-11-22 NOTE — Telephone Encounter (Signed)
I spoke with pts wife;(DPR signed); she said they would probably get tested for covid on 11/23/19; she is not dressed and does not feel like going out tonight with pt; FYI to Dr Jonni Sanger.

## 2019-11-23 ENCOUNTER — Telehealth (INDEPENDENT_AMBULATORY_CARE_PROVIDER_SITE_OTHER): Payer: Medicare Other | Admitting: Family Medicine

## 2019-11-23 ENCOUNTER — Encounter: Payer: Self-pay | Admitting: Family Medicine

## 2019-11-23 ENCOUNTER — Other Ambulatory Visit: Payer: Self-pay

## 2019-11-23 VITALS — BP 125/65 | HR 94

## 2019-11-23 DIAGNOSIS — Z20822 Contact with and (suspected) exposure to covid-19: Secondary | ICD-10-CM

## 2019-11-23 NOTE — Progress Notes (Signed)
TELEPHONE ENCOUNTER   Patient verbally agreed to telephone visit and is aware that copayment and coinsurance may apply. Patient was treated using telemedicine according to accepted telemedicine protocols.  Location of the patient: home Location of provider: [facility], [place] of all persons participating in the telemedicine service and role in the encounter: Leamon Arnt, MD Reymundo Poll, cma   Subjective  CC:  Chief Complaint  Patient presents with  . COVID symptoms    symptoms started 11/17/19, fully vaccinated, no recent COVID test - unable to find available testing    HPI: Frank Vega is a 74 y.o. male who was telephoned today to address the problems listed above in the chief complaint.  covid sxs: loss of taste, diarrhea, and nasal congestion that started approximately 6 days ago and are now improving.  He was able to taste his breakfast this morning.  She has very minimal nasal congestion without sinus pain, postnasal drainage, cough, shortness of breath, chest pain.  He has mild diarrhea that is also improving.  Has not had a bowel movement today.  There is no blood in the stool there is no mucus in the stool.  He does not have nausea or vomiting.  He is fully vaccinated with last vaccine taking in March.  Multiple church members have had Covid in the last month.  He has not been able to find an available testing site.  His appetite is good.  ASSESSMENT: 1. Suspected COVID-19 virus infection      Likely Covid infection and vaccinated adult, high risk: Fortunately, his symptoms are minimal.  They are improving.  At this point continue to monitor.  Supportive care.  Follow-up if worsening. Time spent with the patient (non face-to-face time during this virtual encounter): 15 minutes, spent in obtaining information about his symptoms, reviewing his previous labs, evaluations, and treatments, counseling him about his condition (please see the discussed  topics above), and developing a plan to further investigate it; the patient was provided an opportunity to ask questions and all were answered. The patient agreed with the plan and demonstrated an understanding of the instructions.   The patient was advised to call back or seek an in-person evaluation if the symptoms worsen or if the condition fails to improve as anticipated.  Follow up: No follow-ups on file.  Visit date not found  No orders of the defined types were placed in this encounter.  No orders of the defined types were placed in this encounter.    I reviewed the patients updated PMH, FH, and SocHx.    Patient Active Problem List   Diagnosis Date Noted  . Lumbar radiculopathy 06/21/2019  . Back pain 10/19/2018  . Rash 10/19/2018  . Phimosis 10/19/2018  . Edema 05/16/2018  . 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 Preventative health care 11/08/2017  . Advance directive discussed with patient 11/08/2017  . 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 Chronic venous insufficiency 06/27/2017  . 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 Hypertension   . 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 IBS (irritable bowel syndrome)    Current Meds  Medication Sig  . amLODipine (NORVASC) 5 MG tablet Take 1 tablet (5 mg total) by mouth daily.  Marland Kitchen aspirin 81 MG chewable tablet Chew 81 mg by mouth daily.  Marland Kitchen atorvastatin (LIPITOR) 10 MG tablet Take 1 tablet (10 mg total) by mouth once a  week.  . diphenoxylate-atropine (LOMOTIL) 2.5-0.025 MG tablet Take 1 tablet by mouth daily as needed for diarrhea or loose stools.  Marland Kitchen glipiZIDE (GLUCOTROL XL) 5 MG 24 hr tablet TAKE 1 TABLET BY MOUTH EVERY DAY WITH BREAKFAST  . ibuprofen (ADVIL) 800 MG tablet Take 800 mg by mouth every 8 (eight) hours as needed. For pain after dental procedure  . lisinopril-hydrochlorothiazide (ZESTORETIC) 20-12.5 MG tablet Take 2 tablets by  mouth daily.  . metFORMIN (GLUCOPHAGE-XR) 750 MG 24 hr tablet Take 1 tablet (750 mg total) by mouth daily with supper.  . mometasone (ELOCON) 0.1 % ointment Apply topically 2 (two) times daily.  . Multiple Vitamins-Minerals (MULTIVITAMIN MEN PO) Take by mouth.  . triamcinolone cream (KENALOG) 0.1 % Apply 1 application topically 2 (two) times daily as needed.    Allergies: Patient is allergic to paxil [paroxetine hcl], shellfish allergy, and latex. Family History: Patient family history includes Arthritis in his mother; Cancer in his father; Dementia in his mother; Heart attack in his father; Heart disease in his father; Hypertension in his mother. Social History:  Patient  reports that he has never smoked. He has never used smokeless tobacco. He reports that he does not drink alcohol and does not use drugs.  Review of Systems: Constitutional: Negative for fever malaise or anorexia Cardiovascular: negative for chest pain Respiratory: negative for SOB or persistent cough Gastrointestinal: negative for abdominal pain     [id number] physician/qualified health professional telephone evaluation 5 to 10 minutes [id number] physician/qualified help functional [place] evaluation for 11 to 20 minutes [id number] physician/qualify he will professional telephone evaluation for 21 to 30 minutes

## 2019-11-23 NOTE — Progress Notes (Signed)
I have discussed the procedure for the virtual visit with the patient who has given consent to proceed with assessment and treatment.   Jodell Cipro, CMA

## 2019-11-25 NOTE — Telephone Encounter (Signed)
Is he improved? Was he tested?

## 2019-11-25 NOTE — Telephone Encounter (Signed)
[facility] - Client TELEPHONE ADVICE RECORD [facility] Patient Name: Frank Vega Gender: Male DOB: Jun 03, 1945 Age: 74 Y 21 M 24 D Return Phone Number: [phone] (Primary) Address: City/State/Zip: [place] [address] Client [facility] - Client Client Site [place] - Day Physician Viviana Simpler- MD Contact Type Call Who Is Calling Patient / Member / Family / Caregiver Call Type Triage / Clinical Relationship To Patient Self Return Phone Number [phone] (Primary) Chief Complaint Diarrhea Reason for Call Symptomatic / Request for [place] has diarrhea and no taste Translation No Nurse Assessment Nurse: Reasor, RN, Leah Date/Time (Eastern Time): 11/22/2019 3:01:14 PM Confirm and document reason for call. If symptomatic, describe symptoms. ---Caller c/o diarrhea and no taste since Sunday. No fever, no vomiting, no other s/sx Has the patient had close contact with a person known or suspected to have the novel coronavirus illness OR traveled / lives in area with major community spread (including international travel) in the last 14 days from the onset of symptoms? * If Asymptomatic, screen for exposure and travel within the last 14 days. ---No Does the patient have any new or worsening symptoms? ---Yes Will a triage be completed? ---Yes Related visit to physician within the last 2 weeks? ---No Does the PT have any chronic conditions? (i.e. diabetes, asthma, this includes High risk factors for pregnancy, etc.) ---Yes List chronic conditions. ---diabetes HTN Is this a behavioral health or substance abuse call? ---No Guidelines Guideline Title Affirmed Question Affirmed Notes Nurse Date/Time (Eastern Time) Diarrhea [1] MODERATE diarrhea (e.g., 4-6 times / day more than normal) AND [2] present > 48 hours (2 days) Reasor, RN, Denny Peon 11/22/2019 3:04:06 PM Disp.  Time Eilene Ghazi Time) Disposition Final User 11/22/2019 3:11:58 PM See PCP within 24 Hours Yes Reasor, RN, LeahPLEASE NOTE: All timestamps contained within this report are represented as [place] Standard Time. CONFIDENTIALTY NOTICE: This fax transmission is intended only for the addressee. It contains information that is legally privileged, confidential or otherwise protected from use or disclosure. If you are not the intended recipient, you are strictly prohibited from reviewing, disclosing, copying using or disseminating any of this information or taking any action in reliance on or regarding this information. If you have received this fax in error, please notify us immediately by telephone so that we can arrange for its return to [place]. Phone: [phone], Toll-Free: [phone], Fax: [phone] Page: 2 of 2 Call Id: [id number] [place] Disagree/Comply Comply Caller Understands Yes PreDisposition [place] Advice Given Per Guideline SEE PCP WITHIN 24 HOURS: * IF OFFICE WILL BE CLOSED: You need to be seen within the next 24 hours. A clinic or an urgent care center is often a good source of care if your doctor's office is closed or you can't get an appointment. CALL BACK IF: * You become worse CARE ADVICE given per Diarrhea (Adult) guideline. Pt warm-transferred per client directives Referrals Warm transfer to backline

## 2019-11-25 NOTE — Telephone Encounter (Signed)
LVM

## 2019-11-26 NOTE — Telephone Encounter (Signed)
Pt reports he has lost sense of taste and had diarrhea starting 10 days ago along with fatigue. Pt reports 5 days ago he started to feel better and only still had fatigue with no other symptoms. He did have a VV on Saturday who advised he have a covid test. He and his wife tried all weekend to get a test but could not find an opening. His wife's pulmonologist found her an opening at [facility], but he has not been tested. He is not sure if he should bother to be tested now that he has no symptoms. Wife's covid result will not be back for 1-2 days. Advised pt that [facility] has testing sites and apts available tomorrow. Pt said he would wait to see what Dr. Silvio Pate said he should do.  Pt would like to know if PCP recommends pt still gets tested. Pt would also like to know if PCP recommends the covid booster.   Advised pt if he develops any new symptoms to contact the office. Advised this office will f/u tomorrow. Pt verbalized understanding.

## 2019-11-27 NOTE — Telephone Encounter (Signed)
If he feels better and his wife has already been tested---I don't think it is going to matter about him being tested. I do think the general recommendation would be for a booster for those over age 74----so yes, I would recommend he get a booster when it is available.

## 2019-11-27 NOTE — Telephone Encounter (Signed)
Advised pt of PCP msg. Advised if anything changes to contact this office. Pt verbalized understanding.

## 2019-12-03 ENCOUNTER — Telehealth: Payer: Self-pay

## 2019-12-03 MED ORDER — ONETOUCH ULTRA VI STRP: ORAL_STRIP | 4 refills | Status: DC

## 2019-12-03 MED ORDER — ONETOUCH DELICA LANCETS 30G MISC: 1.0000 | Freq: Every day | 3 refills | Status: AC

## 2019-12-03 NOTE — Telephone Encounter (Signed)
Pt's wife called stating he needs refills of test strips and lancets. We did not have them on file.   One Touch Ultra Mini Meter and One Touch Delica Lancet to [employer]. [employer].

## 2019-12-09 ENCOUNTER — Telehealth: Payer: Self-pay

## 2019-12-09 NOTE — Telephone Encounter (Signed)
Okay--check with him tomorrow about whether he still needs the appt since I am otherwise full for the week

## 2019-12-09 NOTE — Telephone Encounter (Signed)
[facility] - Client TELEPHONE ADVICE RECORD [facility] Patient Name: Frank Vega Gender: Male DOB: [birth year] Age: 74 Y 75 M 7 D Return Phone Number: [phone] (Primary) Address: City/State/Zip: [place]  [address] Client [facility] - Client Client Site [place] - Day Physician Viviana Simpler- MD Contact Type Call Who Is Calling Patient / Member / Family / Caregiver Call Type Triage / Clinical Relationship To Patient Self Return Phone Number [phone] (Primary) Chief Complaint Back Pain - General Reason for Call Symptomatic / Request for Health Information Initial Comment Caller states is Frank Vega with [facility] Pt is having lower middle back pain in one spot on the left side for about a week and only has one kidney. Translation No Nurse Assessment Nurse: Lenon Curt, RN, Melanie Date/Time (Eastern Time): 12/06/2019 3:12:54 PM Confirm and document reason for call. If symptomatic, describe symptoms. ---Pt is having lower middle back pain in one spot on the left side for about a week. No trouble with bowel or bladder. Out of covid quarantine on Monday. Past injections a few years ago but not the same thing. IBS issues. Sitting a certain way makes it more difficult to get up or turn over in the bed. 175 feet up hill from the house to drop off the garbage. Does the patient have any new or worsening symptoms? ---Yes Will a triage be completed? ---Yes Related visit to physician within the last 2 weeks? ---No Does the PT have any chronic conditions? (i.e. diabetes, asthma, this includes High risk factors for pregnancy, etc.) ---Yes List chronic conditions. ---IBS Is this a behavioral health or substance abuse call? ---No Guidelines Guideline Title Affirmed Question Affirmed Notes Nurse Date/Time (Eastern Time) Back Pain [1] MODERATE back pain (e.g., interferes with normal activities)  AND [2] present > 3 days Allean Found 12/06/2019 3:18:13 PM Disp. Time Eilene Ghazi Time) Disposition Final User 12/06/2019 3:21:08 PM SEE PCP WITHIN 3 DAYS Yes Coley, RN, MelaniePLEASE NOTE: All timestamps contained within this report are represented as [place] Standard Time. CONFIDENTIALTY NOTICE: This fax transmission is intended only for the addressee. It contains information that is legally privileged, confidential or otherwise protected from use or disclosure. If you are not the intended recipient, you are strictly prohibited from reviewing, disclosing, copying using or disseminating any of this information or taking any action in reliance on or regarding this information. If you have received this fax in error, please notify us immediately by telephone so that we can arrange for its return to [place]. Phone: [phone], Toll-Free: [phone], Fax: [phone] Page: 2 of 2 Call Id: [id number] [place] Disagree/Comply Comply Caller Understands Yes PreDisposition Call Doctor Care Advice Given Per Guideline SEE PCP WITHIN 3 DAYS: * You need to be seen within 2 or 3 days. * PCP VISIT: Call your doctor (or NP/PA) during regular office hours and make an appointment. A clinic or urgent care center are good places to go for care if your doctor's office is closed or you can't get an appointment. NOTE: If office will be open tomorrow, tell caller to call then, not in 3 days. USE HEAT: ACTIVITY: * Continue ordinary activities as much as your pain permits. Continued activity is more healing for the back than rest. * Avoid any activities that cause severe pain. * Use caution and commonsense when performing heavy lifting. Similarly, be careful during strenuous exercise. * Note: complete bed rest is unnecessary. * IBUPROFEN (E.G., MOTRIN, ADVIL): Take 400  mg (two 200 mg pills) by mouth every 6 hours. The most you should take each day is 1,200 mg (six 200 mg pills), unless your doctor has told you to take  more. CALL BACK IF: * Numbness or weakness occurs, or bowel/bladder problems * There are any urine symptoms or fever * You become worse CARE ADVICE given per Back Pain (Adult) guideline.

## 2019-12-09 NOTE — Telephone Encounter (Signed)
Pt c/o left lower back pain for one week similar to a pulled muscle. He reports this as different than the normal middle of the back pain where he has gotten back inj in the past. He reports this morning that the pain is better than Friday and is not sure if he really needs an apt. Advised pt there are openings in PCP schedule for Wed and they would fill up fast and if he decided he didn't need the apt he could cancel it. Advised if the pain got any worse to contact the office. Pt verbalized understanding  Made OV for pt for 12/11/19

## 2019-12-10 NOTE — Telephone Encounter (Signed)
Spoke to pt. He is keeping the appt.

## 2019-12-11 ENCOUNTER — Other Ambulatory Visit: Payer: Self-pay

## 2019-12-11 ENCOUNTER — Encounter: Payer: Self-pay | Admitting: Internal Medicine

## 2019-12-11 ENCOUNTER — Ambulatory Visit (INDEPENDENT_AMBULATORY_CARE_PROVIDER_SITE_OTHER): Payer: Medicare Other | Admitting: Internal Medicine

## 2019-12-11 VITALS — BP 124/64 | HR 81 | Temp 98.2°F | Ht 69.0 in | Wt 224.0 lb

## 2019-12-11 DIAGNOSIS — Z23 Encounter for immunization: Secondary | ICD-10-CM | POA: Diagnosis not present

## 2019-12-11 DIAGNOSIS — E1142 Type 2 diabetes mellitus with diabetic polyneuropathy: Secondary | ICD-10-CM | POA: Diagnosis not present

## 2019-12-11 DIAGNOSIS — R109 Unspecified abdominal pain: Secondary | ICD-10-CM | POA: Diagnosis not present

## 2019-12-11 DIAGNOSIS — I10 Essential (primary) hypertension: Secondary | ICD-10-CM | POA: Diagnosis not present

## 2019-12-11 LAB — POCT GLYCOSYLATED HEMOGLOBIN (HGB A1C): Hemoglobin A1C: 6.9 % — AB (ref 4.0–5.6)

## 2019-12-11 MED ORDER — CHLORHEXIDINE GLUCONATE 0.12 % MT SOLN: OROMUCOSAL | 0 refills | Status: DC

## 2019-12-11 NOTE — Assessment & Plan Note (Signed)
BP Readings from Last 3 Encounters:  12/11/19 124/64  11/23/19 125/65  06/21/19 136/62   Control good on lisinopril/HCTZ Will check full labs at regular check up in January

## 2019-12-11 NOTE — Assessment & Plan Note (Addendum)
Seems to still have good control on glipizide and metformin Will check A1c since very overdue for this  Lab Results  Component Value Date   HGBA1C 6.9 (A) 12/11/2019   Control is good No med changes needed

## 2019-12-11 NOTE — Addendum Note (Signed)
Addended by: Pilar Grammes on: 12/11/2019 10:46 AM   Modules accepted: Orders

## 2019-12-11 NOTE — Assessment & Plan Note (Signed)
Not radicular or related to spine or hip Seems muscular Can try heat---especially before bed---and tylenol

## 2019-12-11 NOTE — Progress Notes (Signed)
Subjective:    Patient ID: Frank Vega, male    DOB: Jun 25, 1945, 74 y.o.   MRN: [id number]  HPI Here due to left low back pain This visit occurred during the SARS-CoV-2 public health emergency.  Safety protocols were in place, including screening questions prior to the visit, additional usage of staff PPE, and extensive cleaning of exam room while observing appropriate contact time as indicated for disinfecting solutions.   Having left lateral lumbar pain--over hip posteriorly Started about 2 weeks ago No injury or new tasks to account for this----carries groceries, mows yard (nothing new) Not throbbing---especially noticeable in the morning "I know something is there" Does loosen up ---then just intermittent No radiation No leg weakness  Has felt washed out lately---since suspected COVID infection (had lost his taste) Taste now just came back  Checks sugars intermittently This morning it was 143--this is high for him Mild neuropathy only--fingers and feet  Current Outpatient Medications on File Prior to Visit  Medication Sig Dispense Refill  . amLODipine (NORVASC) 5 MG tablet Take 1 tablet (5 mg total) by mouth daily. 90 tablet 3  . amoxicillin (AMOXIL) 500 MG capsule Take all 4 tablets by mouth 1 hour before dental procedure 4 capsule 2  . aspirin 81 MG chewable tablet Chew 81 mg by mouth daily.    Marland Kitchen atorvastatin (LIPITOR) 10 MG tablet Take 1 tablet (10 mg total) by mouth once a week. 13 tablet 3  . diphenoxylate-atropine (LOMOTIL) 2.5-0.025 MG tablet Take 1 tablet by mouth daily as needed for diarrhea or loose stools. 30 tablet 0  . glipiZIDE (GLUCOTROL XL) 5 MG 24 hr tablet TAKE 1 TABLET BY MOUTH EVERY DAY WITH BREAKFAST 90 tablet 3  . glucose blood (ONETOUCH ULTRA) test strip Use to check blood sugar once a day. Dx Code E11.42 100 each 4  . ibuprofen (ADVIL) 800 MG tablet Take 800 mg by mouth every 8 (eight) hours as needed. For pain after dental procedure    .  lisinopril-hydrochlorothiazide (ZESTORETIC) 20-12.5 MG tablet Take 2 tablets by mouth daily. 180 tablet 3  . metFORMIN (GLUCOPHAGE-XR) 750 MG 24 hr tablet Take 1 tablet (750 mg total) by mouth daily with supper. 90 tablet 3  . mometasone (ELOCON) 0.1 % ointment Apply topically 2 (two) times daily.    . Multiple Vitamins-Minerals (MULTIVITAMIN MEN PO) Take by mouth.    Glory Rosebush Delica Lancets 40G MISC 1 each by Does not apply route daily. Dx Code E11.42 100 each 3  . triamcinolone cream (KENALOG) 0.1 % Apply 1 application topically 2 (two) times daily as needed. 45 g 1   No current facility-administered medications on file prior to visit.    Allergies  Allergen Reactions  . Paxil [Paroxetine Hcl]     Weight loss and jittery  . Shellfish Allergy Swelling  . Latex Rash    Past Medical History:  Diagnosis Date  . 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 Diabetes mellitus with polyneuropathy ([place])   . GERD (gastroesophageal reflux disease)   . Hypertension   . IBS (irritable bowel syndrome)   . Osteoarthritis of multiple joints     Past Surgical History:  Procedure Laterality Date  . COLONOSCOPY WITH PROPOFOL N/A 07/18/2017   Procedure: COLONOSCOPY WITH PROPOFOL;  Surgeon: Lollie Sails, MD;  Location: [facility] ENDOSCOPY;  Service: Endoscopy;  Laterality: N/A;  . CYST EXCISION  1952   left leg  . KNEE ARTHROSCOPY Left 2011  .  REPLACEMENT TOTAL KNEE Left   . TOTAL HIP ARTHROPLASTY Right     Family History  Problem Relation Age of Onset  . Arthritis Mother   . Hypertension Mother   . Dementia Mother   . Heart attack Father   . Heart disease Father   . Cancer Father        lymphoma    Social History   Socioeconomic History  . Marital status: Married    Spouse name: Not on file  . Number of children: 1  . Years of education: Not on file  . Highest education level: Not on file  Occupational History  . Occupation: Electrical engineer    Comment:  Retired  Tobacco Use  . Smoking status: Never Smoker  . Smokeless tobacco: Never Used  Vaping Use  . Vaping Use: Never used  Substance and Sexual Activity  . Alcohol use: No  . Drug use: No  . Sexual activity: Never  Other Topics Concern  . Not on file  Social History Narrative   1 daughter--local      No living will   Wife, then daughter, should make health care decisions   Would accept resuscitation   Not sure about tube feeds   Social Determinants of Health   Financial Resource Strain: Low Risk   . Difficulty of Paying Living Expenses: Not very hard  Food Insecurity:   . Worried About Charity fundraiser in the Last Year: Not on file  . Ran Out of Food in the Last Year: Not on file  Transportation Needs:   . Lack of Transportation (Medical): Not on file  . Lack of Transportation (Non-Medical): Not on file  Physical Activity:   . Days of Exercise per Week: Not on file  . Minutes of Exercise per Session: Not on file  Stress:   . Feeling of Stress : Not on file  Social Connections:   . Frequency of Communication with Friends and Family: Not on file  . Frequency of Social Gatherings with Friends and Family: Not on file  . Attends Religious Services: Not on file  . Active Member of Clubs or Organizations: Not on file  . Attends Archivist Meetings: Not on file  . Marital Status: Not on file  Intimate Partner Violence:   . Fear of Current or Ex-Partner: Not on file  . Emotionally Abused: Not on file  . Physically Abused: Not on file  . Sexually Abused: Not on file   Review of Systems Same BP meds No fevers No dysuria or change in urinary stream    Objective:   Physical Exam Constitutional:      Appearance: Normal appearance.  Musculoskeletal:     Comments: Tenderness along lumbar flank--not at hip No spine tenderness SLR negative Normal ROM in left hip and no bursa tenderness Very limited back flexion  Neurological:     Mental Status: He is  alert.     Comments: Gait normal No weakness in legs            Assessment & Plan:

## 2020-01-01 DIAGNOSIS — G8929 Other chronic pain: Secondary | ICD-10-CM | POA: Diagnosis not present

## 2020-01-01 DIAGNOSIS — E119 Type 2 diabetes mellitus without complications: Secondary | ICD-10-CM | POA: Diagnosis not present

## 2020-01-01 DIAGNOSIS — M5441 Lumbago with sciatica, right side: Secondary | ICD-10-CM | POA: Diagnosis not present

## 2020-01-15 DIAGNOSIS — M5441 Lumbago with sciatica, right side: Secondary | ICD-10-CM | POA: Diagnosis not present

## 2020-01-15 DIAGNOSIS — G8929 Other chronic pain: Secondary | ICD-10-CM | POA: Diagnosis not present

## 2020-01-15 DIAGNOSIS — M48061 Spinal stenosis, lumbar region without neurogenic claudication: Secondary | ICD-10-CM | POA: Diagnosis not present

## 2020-01-24 DIAGNOSIS — M5441 Lumbago with sciatica, right side: Secondary | ICD-10-CM | POA: Diagnosis not present

## 2020-01-24 DIAGNOSIS — M48061 Spinal stenosis, lumbar region without neurogenic claudication: Secondary | ICD-10-CM | POA: Diagnosis not present

## 2020-01-24 DIAGNOSIS — E119 Type 2 diabetes mellitus without complications: Secondary | ICD-10-CM | POA: Diagnosis not present

## 2020-02-14 ENCOUNTER — Telehealth: Payer: Self-pay | Admitting: Internal Medicine

## 2020-02-14 MED ORDER — TRIAMCINOLONE ACETONIDE 0.1 % EX CREA: 1.0000 "application " | TOPICAL_CREAM | Freq: Two times a day (BID) | CUTANEOUS | 1 refills | Status: DC | PRN

## 2020-02-14 NOTE — Telephone Encounter (Signed)
Pt called in wanted to know about getting aanother cream fr his neck spots, they sent a mouthrise and he needs a cream Triamcinolone cream   Pharmacy : [facility] [address]

## 2020-02-14 NOTE — Telephone Encounter (Signed)
Rx sent electronically.  

## 2020-02-19 ENCOUNTER — Other Ambulatory Visit: Payer: Self-pay | Admitting: Internal Medicine

## 2020-02-25 DIAGNOSIS — M48061 Spinal stenosis, lumbar region without neurogenic claudication: Secondary | ICD-10-CM | POA: Diagnosis not present

## 2020-02-25 DIAGNOSIS — M4726 Other spondylosis with radiculopathy, lumbar region: Secondary | ICD-10-CM | POA: Diagnosis not present

## 2020-02-25 DIAGNOSIS — M5116 Intervertebral disc disorders with radiculopathy, lumbar region: Secondary | ICD-10-CM | POA: Diagnosis not present

## 2020-02-25 DIAGNOSIS — M5126 Other intervertebral disc displacement, lumbar region: Secondary | ICD-10-CM | POA: Diagnosis not present

## 2020-02-25 DIAGNOSIS — Z6832 Body mass index (BMI) 32.0-32.9, adult: Secondary | ICD-10-CM | POA: Diagnosis not present

## 2020-02-25 DIAGNOSIS — M5416 Radiculopathy, lumbar region: Secondary | ICD-10-CM | POA: Diagnosis not present

## 2020-03-01 ENCOUNTER — Other Ambulatory Visit: Payer: Self-pay | Admitting: Internal Medicine

## 2020-03-09 ENCOUNTER — Other Ambulatory Visit: Payer: Self-pay | Admitting: Internal Medicine

## 2020-03-09 NOTE — Telephone Encounter (Signed)
Last filled 07-12-19 #30 Last OV 12-11-19 Next OV 03-31-20 [facility] [address]

## 2020-03-31 ENCOUNTER — Encounter: Payer: Self-pay | Admitting: Internal Medicine

## 2020-03-31 ENCOUNTER — Ambulatory Visit (INDEPENDENT_AMBULATORY_CARE_PROVIDER_SITE_OTHER): Payer: Medicare Other | Admitting: Internal Medicine

## 2020-03-31 ENCOUNTER — Other Ambulatory Visit: Payer: Self-pay

## 2020-03-31 VITALS — BP 108/64 | HR 70 | Temp 98.1°F | Ht 68.5 in | Wt 212.0 lb

## 2020-03-31 DIAGNOSIS — F39 Unspecified mood [affective] disorder: Secondary | ICD-10-CM

## 2020-03-31 DIAGNOSIS — M5416 Radiculopathy, lumbar region: Secondary | ICD-10-CM | POA: Diagnosis not present

## 2020-03-31 DIAGNOSIS — I10 Essential (primary) hypertension: Secondary | ICD-10-CM

## 2020-03-31 DIAGNOSIS — Z0000 Encounter for general adult medical examination without abnormal findings: Secondary | ICD-10-CM

## 2020-03-31 DIAGNOSIS — Z7189 Other specified counseling: Secondary | ICD-10-CM | POA: Diagnosis not present

## 2020-03-31 DIAGNOSIS — E1142 Type 2 diabetes mellitus with diabetic polyneuropathy: Secondary | ICD-10-CM

## 2020-03-31 DIAGNOSIS — Z125 Encounter for screening for malignant neoplasm of prostate: Secondary | ICD-10-CM | POA: Diagnosis not present

## 2020-03-31 LAB — LIPID PANEL
Cholesterol: 157 mg/dL (ref 0–200)
HDL: 44.3 mg/dL (ref >39.00)
LDL Cholesterol: 77 mg/dL (ref 0–99)
NonHDL: 113
Total CHOL/HDL Ratio: 4
Triglycerides: 180 mg/dL — ABNORMAL HIGH (ref 0.0–149.0)
VLDL: 36 mg/dL (ref 0.0–40.0)

## 2020-03-31 LAB — COMPREHENSIVE METABOLIC PANEL
ALT: 15 U/L (ref 0–53)
AST: 14 U/L (ref 0–37)
Albumin: 4.4 g/dL (ref 3.5–5.2)
Alkaline Phosphatase: 78 U/L (ref 39–117)
BUN: 11 mg/dL (ref 6–23)
CO2: 29 mEq/L (ref 19–32)
Calcium: 9.7 mg/dL (ref 8.4–10.5)
Chloride: 101 mEq/L (ref 96–112)
Creatinine, Ser: 0.81 mg/dL (ref 0.40–1.50)
GFR: 86.71 mL/min (ref >60.00)
Glucose, Bld: 135 mg/dL — ABNORMAL HIGH (ref 70–99)
Potassium: 4.2 mEq/L (ref 3.5–5.1)
Sodium: 137 mEq/L (ref 135–145)
Total Bilirubin: 0.9 mg/dL (ref 0.2–1.2)
Total Protein: 7 g/dL (ref 6.0–8.3)

## 2020-03-31 LAB — HM DIABETES FOOT EXAM

## 2020-03-31 LAB — CBC
HCT: 41.5 % (ref 39.0–52.0)
Hemoglobin: 13.9 g/dL (ref 13.0–17.0)
MCHC: 33.4 g/dL (ref 30.0–36.0)
MCV: 95.2 fl (ref 78.0–100.0)
Platelets: 320 10*3/uL (ref 150.0–400.0)
RBC: 4.36 Mil/uL (ref 4.22–5.81)
RDW: 13.1 % (ref 11.5–15.5)
WBC: 10.2 10*3/uL (ref 4.0–10.5)

## 2020-03-31 LAB — HEMOGLOBIN A1C: Hgb A1c MFr Bld: 6.4 % (ref 4.6–6.5)

## 2020-03-31 LAB — PSA, MEDICARE: PSA: 4.46 ng/ml — ABNORMAL HIGH (ref 0.10–4.00)

## 2020-03-31 MED ORDER — ATORVASTATIN CALCIUM 10 MG PO TABS: 10.0000 mg | ORAL_TABLET | ORAL | 3 refills | Status: DC

## 2020-03-31 NOTE — Assessment & Plan Note (Signed)
Seems to have good control still On metformin and glipizide Mild neuropathy--no Rx

## 2020-03-31 NOTE — Assessment & Plan Note (Signed)
Will be getting injection soon

## 2020-03-31 NOTE — Assessment & Plan Note (Signed)
See social history 

## 2020-03-31 NOTE — Progress Notes (Signed)
Subjective:    Patient ID: Frank Vega, male    DOB: 10/13/1945, 75 y.o.   MRN: [id number]  HPI Here for Medicare wellness visit and follow up of chronic health conditions This visit occurred during the SARS-CoV-2 public health emergency.  Safety protocols were in place, including screening questions prior to the visit, additional usage of staff PPE, and extensive cleaning of exam room while observing appropriate contact time as indicated for disinfecting solutions.   Reviewed advanced directives Reviewed other doctors----Dr Dingledein---ophthal, Dr Anne Ng, Dr Ardath Sax, Dr Evorn Gong --derm, Dr Jessee Avers No surgery or hospitalizations in the past year No alcohol No tobacco Vision is okay---has follow up with Dr D soon Hearing aides help Slipped once on water in past year--no sig injury No sig exercise--discussed Independent with instrumental ADLs No sig memory issues  Ongoing problems with his back Will be getting epidural injection soon Does okay other than if he has extended walking (like in a big store) ?slight weakness---like heading up steep concrete steps  "Very stressed right now" Worried about doing probate for cousin who died She had been very private about finances--so very hard Some anxiety but not depressed Not anhedonic though--good with doing other things  Checks sugars regularly--- 131 last night 106 this morning No hypoglycemic spells Feet and legs have less tingling--but does note itching (with anxiety). No foot pain  No chest pain No SOB No palpitations No dizziness or syncope Slight edema--end of day. Better in AM No headaches  Still with limitations in movement of right shoulder No other major joint problems Occasional back stiffness--when just getting up  Current Outpatient Medications on File Prior to Visit  Medication Sig Dispense Refill  . amLODipine (NORVASC) 5 MG tablet TAKE 1 TABLET BY MOUTH EVERY DAY 90 tablet 3   . amoxicillin (AMOXIL) 500 MG capsule Take all 4 tablets by mouth 1 hour before dental procedure 4 capsule 2  . atorvastatin (LIPITOR) 10 MG tablet Take 1 tablet (10 mg total) by mouth once a week. 13 tablet 3  . diphenoxylate-atropine (LOMOTIL) 2.5-0.025 MG tablet TAKE 1 TABLET BY MOUTH DAILY AS NEEDED FOR DIARRHEA OR LOOSE STOOLS 30 tablet 0  . glipiZIDE (GLUCOTROL XL) 5 MG 24 hr tablet TAKE 1 TABLET BY MOUTH EVERY DAY WITH BREAKFAST 90 tablet 3  . glucose blood (ONETOUCH ULTRA) test strip Use to check blood sugar once a day. Dx Code E11.42 100 each 4  . lisinopril-hydrochlorothiazide (ZESTORETIC) 20-12.5 MG tablet TAKE 2 TABLETS BY MOUTH EVERY DAY 180 tablet 3  . metFORMIN (GLUCOPHAGE-XR) 750 MG 24 hr tablet TAKE 1 TABLET BY MOUTH DAILY WITH SUPPER 90 tablet 3  . mometasone (ELOCON) 0.1 % ointment Apply topically 2 (two) times daily.    . Multiple Vitamins-Minerals (MULTIVITAMIN MEN PO) Take by mouth.    Glory Rosebush Delica Lancets 78G MISC 1 each by Does not apply route daily. Dx Code E11.42 100 each 3  . triamcinolone (KENALOG) 0.1 % Apply 1 application topically 2 (two) times daily as needed. 45 g 1   No current facility-administered medications on file prior to visit.    Allergies  Allergen Reactions  . Paxil [Paroxetine Hcl]     Weight loss and jittery  . Shellfish Allergy Swelling  . Latex Rash    Past Medical History:  Diagnosis Date  . 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 Diabetes mellitus with polyneuropathy ([place])   . GERD (gastroesophageal reflux disease)   . Hypertension   .  IBS (irritable bowel syndrome)   . Osteoarthritis of multiple joints     Past Surgical History:  Procedure Laterality Date  . COLONOSCOPY WITH PROPOFOL N/A 07/18/2017   Procedure: COLONOSCOPY WITH PROPOFOL;  Surgeon: Lollie Sails, MD;  Location: [facility] ENDOSCOPY;  Service: Endoscopy;  Laterality: N/A;  . CYST EXCISION  1952   left leg  . KNEE ARTHROSCOPY Left 2011  .  REPLACEMENT TOTAL KNEE Left   . TOTAL HIP ARTHROPLASTY Right     Family History  Problem Relation Age of Onset  . Arthritis Mother   . Hypertension Mother   . Dementia Mother   . Heart attack Father   . Heart disease Father   . Cancer Father        lymphoma    Social History   Socioeconomic History  . Marital status: Married    Spouse name: Not on file  . Number of children: 1  . Years of education: Not on file  . Highest education level: Not on file  Occupational History  . Occupation: Electrical engineer    Comment: Retired  Tobacco Use  . Smoking status: Never Smoker  . Smokeless tobacco: Never Used  Vaping Use  . Vaping Use: Never used  Substance and Sexual Activity  . Alcohol use: No  . Drug use: No  . Sexual activity: Never  Other Topics Concern  . Not on file  Social History Narrative   1 daughter--local      No living will   Wife, then daughter, should make health care decisions   Would accept resuscitation   Not sure about tube feeds   Social Determinants of Health   Financial Resource Strain: Low Risk   . Difficulty of Paying Living Expenses: Not very hard  Food Insecurity: Not on file  Transportation Needs: Not on file  Physical Activity: Not on file  Stress: Not on file  Social Connections: Not on file  Intimate Partner Violence: Not on file   Review of Systems Appetite is good Weight down slightly Wears seat belt Sleep is still not good---things on his mind. Not really tired (takes occ nap) Wears seat belt Full denture on top, partial on bottom Has spot on back the Dr Evorn Gong will treat (uses ketoconazole--doesn't resolve) Voids okay---flow is okay. Nocturia x 1 Bowels always fine. No blood He requests PSA---knows someone with recent cancer Rare heartburn--like after chili beans. Tums will help. Occasional dysphagia--like dry chicken    Objective:   Physical Exam Constitutional:      Appearance: Normal appearance.  HENT:      Mouth/Throat:     Comments: No lesions Eyes:     Conjunctiva/sclera: Conjunctivae normal.     Pupils: Pupils are equal, round, and reactive to light.  Cardiovascular:     Rate and Rhythm: Normal rate and regular rhythm.     Pulses: Normal pulses.     Heart sounds: No murmur heard. No gallop.   Pulmonary:     Effort: Pulmonary effort is normal.     Breath sounds: Normal breath sounds. No wheezing or rales.  Abdominal:     Palpations: Abdomen is soft.     Tenderness: There is no abdominal tenderness.  Musculoskeletal:     Cervical back: Neck supple.     Right lower leg: No edema.     Left lower leg: No edema.  Lymphadenopathy:     Cervical: No cervical adenopathy.  Skin:    General: Skin is warm.  Comments: No foot lesions Rash on left posterior shoulder and lower back--not infection (he will try cortisone cream)  Neurological:     Mental Status: He is alert and oriented to person, place, and time.     Comments: President--- "Waymond Cera, Obama" [phone] [id number] Recall 3/3  Fairly normal sensation in feet  Psychiatric:        Mood and Affect: Mood normal.        Behavior: Behavior normal.            Assessment & Plan:

## 2020-03-31 NOTE — Assessment & Plan Note (Signed)
I have personally reviewed the Medicare Annual Wellness questionnaire and have noted 1. The patient's medical and social history 2. Their use of alcohol, tobacco or illicit drugs 3. Their current medications and supplements 4. The patient's functional ability including ADL's, fall risks, home safety risks and hearing or visual             impairment. 5. Diet and physical activities 6. Evidence for depression or mood disorders  The patients weight, height, BMI and visual acuity have been recorded in the chart I have made referrals, counseling and provided education to the patient based review of the above and I have provided the pt with a written personalized care plan for preventive services.  I have provided you with a copy of your personalized plan for preventive services. Please take the time to review along with your updated medication list.  He hasn't had a PSA in a long time---will do one last one to confirm no issues Colon due again in 1-3 years Jefm Bryant) Discussed exercise Td if any injury Going for COVID booster Had flu vaccine

## 2020-03-31 NOTE — Assessment & Plan Note (Signed)
Mostly anxiety and situational stress No depression No meds for now

## 2020-03-31 NOTE — Assessment & Plan Note (Signed)
BP Readings from Last 3 Encounters:  03/31/20 108/64  12/11/19 124/64  11/23/19 125/65   Good control on amlodipine and lisinopril/HCTZ Is on statin

## 2020-03-31 NOTE — Progress Notes (Signed)
Hearing Screening   125Hz  250Hz  500Hz  1000Hz  2000Hz  3000Hz  4000Hz  6000Hz  8000Hz   Right ear:           Left ear:           Comments: Has hearing aids. Wearing them today.  Vision Screening Comments: Has appointment in February

## 2020-04-01 ENCOUNTER — Other Ambulatory Visit: Payer: Self-pay | Admitting: Internal Medicine

## 2020-04-01 DIAGNOSIS — R972 Elevated prostate specific antigen [PSA]: Secondary | ICD-10-CM

## 2020-04-10 ENCOUNTER — Telehealth: Payer: Self-pay

## 2020-04-10 NOTE — Chronic Care Management (AMB) (Addendum)
Chronic Care Management Pharmacy Assistant   Name: Frank Vega  MRN: [id number] DOB: 09-Jun-1945  Reason for Encounter: CCM follow up appointment reminder   Patient Questions:  1.  Have you seen any other providers since your last visit? Yes 03/31/20- Dr. Silvio Pate- PCP 02/25/20- Dr. Carleene Mains- Orthopedics 01/24/20- Dr. Girtha Hake- Pain management 01/15/20- Dr. Girtha Hake- Pain management 01/01/20- Dr. Girtha Hake- Pain management- Procedure 12/11/19- Dr. Silvio Pate- PCP 11/23/19- Dr. Billey Chang- Family medicine- Video visit  PCP : Venia Carbon, MD  Allergies:   Allergies  Allergen Reactions   Paxil [Paroxetine Hcl]     Weight loss and jittery   Shellfish Allergy Swelling   Latex Rash    Medications: Outpatient Encounter Medications as of 04/10/2020  Medication Sig   amLODipine (NORVASC) 5 MG tablet TAKE 1 TABLET BY MOUTH EVERY DAY   amoxicillin (AMOXIL) 500 MG capsule Take all 4 tablets by mouth 1 hour before dental procedure   atorvastatin (LIPITOR) 10 MG tablet Take 1 tablet (10 mg total) by mouth once a week.   diphenoxylate-atropine (LOMOTIL) 2.5-0.025 MG tablet TAKE 1 TABLET BY MOUTH DAILY AS NEEDED FOR DIARRHEA OR LOOSE STOOLS   glipiZIDE (GLUCOTROL XL) 5 MG 24 hr tablet TAKE 1 TABLET BY MOUTH EVERY DAY WITH BREAKFAST   glucose blood (ONETOUCH ULTRA) test strip Use to check blood sugar once a day. Dx Code E11.42   lisinopril-hydrochlorothiazide (ZESTORETIC) 20-12.5 MG tablet TAKE 2 TABLETS BY MOUTH EVERY DAY   metFORMIN (GLUCOPHAGE-XR) 750 MG 24 hr tablet TAKE 1 TABLET BY MOUTH DAILY WITH SUPPER   mometasone (ELOCON) 0.1 % ointment Apply topically 2 (two) times daily.   Multiple Vitamins-Minerals (MULTIVITAMIN MEN PO) Take by mouth.   OneTouch Delica Lancets 96V MISC 1 each by Does not apply route daily. Dx Code E11.42   triamcinolone (KENALOG) 0.1 % Apply 1 application topically 2 (two) times daily as needed.   No facility-administered  encounter medications on file as of 04/10/2020.    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 D to review any health or medication changes. Patient denied changes to health or medications.   Are you having any problems with your medications? Not at this time  Any concerns he would like to discuss with the pharmacist? Not at this time.  Due to a death in the family patient is unable to keep his appointment 04/14/20 at 9:00 AM with Sharyn Lull. Patient will call back when he can schedule follow up.     Follow-Up:  Pharmacist Review  Debbora Dus, CPP notified  Margaretmary Dys, [place] Assistant [phone]   I have reviewed the care management and care coordination activities outlined in this encounter and I am certifying that I agree with the content of this note. No further action required.  Debbora Dus,  PharmD Clinical Pharmacist [facility] at [facility] [phone]

## 2020-04-14 ENCOUNTER — Telehealth: Payer: Medicare Other

## 2020-04-22 DIAGNOSIS — E119 Type 2 diabetes mellitus without complications: Secondary | ICD-10-CM | POA: Diagnosis not present

## 2020-04-22 LAB — HM DIABETES EYE EXAM

## 2020-04-23 NOTE — Chronic Care Management (AMB) (Addendum)
Contacted patient to reschedule cancelled appointment from 04/14/20 with Sharyn Lull for CCM follow up. Appointment was rescheduled to 05/27/20 at 9:30 AM. Patient aware this will be a phone appointment and he will need to have medications, any blood pressure readings and blood sugar readings available to review with Sharyn Lull.  Follow-Up:  Pharmacist Review and Scheduled Follow-Up With Clinical Pharmacist  Debbora Dus, CPP notified  Margaretmary Dys, [place] Pharmacy Assistant [phone]

## 2020-05-27 ENCOUNTER — Ambulatory Visit (INDEPENDENT_AMBULATORY_CARE_PROVIDER_SITE_OTHER): Payer: Medicare Other

## 2020-05-27 DIAGNOSIS — E1142 Type 2 diabetes mellitus with diabetic polyneuropathy: Secondary | ICD-10-CM | POA: Diagnosis not present

## 2020-05-27 DIAGNOSIS — I10 Essential (primary) hypertension: Secondary | ICD-10-CM

## 2020-05-27 NOTE — Patient Instructions (Signed)
Dear Frank Vega,  Below is a summary of the goals made in preparation for our follow up appointment on May 27, 2020. We will contact you to discuss in the next 45 days. Please contact me anytime with questions or concerns.   Visit Information  Patient Care Plan: CCM Pharmacy Care Plan    Problem Identified: CHL AMB "PATIENT-SPECIFIC PROBLEM"     Long-Range Goal: Disease Management   Start Date: 05/27/2020  Priority: High  Note:    Current Barriers:  . None identified  Pharmacist Clinical Goal(s):  Marland Kitchen Patient will contact provider office for questions/concerns as evidenced notation of same in electronic health record through collaboration with PharmD and provider.   Interventions: . 1:1 collaboration with Venia Carbon, MD regarding development and update of comprehensive plan of care as evidenced by provider attestation and co-signature . Inter-disciplinary care team collaboration (see longitudinal plan of care) . Comprehensive medication review performed; medication list updated in electronic medical record  Hypertension (BP goal <140/90) -Controlled - clinic readings within goal  -Current treatment:  Amlodipine 5 mg - 1 tablet daily (AM)  Lisinopril-HCTZ 20-12.5 mg - 2 tablets daily (AM) -Medications previously tried: none -Current home readings: unable to assess -Assessed refill history - amlodipine possibly past due, last filled 02/19/20 90 DS. Assess at follow up call.   Hyperlipidemia: (LDL goal < 100) -Controlled - LDL 77 -Current treatment:  Atorvastatin 10 mg - 1 tablet weekly (Sundays)  Aspirin 81 mg - 1 tablet daily for CV prevention -started statin on 07/15/19 -Medications previously tried: none  -Assessed refill history - no gaps in adherence to statin   Diabetes (A1c goal <7%) -Controlled - A1c 6.4% -Current medications:  Glipizide 5 mg - 1 tablet daily with breakfast    Metformin 750 mg ER - 1 tablet daily with supper  -Medications  previously tried: none  -Current home glucose readings - checks once a week fasting . fasting glucose: unable to review today Per last visit - diet and exercise below  Diet: chicken or salmon with green beans and tomatoes; avoids pork/bbq, fried meats; eats a lot of sweets but trying to eat fruit or yogurt instead; drinks: Twist (sugar free), water   Exercise: difficult with leg pain, spends 1-2 hours doing yard work weekly, lots of house chores, stretching frequently -Assessed refill history - glipizide timely, metformin possibly past due, last filled 12/15.21 90 DS  Patient Goals/Self-Care Activities . Patient will:  - check glucose 1-2 days per week before breakfast and with any symptoms of hypoglycemia, document, and provide at future appointments  Follow Up Plan: The care management team will reach out to the patient again over the next 45 days.      Debbora Dus, PharmD Clinical Pharmacist [facility] at [facility] [phone]

## 2020-05-27 NOTE — Progress Notes (Addendum)
Chronic Care Management Pharmacy Note  05/27/2020 Name:  Frank Vega MRN:  [id number] DOB:  Mar 18, 1945  Subjective: Frank Vega is an 75 y.o. year old male who is a primary patient of Letvak, Theophilus Kinds, MD.  The CCM team was consulted for assistance with disease management and care coordination needs.    Engaged with patient by telephone for follow up visit in response to provider referral for pharmacy case management and/or care coordination services. Patient unable to complete visit today. Patient answered telephone but was unable to talk. Completed chart review.  Consent to Services:  The patient was given information about Chronic Care Management services, agreed to services, and gave verbal consent prior to initiation of services.  Please see initial visit note for detailed documentation.   Patient Care Team: Venia Carbon, MD as PCP - General (Internal Medicine) Debbora Dus, [facility] as Pharmacist (Pharmacist)  Recent office visits: 03/31/20- Dr. Silvio Pate- PCP 12/11/19- Dr. Silvio Pate- PCP  Recent consult visits: 02/25/20- Dr. Carleene Mains- Orthopedics 01/24/20- Dr. Girtha Hake- Pain management 01/15/20- Dr. Girtha Hake- Pain management 01/01/20- Dr. Girtha Hake- Pain management- Procedure 11/23/19- Dr. Billey Chang- Family medicine- Video visit  Hospital visits: None in previous 6 months  Objective:  Lab Results  Component Value Date   CREATININE 0.81 03/31/2020   BUN 11 03/31/2020   GFR 86.71 03/31/2020   NA 137 03/31/2020   K 4.2 03/31/2020   CALCIUM 9.7 03/31/2020   CO2 29 03/31/2020   GLUCOSE 135 (H) 03/31/2020    Lab Results  Component Value Date/Time   HGBA1C 6.4 03/31/2020 10:37 AM   HGBA1C 6.9 (A) 12/11/2019 10:06 AM   HGBA1C 7.3 (H) 10/19/2018 10:47 AM   GFR 86.71 03/31/2020 10:37 AM   GFR 85.94 10/19/2018 10:47 AM    Last diabetic Eye exam:  Lab Results  Component Value Date/Time   HMDIABEYEEXA No Retinopathy 12/31/2018  12:00 AM    Last diabetic Foot exam:  Lab Results  Component Value Date/Time   HMDIABFOOTEX done 03/31/2020 12:00 AM     Lab Results  Component Value Date   CHOL 157 03/31/2020   HDL 44.30 03/31/2020   LDLCALC 77 03/31/2020   LDLDIRECT 94.0 10/19/2018   TRIG 180.0 (H) 03/31/2020   CHOLHDL 4 03/31/2020    Hepatic Function Latest Ref Rng & Units 03/31/2020 10/19/2018 04/18/2017  Total Protein 6.0 - 8.3 g/dL 7.0 7.1 7.1  Albumin 3.5 - 5.2 g/dL 4.4 4.3 4.2  AST 0 - 37 U/L _0 ALT 0 - 53 U/L _1 Alk Phosphatase 39 - 117 U/L 78 66 60  Total Bilirubin 0.2 - 1.2 mg/dL 0.9 0.8 0.8    No results found for: TSH, FREET4  CBC Latest Ref Rng & Units 03/31/2020 10/19/2018 04/18/2017  WBC 4.0 - 10.5 K/uL 10.2 10.0 9.1  Hemoglobin 13.0 - 17.0 g/dL 13.9 13.8 13.9  Hematocrit 39.0 - 52.0 % 41.5 41.4 41.4  Platelets 150.0 - 400.0 K/uL 320.0 269.0 288.0    No results found for: VD25OH  Clinical ASCVD: No  The 10-year ASCVD risk score Mikey Bussing DC Jr., et al., 2013) is: 34.5%   Values used to calculate the score:     Age: 80 years     Sex: Male     Is Non-Hispanic African American: No     Diabetic: Yes     Tobacco smoker: No     Systolic Blood Pressure: 903 mmHg  Is BP treated: Yes     HDL Cholesterol: 44.3 mg/dL     Total Cholesterol: 157 mg/dL    Depression screen [facility] 2/9 03/31/2020 11/08/2017  Decreased Interest 0 0  Down, Depressed, Hopeless 0 1  PHQ - 2 Score 0 1    Social History   Tobacco Use  Smoking Status Never Smoker  Smokeless Tobacco Never Used   BP Readings from Last 3 Encounters:  03/31/20 108/64  12/11/19 124/64  11/23/19 125/65   Pulse Readings from Last 3 Encounters:  03/31/20 70  12/11/19 81  11/23/19 94   Wt Readings from Last 3 Encounters:  03/31/20 212 lb (96.2 kg)  12/11/19 224 lb (101.6 kg)  06/21/19 222 lb 8 oz (100.9 kg)   BMI Readings from Last 3 Encounters:  03/31/20 31.77 kg/m  12/11/19 33.08 kg/m  06/21/19 32.86 kg/m     Assessment/Interventions: Review of patient past medical history, allergies, medications, health status, including review of consultants reports, laboratory and other test data, was performed as part of comprehensive evaluation and provision of chronic care management services.   SDOH:  (Social Determinants of Health) assessments and interventions performed: No   CCM Care Plan  Allergies  Allergen Reactions   Paxil [Paroxetine Hcl]     Weight loss and jittery   Shellfish Allergy Swelling   Latex Rash    Medications Reviewed Today    Reviewed by Venia Carbon, MD (Physician) on 03/31/20 at 1000  Med List Status: <None>  Medication Order Taking? Sig Documenting Provider Last Dose Status Informant  amLODipine (NORVASC) 5 MG tablet [id number] Yes TAKE 1 TABLET BY MOUTH EVERY DAY Venia Carbon, MD Taking Active   amoxicillin (AMOXIL) 500 MG capsule [id number] Yes Take all 4 tablets by mouth 1 hour before dental procedure Venia Carbon, MD Taking Active   atorvastatin (LIPITOR) 10 MG tablet [id number] Yes Take 1 tablet (10 mg total) by mouth once a week. Venia Carbon, MD Taking Active   diphenoxylate-atropine (LOMOTIL) 2.5-0.025 MG tablet [id number] Yes TAKE 1 TABLET BY MOUTH DAILY AS NEEDED FOR DIARRHEA OR LOOSE STOOLS Venia Carbon, MD Taking Active   glipiZIDE (GLUCOTROL XL) 5 MG 24 hr tablet [id number] Yes TAKE 1 TABLET BY MOUTH EVERY DAY WITH BREAKFAST Venia Carbon, MD Taking Active   glucose blood (ONETOUCH ULTRA) test strip [id number] Yes Use to check blood sugar once a day. Dx Code F09.32 Venia Carbon, MD Taking Active   lisinopril-hydrochlorothiazide (ZESTORETIC) 20-12.5 MG tablet [id number] Yes TAKE 2 TABLETS BY MOUTH EVERY DAY Venia Carbon, MD Taking Active   metFORMIN (GLUCOPHAGE-XR) 750 MG 24 hr tablet [id number] Yes TAKE 1 TABLET BY MOUTH DAILY WITH SUPPER Venia Carbon, MD Taking Active   mometasone (ELOCON) 0.1 % ointment [id number] Yes  Apply topically 2 (two) times daily. [provider] Taking Active   Multiple Vitamins-Minerals (MULTIVITAMIN MEN PO) [id number] Yes Take by mouth. [provider] Taking Active   OneTouch Delica Lancets 26R MISC [id number] Yes 1 each by Does not apply route daily. Dx Code J00.93 Venia Carbon, MD Taking Active   triamcinolone (KENALOG) 0.1 % [id number] Yes Apply 1 application topically 2 (two) times daily as needed. 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[place])    IBS (irritable bowel syndrome)     Immunization History  Administered Date(s) Administered   Fluad Quad(high Dose 65+) 12/11/2019   Influenza,inj,Quad PF,6+ Mos 11/08/2017   Influenza-Unspecified 12/30/2014, 02/09/2017   PFIZER(Purple Top)SARS-COV-2 Vaccination 04/19/2019, 05/10/2019   Pneumococcal Conjugate-13 11/08/2017   Pneumococcal Polysaccharide-23 05/13/2014    Conditions to be addressed/monitored:  Hypertension, Hyperlipidemia and Diabetes  Care Plan : [place]  Updates made by Debbora Dus, [place] since 05/27/2020 12:00 AM    Problem: CHL AMB "PATIENT-SPECIFIC PROBLEM"     Long-Range Goal: Disease Management   Start Date: 05/27/2020  Priority: High  Note:    Current Barriers:   None identified  Pharmacist Clinical Goal(s):   Patient will contact provider office for  questions/concerns as evidenced notation of same in electronic health record through collaboration with PharmD and provider.   Interventions:  1:1 collaboration with Venia Carbon, MD regarding development and update of comprehensive plan of care as evidenced by provider attestation and co-signature  Inter-disciplinary care team collaboration (see longitudinal plan of care)  Comprehensive medication review performed; medication list updated in electronic medical record  Hypertension (BP goal <140/90) -Controlled - clinic readings within goal  -Current treatment:  Amlodipine 5 mg - 1 tablet daily (AM)  Lisinopril-HCTZ 20-12.5 mg - 2 tablets daily (AM) -Medications previously tried: none -Current home readings: unable to assess -Assessed refill history - amlodipine possibly past due, last filled 02/19/20 90 DS. Assess at follow up call.   Hyperlipidemia: (LDL goal < 100) -Controlled - LDL 77 -Current treatment:  Atorvastatin 10 mg - 1 tablet weekly (Sundays)  Aspirin 81 mg - 1 tablet daily for CV prevention -started statin on 07/15/19 -Medications previously tried: none  -Assessed refill history - no gaps in adherence to statin   Diabetes (A1c goal <7%) -Controlled - A1c 6.4% -Current medications:  Glipizide 5 mg - 1 tablet daily with breakfast    Metformin 750 mg ER - 1 tablet daily with supper  -Medications previously tried: none  -Current home glucose readings - checks once a week fasting  fasting glucose: unable to review today Per last visit - diet and exercise below  Diet: chicken or salmon with green beans and tomatoes; avoids pork/bbq, fried meats; eats a lot of sweets but trying to eat fruit or yogurt instead; drinks: Twist (sugar free), water   Exercise: difficult with leg pain, spends 1-2 hours doing yard work weekly, lots of house chores, stretching frequently -Assessed refill history - glipizide timely, metformin possibly past due, last filled 12/15.21 90  DS  Patient Goals/Self-Care Activities  Patient will:  - check glucose 1-2 days per week before breakfast and with any symptoms of hypoglycemia, document, and provide at future appointments  Follow Up Plan: The care management team will reach out to the patient again over the next 45 days.      Patient's preferred pharmacy is:  [id number] [address], [address] [place] [address] Phone: [phone] Fax: [phone]  Care Plan  and Follow Up Patient Decision:  Patient agrees to Care Plan and Follow-up.  Debbora Dus, PharmD Clinical Pharmacist [facility] [facility] at [facility] [phone]     Encounter details: CCM Time Spent      Value Time User   Time spent with patient (minutes)  5 05/27/2020  9:43 AM Debbora Dus, [facility]   Time spent performing Chart review  27 05/27/2020  9:43 AM Debbora Dus, [facility]   Total time (minutes)  32 05/27/2020  9:43 AM Debbora Dus, RPH     Moderate to High Complex Decision Making      Value Time User   Moderate to High complex decision making  No 05/27/2020  9:43 AM Debbora Dus, [facility]     CCM Services: Complex  Prior to outreach and patient consent for Chronic Care Management, I referred this patient for services after reviewing the nominated patient list or from a personal encounter with the patient.  I have personally reviewed this encounter including the documentation in this note and have collaborated with the care management provider regarding care management and care coordination activities to include development and update of the comprehensive care plan. I am certifying that I agree with the content of this note and encounter as supervising physician.

## 2020-05-28 ENCOUNTER — Telehealth: Payer: Self-pay

## 2020-05-28 NOTE — Chronic Care Management (AMB) (Addendum)
Chronic Care Management Pharmacy Assistant   Name: Frank Vega  MRN: [id number] DOB: [birth year]  Reason for Encounter: Disease State and Medication Review- Diabetes and Medication Adherence   Conditions to be addressed/monitored: DMII  Recent office visits:  03/31/20- Dr. Silvio Pate- PCP 12/11/19- Dr. Silvio Pate- PCP  Recent consult visits:  02/25/20- Dr. Carleene Mains- Orthopedics 01/24/20- Dr. Girtha Hake- Pain management 01/15/20- Dr. Girtha Hake- Pain management 01/01/20- Dr. Girtha Hake- Pain management- Procedure 11/23/19- Dr. Billey Chang- Family medicine- Video visit  Hospital visits:  None in previous 6 months  Medications: Outpatient Encounter Medications as of 05/28/2020  Medication Sig   amLODipine (NORVASC) 5 MG tablet TAKE 1 TABLET BY MOUTH EVERY DAY   amoxicillin (AMOXIL) 500 MG capsule Take all 4 tablets by mouth 1 hour before dental procedure   atorvastatin (LIPITOR) 10 MG tablet Take 1 tablet (10 mg total) by mouth once a week.   diphenoxylate-atropine (LOMOTIL) 2.5-0.025 MG tablet TAKE 1 TABLET BY MOUTH DAILY AS NEEDED FOR DIARRHEA OR LOOSE STOOLS   glipiZIDE (GLUCOTROL XL) 5 MG 24 hr tablet TAKE 1 TABLET BY MOUTH EVERY DAY WITH BREAKFAST   glucose blood (ONETOUCH ULTRA) test strip Use to check blood sugar once a day. Dx Code E11.42   lisinopril-hydrochlorothiazide (ZESTORETIC) 20-12.5 MG tablet TAKE 2 TABLETS BY MOUTH EVERY DAY   metFORMIN (GLUCOPHAGE-XR) 750 MG 24 hr tablet TAKE 1 TABLET BY MOUTH DAILY WITH SUPPER   mometasone (ELOCON) 0.1 % ointment Apply topically 2 (two) times daily.   Multiple Vitamins-Minerals (MULTIVITAMIN MEN PO) Take by mouth.   OneTouch Delica Lancets 29H MISC 1 each by Does not apply route daily. Dx Code E11.42   triamcinolone (KENALOG) 0.1 % Apply 1 application topically 2 (two) times daily as needed.   No facility-administered encounter medications on file as of 05/28/2020.     Recent Relevant Labs: Lab Results   Component Value Date/Time   HGBA1C 6.4 03/31/2020 10:37 AM   HGBA1C 6.9 (A) 12/11/2019 10:06 AM   HGBA1C 7.3 (H) 10/19/2018 10:47 AM    Kidney Function Lab Results  Component Value Date/Time   CREATININE 0.81 03/31/2020 10:37 AM   CREATININE 0.87 10/19/2018 10:47 AM   GFR 86.71 03/31/2020 10:37 AM    Multiple attempts made to reach patient. He did not have time to talk 05/28/20, he asked that I call him back on 06/03/20. When I called on 06/03/20 his wife stated he was not home and that he has been very busy and it would be best to call him back in about 1 month. Will follow up with patient early May 2022.  Current antihyperglycemic regimen:  Glipizide 5 mg - 1 tablet daily with breakfast   Metformin 750 mg ER - 1 tablet daily with supper   Have there been any recent hospitalizations or ED visits since last visit with CPP? No   Adherence Review: Is the patient currently on a STATIN medication? Yes Is the patient currently on ACE/ARB medication? Yes Does the patient have >5 day gap between last estimated fill dates? No  Star Rating Drugs: Medication:  Last Fill: Day Supply Atorvastatin 10 mg 03/31/20 84 Glipizide 5 mg  03/14/20  90 Lisinopril-HCTZ 20-12.5 mg 05/26/20  90 metformin 750 mg 05/15/20 90    Follow-Up:  Pharmacist Review  Debbora Dus, CPP notified  Margaretmary Dys, [place] Assistant [phone]  I have reviewed the care management and care coordination activities outlined in this encounter and I am certifying that  I agree with the content of this note. No further action required.  Debbora Dus, PharmD Clinical Pharmacist [facility] at [facility] [phone]

## 2020-06-08 ENCOUNTER — Other Ambulatory Visit: Payer: Self-pay | Admitting: Internal Medicine

## 2020-06-08 NOTE — Telephone Encounter (Signed)
Last filled 03-09-20 #30 Last OV 03-31-20 Next OV 09-25-20 [facility] [address] [facility] [employer]

## 2020-06-12 ENCOUNTER — Other Ambulatory Visit: Payer: Self-pay | Admitting: Internal Medicine

## 2020-06-17 ENCOUNTER — Other Ambulatory Visit: Payer: Medicare Other

## 2020-07-07 DIAGNOSIS — X32XXXA Exposure to sunlight, initial encounter: Secondary | ICD-10-CM | POA: Diagnosis not present

## 2020-07-07 DIAGNOSIS — L820 Inflamed seborrheic keratosis: Secondary | ICD-10-CM | POA: Diagnosis not present

## 2020-07-07 DIAGNOSIS — R202 Paresthesia of skin: Secondary | ICD-10-CM | POA: Diagnosis not present

## 2020-07-07 DIAGNOSIS — Z85828 Personal history of other malignant neoplasm of skin: Secondary | ICD-10-CM | POA: Diagnosis not present

## 2020-07-07 DIAGNOSIS — D2271 Melanocytic nevi of right lower limb, including hip: Secondary | ICD-10-CM | POA: Diagnosis not present

## 2020-07-07 DIAGNOSIS — L570 Actinic keratosis: Secondary | ICD-10-CM | POA: Diagnosis not present

## 2020-07-07 DIAGNOSIS — L538 Other specified erythematous conditions: Secondary | ICD-10-CM | POA: Diagnosis not present

## 2020-07-07 DIAGNOSIS — B360 Pityriasis versicolor: Secondary | ICD-10-CM | POA: Diagnosis not present

## 2020-07-07 DIAGNOSIS — D2261 Melanocytic nevi of right upper limb, including shoulder: Secondary | ICD-10-CM | POA: Diagnosis not present

## 2020-07-08 ENCOUNTER — Ambulatory Visit (INDEPENDENT_AMBULATORY_CARE_PROVIDER_SITE_OTHER): Payer: Medicare Other | Admitting: Internal Medicine

## 2020-07-08 ENCOUNTER — Encounter: Payer: Self-pay | Admitting: Internal Medicine

## 2020-07-08 ENCOUNTER — Other Ambulatory Visit: Payer: Self-pay

## 2020-07-08 VITALS — BP 112/66 | HR 84 | Temp 98.0°F | Ht 69.0 in | Wt 201.0 lb

## 2020-07-08 DIAGNOSIS — F39 Unspecified mood [affective] disorder: Secondary | ICD-10-CM

## 2020-07-08 MED ORDER — ALPRAZOLAM 0.25 MG PO TABS: 0.2500 mg | ORAL_TABLET | Freq: Two times a day (BID) | ORAL | 0 refills | Status: DC | PRN

## 2020-07-08 NOTE — Progress Notes (Signed)
Subjective:    Patient ID: Frank Vega, male    DOB: [birth year], 75 y.o.   MRN: [id number]  HPI Here due to ongoing anxiety This visit occurred during the SARS-CoV-2 public health emergency.  Safety protocols were in place, including screening questions prior to the visit, additional usage of staff PPE, and extensive cleaning of exam room while observing appropriate contact time as indicated for disinfecting solutions.   Having increased stress with managing cousin's estate She was so private----it is hard to find things Doctors not being paid----turned out insurance had put in the wrong date of death so services refused He kept getting notices from the doctors----caused great distress for him  Lots of other frustration Dealing with past taxes also "I just want to get the whole estate done"---but probably will go into next year  Not really panicking ---but stresses out (like got doctors bill on a Friday and couldn't do anything about it till the next week)  No travel or time off Wife urging him to get away--I concur  Not really depressed  Also fell yesterday in laundry room---tripped over a grabber Hurt left foot and shoulder  Current Outpatient Medications on File Prior to Visit  Medication Sig Dispense Refill  . amLODipine (NORVASC) 5 MG tablet TAKE 1 TABLET BY MOUTH EVERY DAY 90 tablet 3  . amoxicillin (AMOXIL) 500 MG capsule Take all 4 tablets by mouth 1 hour before dental procedure 4 capsule 2  . atorvastatin (LIPITOR) 10 MG tablet Take 1 tablet (10 mg total) by mouth once a week. 13 tablet 3  . diphenoxylate-atropine (LOMOTIL) 2.5-0.025 MG tablet TAKE 1 TABLET BY MOUTH DAILY AS NEEDED FOR DIARRHEA OR LOOSE STOOLS 30 tablet 0  . glipiZIDE (GLUCOTROL XL) 5 MG 24 hr tablet TAKE 1 TABLET BY MOUTH EVERY DAY WITH BREAKFAST 90 tablet 3  . glucose blood (ONETOUCH ULTRA) test strip Use to check blood sugar once a day. Dx Code E11.42 100 each 4  . lisinopril-hydrochlorothiazide  (ZESTORETIC) 20-12.5 MG tablet TAKE 2 TABLETS BY MOUTH EVERY DAY 180 tablet 3  . metFORMIN (GLUCOPHAGE-XR) 750 MG 24 hr tablet TAKE 1 TABLET BY MOUTH DAILY WITH SUPPER 90 tablet 3  . mometasone (ELOCON) 0.1 % ointment Apply topically 2 (two) times daily.    . Multiple Vitamins-Minerals (MULTIVITAMIN MEN PO) Take by mouth.    Glory Rosebush Delica Lancets 06C MISC 1 each by Does not apply route daily. Dx Code E11.42 100 each 3  . triamcinolone (KENALOG) 0.1 % Apply 1 application topically 2 (two) times daily as needed. 45 g 1   No current facility-administered medications on file prior to visit.    Allergies  Allergen Reactions  . Paxil [Paroxetine Hcl]     Weight loss and jittery  . Shellfish Allergy Swelling  . Latex Rash    Past Medical History:  Diagnosis Date  . 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 GERD (gastroesophageal reflux disease)   . Hypertension   . IBS (irritable bowel syndrome)   . Osteoarthritis of multiple joints     Past Surgical History:  Procedure Laterality Date  . COLONOSCOPY WITH PROPOFOL N/A 07/18/2017   Procedure: COLONOSCOPY WITH PROPOFOL;  Surgeon: Lollie Sails, MD;  Location: [facility] ENDOSCOPY;  Service: Endoscopy;  Laterality: N/A;  . CYST EXCISION  1952   left leg  . KNEE ARTHROSCOPY Left 2011  . REPLACEMENT TOTAL KNEE Left   .  TOTAL HIP ARTHROPLASTY Right     Family History  Problem Relation Age of Onset  . Arthritis Mother   . Hypertension Mother   . Dementia Mother   . Heart attack Father   . Heart disease Father   . Cancer Father        lymphoma    Social History   Socioeconomic History  . Marital status: Married    Spouse name: Not on file  . Number of children: 1  . Years of education: Not on file  . Highest education level: Not on file  Occupational History  . Occupation: Electrical engineer    Comment: Retired  Tobacco Use  . Smoking status: Never  Smoker  . Smokeless tobacco: Never Used  Vaping Use  . Vaping Use: Never used  Substance and Sexual Activity  . Alcohol use: No  . Drug use: No  . Sexual activity: Never  Other Topics Concern  . Not on file  Social History Narrative   1 daughter--local      No living will   Wife, then daughter, should make health care decisions   Would accept resuscitation   Not sure about tube feeds   Social Determinants of Health   Financial Resource Strain: Not on file  Food Insecurity: Not on file  Transportation Needs: Not on file  Physical Activity: Not on file  Stress: Not on file  Social Connections: Not on file  Intimate Partner Violence: Not on file   Review of Systems Not eating well Has lost 20# over the past few months Usually sleeps okay---not 8 hours---and occasionally up worrying about all this    Objective:   Physical Exam Constitutional:      Appearance: Normal appearance.  Musculoskeletal:     Comments: Fairly normal ROM in left shoulder  Left foot and ankle have normal ROM without tenderness  Skin:    Comments: Small scabbed area on lateral left foot--no infection  Neurological:     Mental Status: He is alert.  Psychiatric:        Mood and Affect: Mood normal.        Behavior: Behavior normal.     Comments: Normal interaction, speech and appearance            Assessment & Plan:

## 2020-07-08 NOTE — Assessment & Plan Note (Addendum)
Ongoing stress --mostly with cousin's estate No MDD still Gets worked up at [employer] has sleep issues  Past lexapro use with more stress in the past (with severe increase in work responsibility while caring for mom) Didn't like how he felt on that   Discussed alternatives---will give Rx for xanax for prn use Also discussed a vacation and having an escape from the stress of the estate

## 2020-07-20 ENCOUNTER — Telehealth: Payer: Self-pay

## 2020-07-20 NOTE — Chronic Care Management (AMB) (Addendum)
Chronic Care Management Pharmacy Assistant   Name: Frank Vega  MRN: [id number] DOB: 1945-04-24  Reason for Encounter: Disease State   Conditions to be addressed/monitored: HTN and DMII  Recent office visits:  07/08/2020  Dr.Letvak PCP- Started Alprazolam 0.25mg  take 1 tablet 2 times a day prn  Recent consult visits:  None since last CCM contact  Hospital visits:  None in previous 6 months  Medications: Outpatient Encounter Medications as of 07/20/2020  Medication Sig   ALPRAZolam (XANAX) 0.25 MG tablet Take 1 tablet (0.25 mg total) by mouth 2 (two) times daily as needed for anxiety.   amLODipine (NORVASC) 5 MG tablet TAKE 1 TABLET BY MOUTH EVERY DAY   amoxicillin (AMOXIL) 500 MG capsule Take all 4 tablets by mouth 1 hour before dental procedure   atorvastatin (LIPITOR) 10 MG tablet Take 1 tablet (10 mg total) by mouth once a week.   diphenoxylate-atropine (LOMOTIL) 2.5-0.025 MG tablet TAKE 1 TABLET BY MOUTH DAILY AS NEEDED FOR DIARRHEA OR LOOSE STOOLS   glipiZIDE (GLUCOTROL XL) 5 MG 24 hr tablet TAKE 1 TABLET BY MOUTH EVERY DAY WITH BREAKFAST   glucose blood (ONETOUCH ULTRA) test strip Use to check blood sugar once a day. Dx Code E11.42   lisinopril-hydrochlorothiazide (ZESTORETIC) 20-12.5 MG tablet TAKE 2 TABLETS BY MOUTH EVERY DAY   metFORMIN (GLUCOPHAGE-XR) 750 MG 24 hr tablet TAKE 1 TABLET BY MOUTH DAILY WITH SUPPER   mometasone (ELOCON) 0.1 % ointment Apply topically 2 (two) times daily.   Multiple Vitamins-Minerals (MULTIVITAMIN MEN PO) Take by mouth.   OneTouch Delica Lancets 75I MISC 1 each by Does not apply route daily. Dx Code E11.42   triamcinolone (KENALOG) 0.1 % Apply 1 application topically 2 (two) times daily as needed.   No facility-administered encounter medications on file as of 07/20/2020.   Recent Relevant Labs: Lab Results  Component Value Date/Time   HGBA1C 6.4 03/31/2020 10:37 AM   HGBA1C 6.9 (A) 12/11/2019 10:06 AM   HGBA1C 7.3 (H) 10/19/2018  10:47 AM    Kidney Function Lab Results  Component Value Date/Time   CREATININE 0.81 03/31/2020 10:37 AM   CREATININE 0.87 10/19/2018 10:47 AM   GFR 86.71 03/31/2020 10:37 AM    Current antihyperglycemic regimen:   Glipizide 5 mg - 1 tablet daily with breakfast    Metformin 750 mg ER - 1 tablet daily with supper   Spoke to the wife Frank Vega as Frank Vega was in the shower and he has missed some phone calls.  What recent interventions/DTPs have been made to improve glycemic control: None identified   Have there been any recent hospitalizations or ED visits since last visit with CPP? No  Patient denies hypoglycemic symptoms, including Pale, Sweaty, Shaky, Hungry, Nervous/irritable and Vision changes  Patient denies hyperglycemic symptoms, including blurry vision, excessive thirst, fatigue, polyuria and weakness  How often are you checking your blood sugar? The wife states he is not checking his BG's. I encourage her to help him with doing this.  On insulin? No  Are you checking your feet daily/regularly? Yes the wife states she  looks out for blisters, cuts, and sores.   Adherence Review: Is the patient currently on a STATIN medication? Yes Is the patient currently on ACE/ARB medication? Yes Does the patient have >5 day gap between last estimated fill dates? No  Star Rating Drugs:  Medication:  Last Fill: Day Supply Atorvastatin 10mg  06/15/2020 84 Glipizide 5mg   06/12/2020 90 Metformin 750mg  05/15/2020 90 Lisinopril 20-12.5mg  05/26/2020  90   Recent Office Vitals: BP Readings from Last 3 Encounters:  07/08/20 112/66  03/31/20 108/64  12/11/19 124/64   Pulse Readings from Last 3 Encounters:  07/08/20 84  03/31/20 70  12/11/19 81    Wt Readings from Last 3 Encounters:  07/08/20 201 lb (91.2 kg)  03/31/20 212 lb (96.2 kg)  12/11/19 224 lb (101.6 kg)     Kidney Function Lab Results  Component Value Date/Time   CREATININE 0.81 03/31/2020 10:37 AM   CREATININE 0.87  10/19/2018 10:47 AM   GFR 86.71 03/31/2020 10:37 AM    BMP Latest Ref Rng & Units 03/31/2020 10/19/2018 04/18/2017  Glucose 70 - 99 mg/dL 135(H) 142(H) 96  BUN 6 - 23 mg/dL 11 17 12   Creatinine 0.40 - 1.50 mg/dL 0.81 0.87 0.71  Sodium 135 - 145 mEq/L 137 138 139  Potassium 3.5 - 5.1 mEq/L 4.2 4.4 4.0  Chloride 96 - 112 mEq/L 101 101 102  CO2 19 - 32 mEq/L 29 30 29   Calcium 8.4 - 10.5 mg/dL 9.7 9.6 9.7   Current antihypertensive regimen:   Amlodipine 5 mg - 1 tablet daily (AM)  Lisinopril-HCTZ 20-12.5 mg - 2 tablets daily (AM)  Confirms adherence to above medications: yes  How often are you checking your Blood Pressure? when feeling symptomatic  Caffeine intake: denies caffeine, only lots of water and sugar free drinks Salt intake: will not add to food OTC medications including pseudoephedrine or NSAIDs?  No  What recent interventions/DTPs have been made by any provider to improve Blood Pressure control since last CPP Visit: None identified Any recent hospitalizations or ED visits since last visit with CPP? No What diet changes have been made to improve Blood Pressure Control? eating green vegetables, lean meat and fish What exercise is being done to improve your Blood Pressure Control? the patient has been busy closing an estate after a cousin passed away and there is something to do every day recently.  Adherence Review: Is the patient currently on ACE/ARB medication? Yes Does the patient have >5 day gap between last estimated fill dates? No  Follow-Up:  Pharmacist Review  Debbora Dus, CPP notified  Avel Sensor, [place] Assistant [phone]  I have reviewed the care management and care coordination activities outlined in this encounter and I am certifying that I agree with the content of this note. No further action required.  Debbora Dus, PharmD Clinical Pharmacist [facility] at [facility] [phone]

## 2020-08-06 DIAGNOSIS — M5417 Radiculopathy, lumbosacral region: Secondary | ICD-10-CM | POA: Diagnosis not present

## 2020-08-06 DIAGNOSIS — M5126 Other intervertebral disc displacement, lumbar region: Secondary | ICD-10-CM | POA: Diagnosis not present

## 2020-08-06 DIAGNOSIS — M5416 Radiculopathy, lumbar region: Secondary | ICD-10-CM | POA: Diagnosis not present

## 2020-08-15 IMAGING — DX LUMBAR SPINE - 2-3 VIEW
3 series · 3 of 3 positions shown · non-contrast
Comparison: MRI 11/10/2009

CLINICAL DATA: Back pain

EXAM:
LUMBAR SPINE - 2-3 VIEW

[l-spine ap]
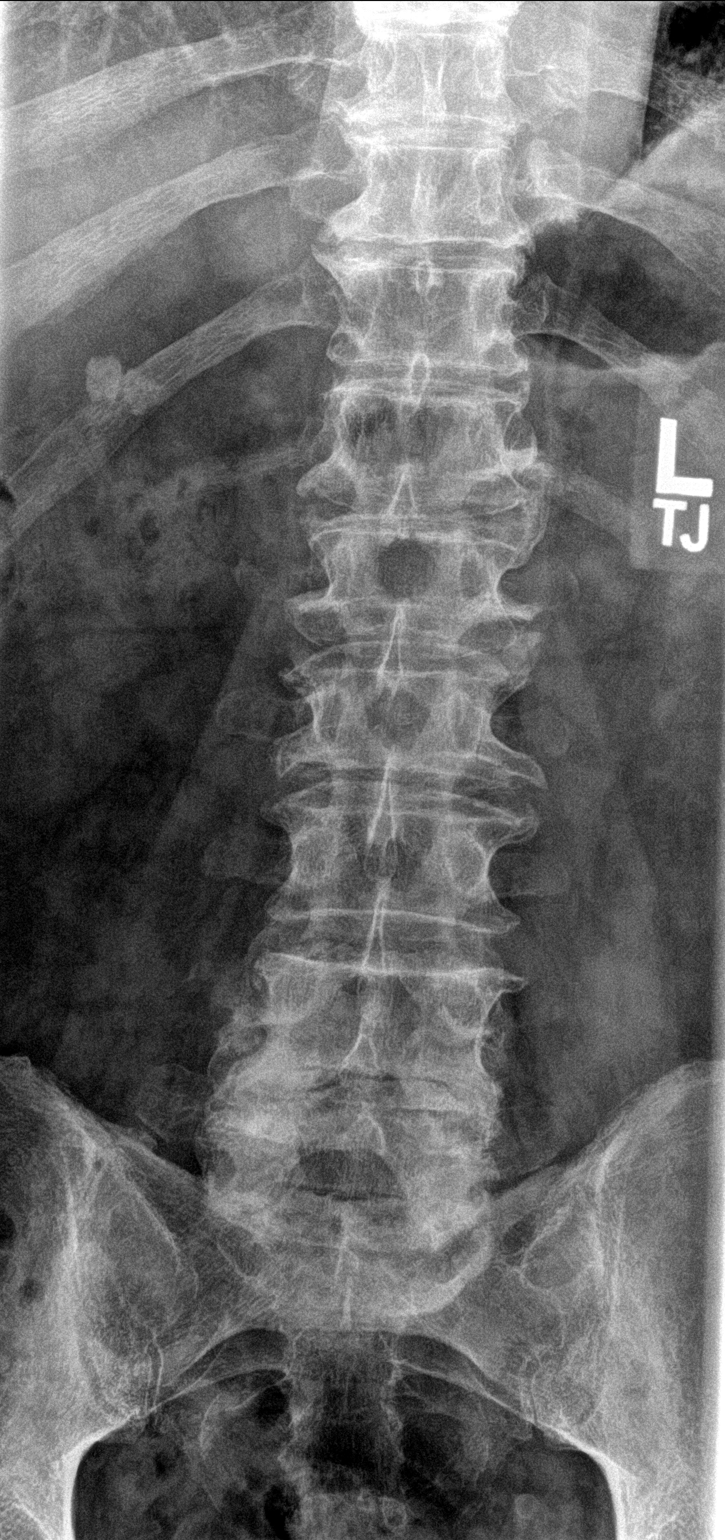

[l-spine lat]
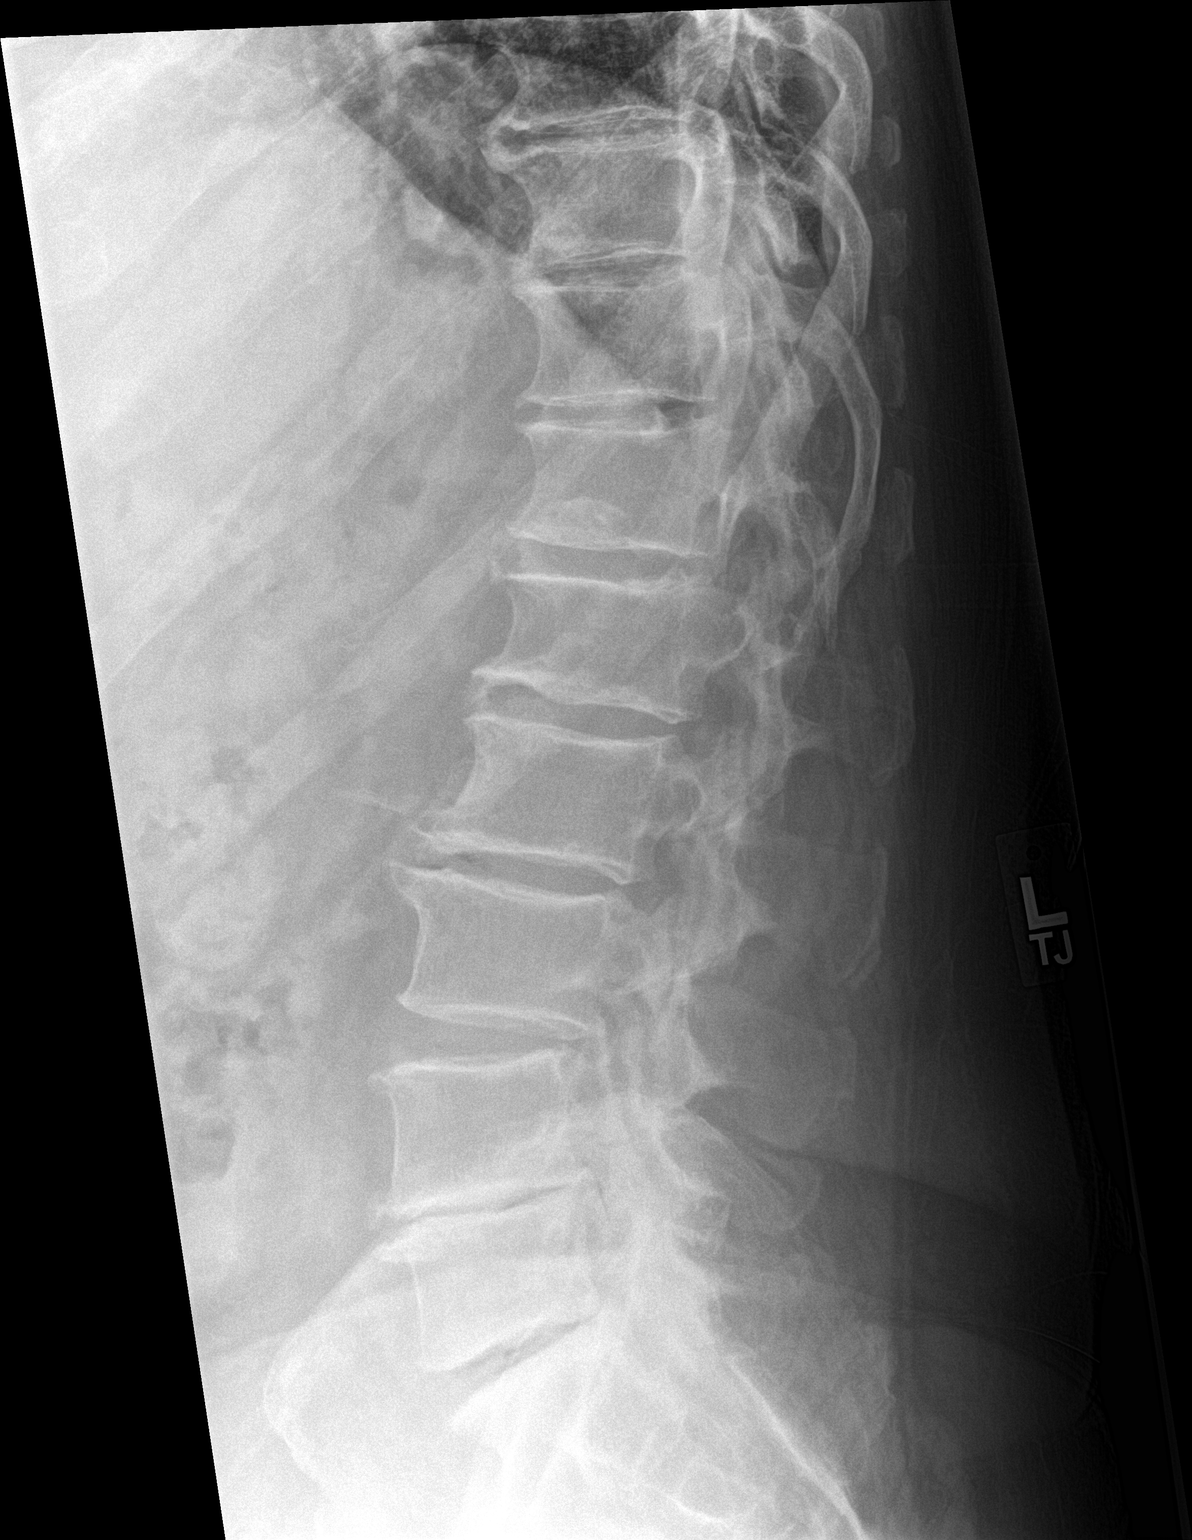

[l-spine l5/s1]
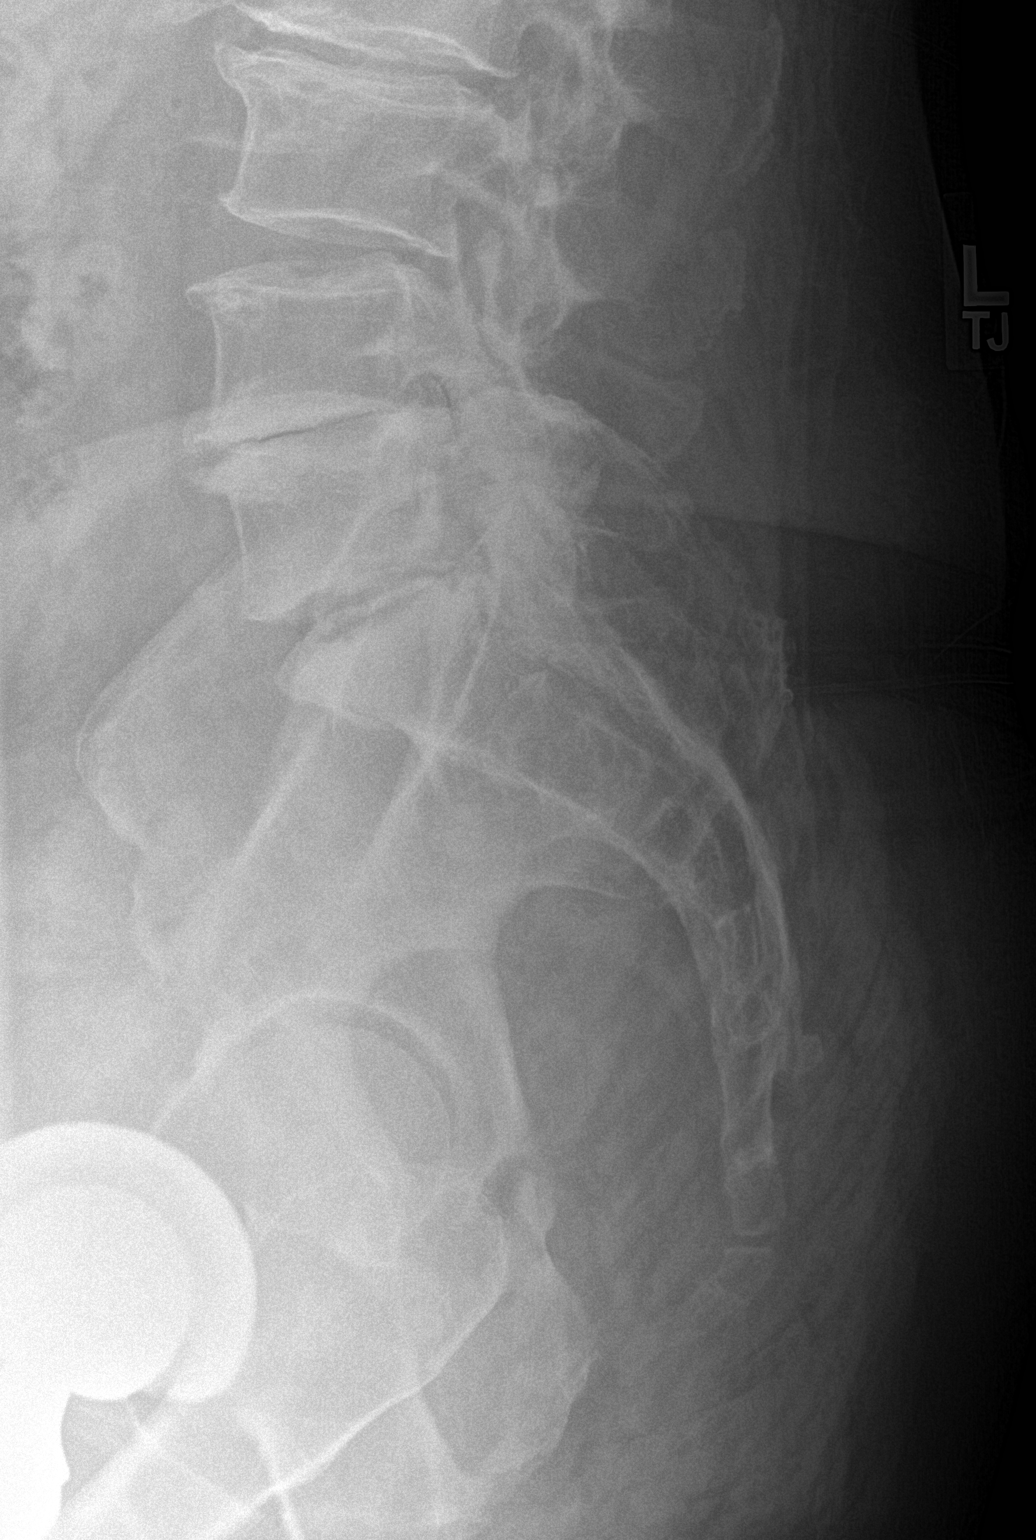

[3 of 3 positions shown; findings below may reference images not displayed]

FINDINGS: Calcifications in the right upper quadrant. Presumed hypoplastic
ribs at T12. Trace retrolisthesis L3 on L4. Mild disc space
narrowing and osteophyte at L1-L2 and L2-L3. Advanced degenerative
changes at L4-L5 and L5-S1 with disc space narrowing, vacuum disc
and osteophyte.
IMPRESSION: 1. No acute osseous abnormality
2. Multiple level degenerative changes, most marked at L4-L5 and
L5-S1
3. Probable gallstones

## 2020-09-25 ENCOUNTER — Ambulatory Visit: Payer: Medicare Other | Admitting: Internal Medicine

## 2020-10-26 ENCOUNTER — Ambulatory Visit (INDEPENDENT_AMBULATORY_CARE_PROVIDER_SITE_OTHER): Payer: Medicare Other | Admitting: Internal Medicine

## 2020-10-26 ENCOUNTER — Encounter: Payer: Self-pay | Admitting: Internal Medicine

## 2020-10-26 ENCOUNTER — Other Ambulatory Visit: Payer: Self-pay

## 2020-10-26 ENCOUNTER — Telehealth: Payer: Self-pay

## 2020-10-26 VITALS — BP 122/76 | HR 66 | Temp 97.0°F | Ht 69.0 in | Wt 207.0 lb

## 2020-10-26 DIAGNOSIS — I10 Essential (primary) hypertension: Secondary | ICD-10-CM | POA: Diagnosis not present

## 2020-10-26 DIAGNOSIS — F39 Unspecified mood [affective] disorder: Secondary | ICD-10-CM

## 2020-10-26 DIAGNOSIS — K580 Irritable bowel syndrome with diarrhea: Secondary | ICD-10-CM | POA: Diagnosis not present

## 2020-10-26 DIAGNOSIS — E1142 Type 2 diabetes mellitus with diabetic polyneuropathy: Secondary | ICD-10-CM | POA: Diagnosis not present

## 2020-10-26 LAB — POCT GLYCOSYLATED HEMOGLOBIN (HGB A1C): Hemoglobin A1C: 6.3 % — AB (ref 4.0–5.6)

## 2020-10-26 MED ORDER — TRIAMCINOLONE ACETONIDE 0.1 % EX CREA: 1.0000 "application " | TOPICAL_CREAM | Freq: Two times a day (BID) | CUTANEOUS | 1 refills | Status: DC | PRN

## 2020-10-26 MED ORDER — ONETOUCH ULTRA VI STRP: ORAL_STRIP | 4 refills | Status: AC

## 2020-10-26 MED ORDER — DIPHENOXYLATE-ATROPINE 2.5-0.025 MG PO TABS: 1.0000 | ORAL_TABLET | Freq: Every day | ORAL | 0 refills | Status: DC | PRN

## 2020-10-26 NOTE — Assessment & Plan Note (Signed)
BP Readings from Last 3 Encounters:  10/26/20 122/76  07/08/20 112/66  03/31/20 108/64   Good control on lisinopril HCTZ

## 2020-10-26 NOTE — Assessment & Plan Note (Signed)
Lab Results  Component Value Date   HGBA1C 6.3 (A) 10/26/2020   Good control on glipizide and metformin No low sugar reactions Neuropathy is better

## 2020-10-26 NOTE — Chronic Care Management (AMB) (Addendum)
    Chronic Care Management Pharmacy Assistant   Name: Frank Vega  MRN: [id number] DOB: [birth year]  Reason for Encounter: General Adherence   Recent office visits:  10/26/20 - PCP- Patient presented for follow up diabetes. Labs ordered no medication changes. Follow up 6 months.  Recent consult visits:  08/06/20 - [facility] - Patient presented with Lumbar Radiculopathy for procedure.   Hospital visits:  None in previous 6 months  Medications: Outpatient Encounter Medications as of 10/26/2020  Medication Sig   ALPRAZolam (XANAX) 0.25 MG tablet Take 1 tablet (0.25 mg total) by mouth 2 (two) times daily as needed for anxiety.   amLODipine (NORVASC) 5 MG tablet TAKE 1 TABLET BY MOUTH EVERY DAY   amoxicillin (AMOXIL) 500 MG capsule Take all 4 tablets by mouth 1 hour before dental procedure   atorvastatin (LIPITOR) 10 MG tablet Take 1 tablet (10 mg total) by mouth once a week.   diphenoxylate-atropine (LOMOTIL) 2.5-0.025 MG tablet TAKE 1 TABLET BY MOUTH DAILY AS NEEDED FOR DIARRHEA OR LOOSE STOOLS   glipiZIDE (GLUCOTROL XL) 5 MG 24 hr tablet TAKE 1 TABLET BY MOUTH EVERY DAY WITH BREAKFAST   glucose blood (ONETOUCH ULTRA) test strip Use to check blood sugar once a day. Dx Code E11.42   lisinopril-hydrochlorothiazide (ZESTORETIC) 20-12.5 MG tablet TAKE 2 TABLETS BY MOUTH EVERY DAY   metFORMIN (GLUCOPHAGE-XR) 750 MG 24 hr tablet TAKE 1 TABLET BY MOUTH DAILY WITH SUPPER   mometasone (ELOCON) 0.1 % ointment Apply topically 2 (two) times daily.   Multiple Vitamins-Minerals (MULTIVITAMIN MEN PO) Take by mouth.   OneTouch Delica Lancets 99991111 MISC 1 each by Does not apply route daily. Dx Code E11.42   triamcinolone (KENALOG) 0.1 % Apply 1 application topically 2 (two) times daily as needed.   No facility-administered encounter medications on file as of 10/26/2020.    Attempted contact with [name] 3 times on 10/27/20,10/29/20,11/03/20. Unsuccessful outreach. Will attempt contact next  month.   Patient is not > 5 days past due for refill on the following medications per chart history:  Star Medications: Medication Name/mg Last Fill Days Supply Glipizide '5mg'$    09/09/20  90 Atorvastatin '10mg'$   09/09/20  84 Lisinopril 20-12.'5mg'$   09/03/20 90 Metformin '750mg'$   08/10/20  90  Care Gaps: Last annual wellness visit:03/31/20  No appointments scheduled within the next 30 days.   Debbora Dus, CPP notified  Avel Sensor, [place] Assistant [phone]  I have reviewed the care management and care coordination activities outlined in this encounter and I am certifying that I agree with the content of this note. No further action required.  Debbora Dus, PharmD Clinical Pharmacist [facility] at [facility] [phone]

## 2020-10-26 NOTE — Assessment & Plan Note (Signed)
Working through the stressful estate Hasn't needed the xanax

## 2020-10-26 NOTE — Assessment & Plan Note (Signed)
Uses the lomotil in social situations

## 2020-10-26 NOTE — Progress Notes (Signed)
Subjective:    Patient ID: Frank Vega, male    DOB: 02-09-1946, 75 y.o.   MRN: [id number]  HPI Here for follow up of diabetes This visit occurred during the SARS-CoV-2 public health emergency.  Safety protocols were in place, including screening questions prior to the visit, additional usage of staff PPE, and extensive cleaning of exam room while observing appropriate contact time as indicated for disinfecting solutions.   Diabetes has been okay Checks sugars once or twice a week 106-122 Neuropathy in feet better since starting using [employer]  Ongoing stress with dealing with estate Some other issues as well Starting to "see the light of day"--may get things settled by next February Hired accountant Hasn't taken a xanax--but got close  No chest pain or SOB No dizziness  Current Outpatient Medications on File Prior to Visit  Medication Sig Dispense Refill   ALPRAZolam (XANAX) 0.25 MG tablet Take 1 tablet (0.25 mg total) by mouth 2 (two) times daily as needed for anxiety. 20 tablet 0   amLODipine (NORVASC) 5 MG tablet TAKE 1 TABLET BY MOUTH EVERY DAY 90 tablet 3   amoxicillin (AMOXIL) 500 MG capsule Take all 4 tablets by mouth 1 hour before dental procedure 4 capsule 2   atorvastatin (LIPITOR) 10 MG tablet Take 1 tablet (10 mg total) by mouth once a week. 13 tablet 3   diphenoxylate-atropine (LOMOTIL) 2.5-0.025 MG tablet TAKE 1 TABLET BY MOUTH DAILY AS NEEDED FOR DIARRHEA OR LOOSE STOOLS 30 tablet 0   glipiZIDE (GLUCOTROL XL) 5 MG 24 hr tablet TAKE 1 TABLET BY MOUTH EVERY DAY WITH BREAKFAST 90 tablet 3   glucose blood (ONETOUCH ULTRA) test strip Use to check blood sugar once a day. Dx Code E11.42 100 each 4   lisinopril-hydrochlorothiazide (ZESTORETIC) 20-12.5 MG tablet TAKE 2 TABLETS BY MOUTH EVERY DAY 180 tablet 3   metFORMIN (GLUCOPHAGE-XR) 750 MG 24 hr tablet TAKE 1 TABLET BY MOUTH DAILY WITH SUPPER 90 tablet 3   mometasone (ELOCON) 0.1 % ointment Apply topically 2 (two)  times daily.     Multiple Vitamins-Minerals (MULTIVITAMIN MEN PO) Take by mouth.     OneTouch Delica Lancets 99991111 MISC 1 each by Does not apply route daily. Dx Code E11.42 100 each 3   triamcinolone (KENALOG) 0.1 % Apply 1 application topically 2 (two) times daily as needed. 45 g 1   No current facility-administered medications on file prior to visit.    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[facility])    GERD (gastroesophageal reflux disease)    Hypertension    IBS (irritable bowel syndrome)    Osteoarthritis of multiple joints     Past Surgical History:  Procedure Laterality Date   COLONOSCOPY WITH PROPOFOL N/A 07/18/2017   Procedure: COLONOSCOPY WITH PROPOFOL;  Surgeon: Lollie Sails, MD;  Location: [facility] ENDOSCOPY;  Service: Endoscopy;  Laterality: N/A;   CYST EXCISION  1952   left leg   KNEE ARTHROSCOPY Left 2011   REPLACEMENT TOTAL KNEE Left    TOTAL HIP ARTHROPLASTY Right     Family History  Problem Relation Age of Onset   Arthritis Mother    Hypertension Mother    Dementia Mother    Heart attack Father  Heart disease Father    Cancer Father        lymphoma    Social History   Socioeconomic History   Marital status: Married    Spouse name: Not on file   Number of children: 1   Years of education: Not on file   Highest education level: Not on file  Occupational History   Occupation: Electrical engineer    Comment: Retired  Tobacco Use   Smoking status: Never   Smokeless tobacco: Never  Scientific laboratory technician Use: Never used  Substance and Sexual Activity   Alcohol use: No   Drug use: No   Sexual activity: Never  Other Topics Concern   Not on file  Social History Narrative   1 daughter--local      No living will    Wife, then daughter, should make health care decisions   Would accept resuscitation   Not sure about tube feeds   Social Determinants of Health   Financial Resource Strain: Not on file  Food Insecurity: Not on file  Transportation Needs: Not on file  Physical Activity: Not on file  Stress: Not on file  Social Connections: Not on file  Intimate Partner Violence: Not on file   Review of Systems Will have diarrhea with nerves--goes back to age 56. Uses the lomotil if he is out Appetite is okay Weight is stable    Objective:   Physical Exam Constitutional:      Appearance: Normal appearance.  Cardiovascular:     Rate and Rhythm: Normal rate and regular rhythm.     Pulses: Normal pulses.     Heart sounds: No murmur heard.   No gallop.  Pulmonary:     Effort: Pulmonary effort is normal.     Breath sounds: Normal breath sounds. No wheezing or rales.  Musculoskeletal:     Cervical back: Neck supple.     Right lower leg: No edema.     Left lower leg: No edema.  Lymphadenopathy:     Cervical: No cervical adenopathy.  Skin:    Comments: No foot lesions  Neurological:     Mental Status: He is alert.  Psychiatric:        Mood and Affect: Mood normal.        Behavior: Behavior normal.           Assessment & Plan:

## 2020-11-13 ENCOUNTER — Telehealth: Payer: Self-pay

## 2020-11-13 NOTE — Progress Notes (Addendum)
Chronic Care Management Pharmacy Assistant   Name: Frank Vega  MRN: [id number] DOB: 1945/10/07  Reason for Encounter: General Disease State   Recent office visits:  10/26/2020 - Frank Simpler, MD - Patient presented for 6 month f/u for Diabetes. Labs: A1c. Follow-up 6 months.   Recent consult visits:  None since last CCM contact  Hospital visits:  None in previous 6 months  Medications: Outpatient Encounter Medications as of 11/13/2020  Medication Sig   ALPRAZolam (XANAX) 0.25 MG tablet Take 1 tablet (0.25 mg total) by mouth 2 (two) times daily as needed for anxiety.   amLODipine (NORVASC) 5 MG tablet TAKE 1 TABLET BY MOUTH EVERY DAY   amoxicillin (AMOXIL) 500 MG capsule Take all 4 tablets by mouth 1 hour before dental procedure   atorvastatin (LIPITOR) 10 MG tablet Take 1 tablet (10 mg total) by mouth once a week.   diphenoxylate-atropine (LOMOTIL) 2.5-0.025 MG tablet Take 1 tablet by mouth daily as needed for diarrhea or loose stools.   glipiZIDE (GLUCOTROL XL) 5 MG 24 hr tablet TAKE 1 TABLET BY MOUTH EVERY DAY WITH BREAKFAST   glucose blood (ONETOUCH ULTRA) test strip Use to check blood sugar once a day. Dx Code E11.42   lisinopril-hydrochlorothiazide (ZESTORETIC) 20-12.5 MG tablet TAKE 2 TABLETS BY MOUTH EVERY DAY   metFORMIN (GLUCOPHAGE-XR) 750 MG 24 hr tablet TAKE 1 TABLET BY MOUTH DAILY WITH SUPPER   mometasone (ELOCON) 0.1 % ointment Apply topically 2 (two) times daily.   Multiple Vitamins-Minerals (MULTIVITAMIN MEN PO) Take by mouth.   OneTouch Delica Lancets 99991111 MISC 1 each by Does not apply route daily. Dx Code E11.42   triamcinolone cream (KENALOG) 0.1 % Apply 1 application topically 2 (two) times daily as needed.   No facility-administered encounter medications on file as of 11/13/2020.   Contacted Frank Vega on 11/23/2020 for general disease state.   Patient is not > 5 days past due for refill on the following medications per chart history:  [facility]  Medications: Medication Name/mg Last Fill Days Supply Glipizide '5mg'$                           09/09/2020 90 Atorvastatin '10mg'$                    09/09/2020 84 Lisinopril 20-12.'5mg'$                09/03/2020 90 Metformin '750mg'$                     08/10/2020 90   What concerns do you have about your medications? Patient would like to talk with Frank Vega in regards to his Duloxetine DR 30 mg capsules. He was prescribed it on 06/16/2019. He is wanting to start the medication again. He was reading some of the side effects and would like some reassurance it is ok to take. He is taking care of his cousin's estate that has become overwhelming and costly.   The patient denies side effects with his medications.   How often do you forget or accidentally miss a dose? Rarely  Do you use a pillbox? Yes  Are you having any problems getting your medications from your pharmacy? No  Has the cost of your medications been a concern? No  Since last visit with CPP, no interventions have been made.  The patient has not had an ED visit since last contact.   The patient denies  problems with their health.   He reports concerns or questions for Frank Vega, PharmD at this time. See above.  Counseled patient on:  Importance of taking medication daily without missed doses  Care Gaps: Annual wellness visit in last year? Yes 03/31/2020 Most Recent BP reading: 135/76 on 08/06/2020  If Diabetic: Most recent A1C reading: 6.3 on 10/26/2020 Last eye exam / retinopathy screening: 04/22/2020 Last diabetic foot exam: 03/31/2020  PCP appointment on 04/28/2021  Frank Vega, CPP notified  Frank Vega, [place] Assistant [phone]   I have reviewed the care management and care coordination activities outlined in this encounter and I am certifying that I agree with the content of this note. Attempted to reach patient to discuss duloxetine. His wife answered and requested a call  back.  Frank Vega, PharmD Clinical Pharmacist [facility] at [facility] [phone]

## 2021-01-12 ENCOUNTER — Other Ambulatory Visit: Payer: Self-pay

## 2021-01-12 ENCOUNTER — Ambulatory Visit (INDEPENDENT_AMBULATORY_CARE_PROVIDER_SITE_OTHER): Payer: Medicare Other

## 2021-01-12 DIAGNOSIS — Z23 Encounter for immunization: Secondary | ICD-10-CM

## 2021-02-03 ENCOUNTER — Other Ambulatory Visit: Payer: Self-pay | Admitting: Internal Medicine

## 2021-02-17 ENCOUNTER — Other Ambulatory Visit: Payer: Self-pay | Admitting: Internal Medicine

## 2021-02-18 ENCOUNTER — Other Ambulatory Visit: Payer: Self-pay | Admitting: Internal Medicine

## 2021-02-23 ENCOUNTER — Telehealth: Payer: Self-pay

## 2021-02-23 DIAGNOSIS — F39 Unspecified mood [affective] disorder: Secondary | ICD-10-CM

## 2021-02-23 MED ORDER — DULOXETINE HCL 30 MG PO CPEP: 30.0000 mg | ORAL_CAPSULE | Freq: Every day | ORAL | 0 refills | Status: DC

## 2021-02-23 NOTE — Addendum Note (Signed)
Addended by: Debbora Dus on: 02/23/2021 04:07 PM   Modules accepted: Orders

## 2021-02-23 NOTE — Progress Notes (Signed)
Called patient. I spoke with patient's wife and informed her that Cymbalta has been called in. I also asked her to let the patient know that he needs to make an appointment with Dr. Silvio Pate in the next 30 days. Patient's wife understood and stated she would make sure he knew.   Debbora Dus, CPP notified  [name], [place] Clinical Pharmacy Assistant [phone]  Time Spent: 3 Minutes

## 2021-02-23 NOTE — Telephone Encounter (Addendum)
Dr. Silvio Pate, Do you have any recommendations for his pain? You are more familiar with patient history. His pain has been worse lately. Taking Aleve 220 mg as needed, with minimal relief. Tylenol ineffective. Reports injections were ineffective. Reports no longer following up with pain management. He is asking again about a trial of Cymbalta.  Note from my assistant below:  Patient is having pain in his knees, hips and shoulders. Pain is 4/10. Patient states it has been happening for a couple of years, but it has gotten worse lately. Patient is only able to lift his arms to head height and no higher. Also states he is having a hard time walking due to the pain. Patient has been taking Aleve 220 mg - 1 tablet every couple of days. States if he has to do something outside the house as in errands or doctor appointments he has to take it as he is in so much pain. Patient stated that he saw Dr. Silvio Pate some time ago and was prescribed Cymbalta, but never took it. Patient is wondering if that would help him. Patient stated his wife got out of the hospital last week and has to go back for a right side heart cath. Patient is taking care of his wife the best he can right now so is afraid to start any new medication for the pain as she needs him.

## 2021-02-23 NOTE — Progress Notes (Addendum)
Chronic Care Management Pharmacy Assistant   Name: Frank Vega  MRN: [id number] DOB: 1945-09-30  Reason for Encounter: CCM (General Adherence)   Recent office visits:  01/12/2021 - Frank Vega, CMA - Patient presented for Influenza vaccination. Immunizations: [employer] (high dose 65+).   Recent consult visits:  None since last CCM contact  Hospital visits:  None in previous 6 months  Medications: Outpatient Encounter Medications as of 02/23/2021  Medication Sig   ALPRAZolam (XANAX) 0.25 MG tablet Take 1 tablet (0.25 mg total) by mouth 2 (two) times daily as needed for anxiety.   amLODipine (NORVASC) 5 MG tablet TAKE 1 TABLET BY MOUTH EVERY DAY   amoxicillin (AMOXIL) 500 MG capsule Take all 4 tablets by mouth 1 hour before dental procedure   atorvastatin (LIPITOR) 10 MG tablet Take 1 tablet (10 mg total) by mouth once a week.   diphenoxylate-atropine (LOMOTIL) 2.5-0.025 MG tablet Take 1 tablet by mouth daily as needed for diarrhea or loose stools.   glipiZIDE (GLUCOTROL XL) 5 MG 24 hr tablet TAKE 1 TABLET BY MOUTH EVERY DAY WITH BREAKFAST   glucose blood (ONETOUCH ULTRA) test strip Use to check blood sugar once a day. Dx Code E11.42   lisinopril-hydrochlorothiazide (ZESTORETIC) 20-12.5 MG tablet TAKE 2 TABLETS BY MOUTH EVERY DAY   metFORMIN (GLUCOPHAGE-XR) 750 MG 24 hr tablet TAKE 1 TABLET BY MOUTH DAILY WITH SUPPER   mometasone (ELOCON) 0.1 % ointment Apply topically 2 (two) times daily.   Multiple Vitamins-Minerals (MULTIVITAMIN MEN PO) Take by mouth.   OneTouch Delica Lancets 93A MISC 1 each by Does not apply route daily. Dx Code E11.42   triamcinolone cream (KENALOG) 0.1 % Apply 1 application topically 2 (two) times daily as needed.   No facility-administered encounter medications on file as of 02/23/2021.   Contacted Frank Vega on 02/23/2021 for general disease state and medication adherence call.   Patient is not > 5 days past due for refill on the following  medications per chart history:  Star Medications: Medication Name/mg Last Fill Days Supply Glipizide 5 mg   12/08/2020 90                           Atorvastatin 10 mg  12/08/2020 84 Lisinopril HCTZ 20-12.5 mg 11/30/2020 90 Metformin 750 mg  02/06/2021 90     What concerns do you have about your medications? No concerns  The patient denies side effects with his medications.   How often do you forget or accidentally miss a dose? Never  Do you use a pillbox? Yes  Are you having any problems getting your medications from your pharmacy? Yes   Has the cost of your medications been a concern? Yes - Patient states he doesn't know why, but he has to pay $8 a month for his Lisinopril. He doesn't pay anything for all his medications so he doesn't know why he has to pay for that. I discussed patient assistance with the patient, but he stated he makes too much and he is ok with paying $8 a month. Patient stated he realizes it isn't much money, but he doesn't like pharmaceutical companies.   Since last visit with CPP, no interventions have been made:   The patient has not had an ED visit since last contact.   The patient reports the following problems with their health.   he denies  concerns or questions for Frank Vega, Pharm. D at this time.  Counseled patient on:  Frank Vega job taking medications  While speaking with patient he stated that he has been in a lot of pain lately. Patient is having pain in his knees, hips and shoulders. Pain is 4/10. Patient states it has been happening for a couple of years, but it has gotten worse lately. Patient is only able to lift his arms to head height and no higher. Also states he is having a hard time walking due to the pain. Patient has been taking Aleve 220 mg - 1 tablet every couple of days. States if he has to do something outside the house as in errands or doctor appointments he has to take it as he is in so much pain. Patient stated that he saw Dr.  Silvio Vega some time ago and was prescribed Cymbalta, but never took it. Patient is wondering if that would help him. Patient stated his wife got out of the hospital last week and has to go back for a right side heart cath. Patient is taking care of his wife the best he can right now so is afraid to start any new medication for the pain as she needs him.   Care Gaps: Annual wellness visit in last year? Yes 03/31/2020 Most Recent BP reading: 135/76 on 08/06/2020   If Diabetic: Most recent A1C reading: 6.3 on 10/26/2020 Last eye exam / retinopathy screening: 04/22/2020 Last diabetic foot exam: 03/31/2020   PCP appointment on 04/28/2021 for Physical.    Frank Vega, CPP notified   Frank Vega, [place] Assistant [phone]   I have reviewed the care management and care coordination activities outlined in this encounter and I am certifying that I agree with the content of this note. Contacted patient to discuss pain. See telephone note 02/23/21 (PAIN).  Frank Vega, PharmD Clinical Pharmacist [facility] at [facility] [phone]

## 2021-02-23 NOTE — Telephone Encounter (Addendum)
Amy, can you let patient know we sent in 30 days of Cymbalta. He needs to schedule an appt with Dr. Silvio Pate within a month (prior to his already scheduled 2 month appt).

## 2021-03-09 IMAGING — DX DG SHOULDER 2+V*R*
3 series · 3 of 3 positions shown · non-contrast
Comparison: None.

CLINICAL DATA: Right shoulder loss of motion. Clinical concern for
differentiating osteoarthritis and frozen shoulders possible causes.

EXAM:
RIGHT SHOULDER - 2+ VIEW

[shoulder axial]
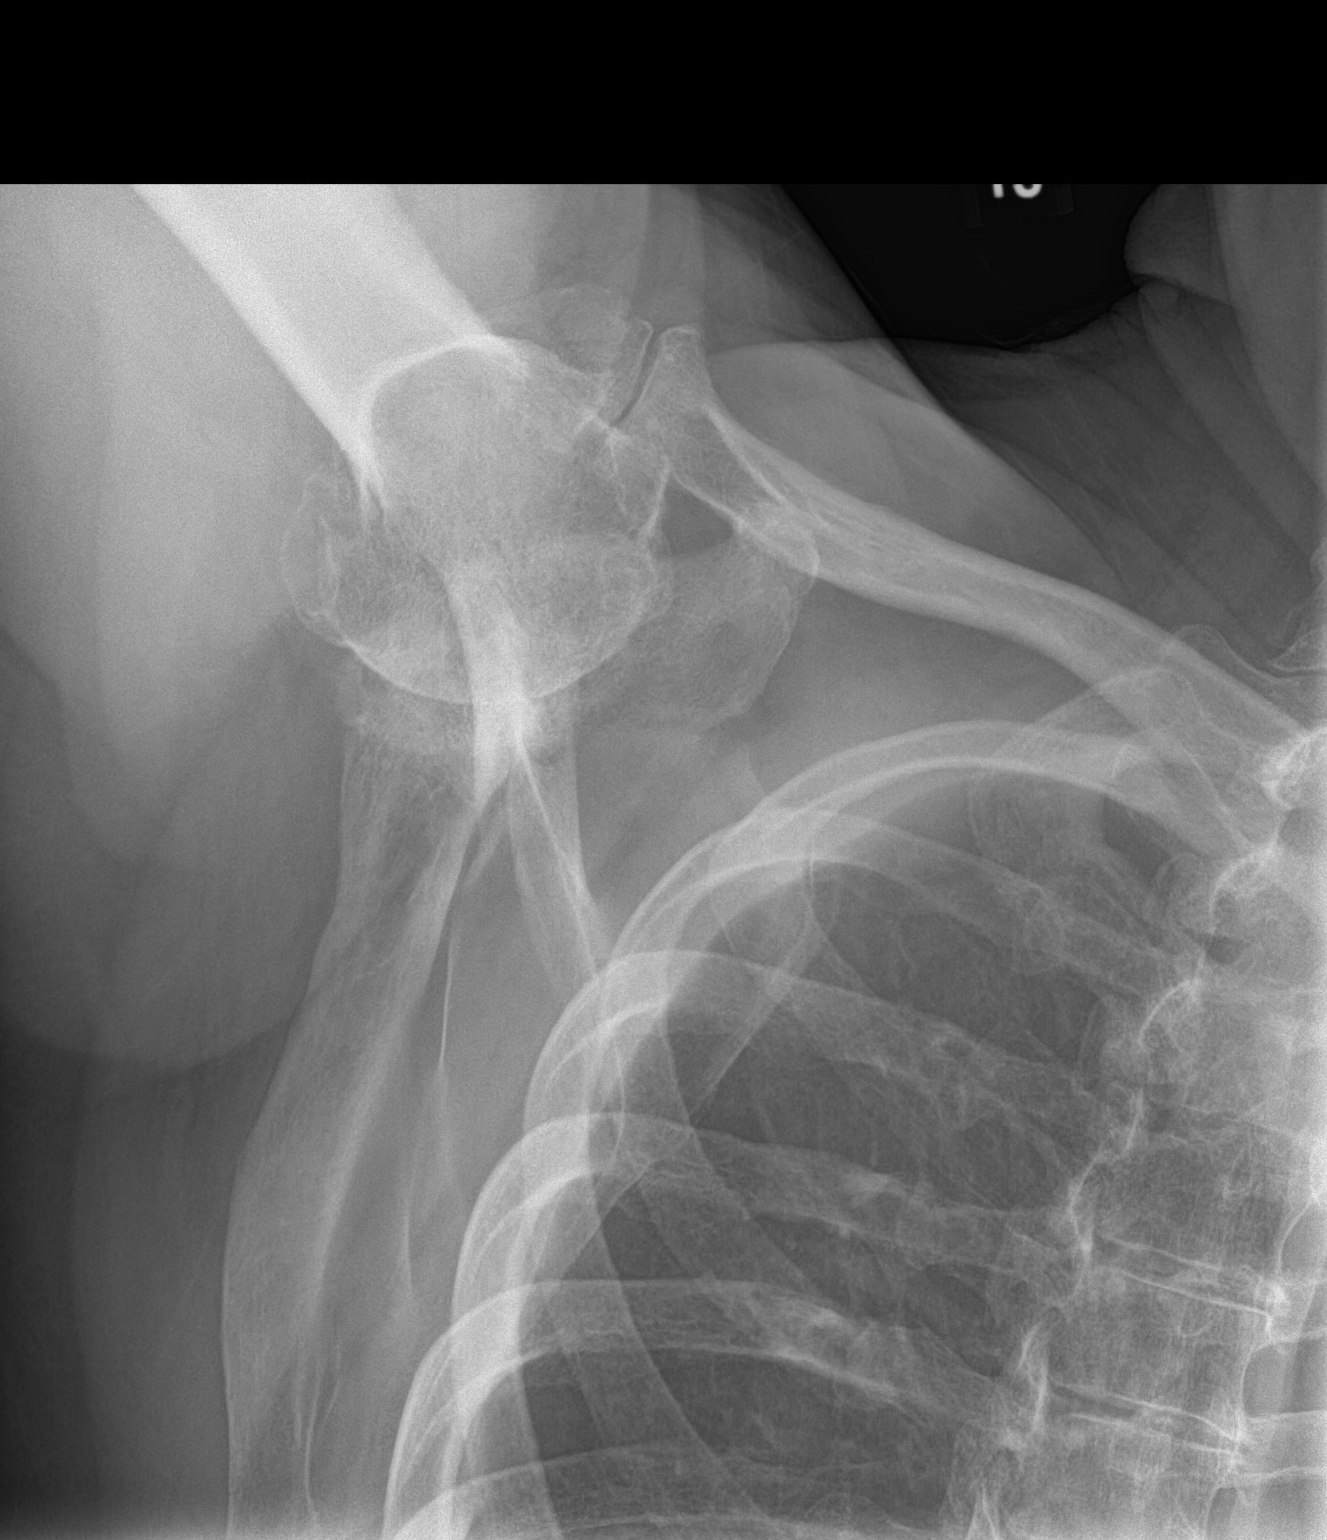

[shoulder obl]
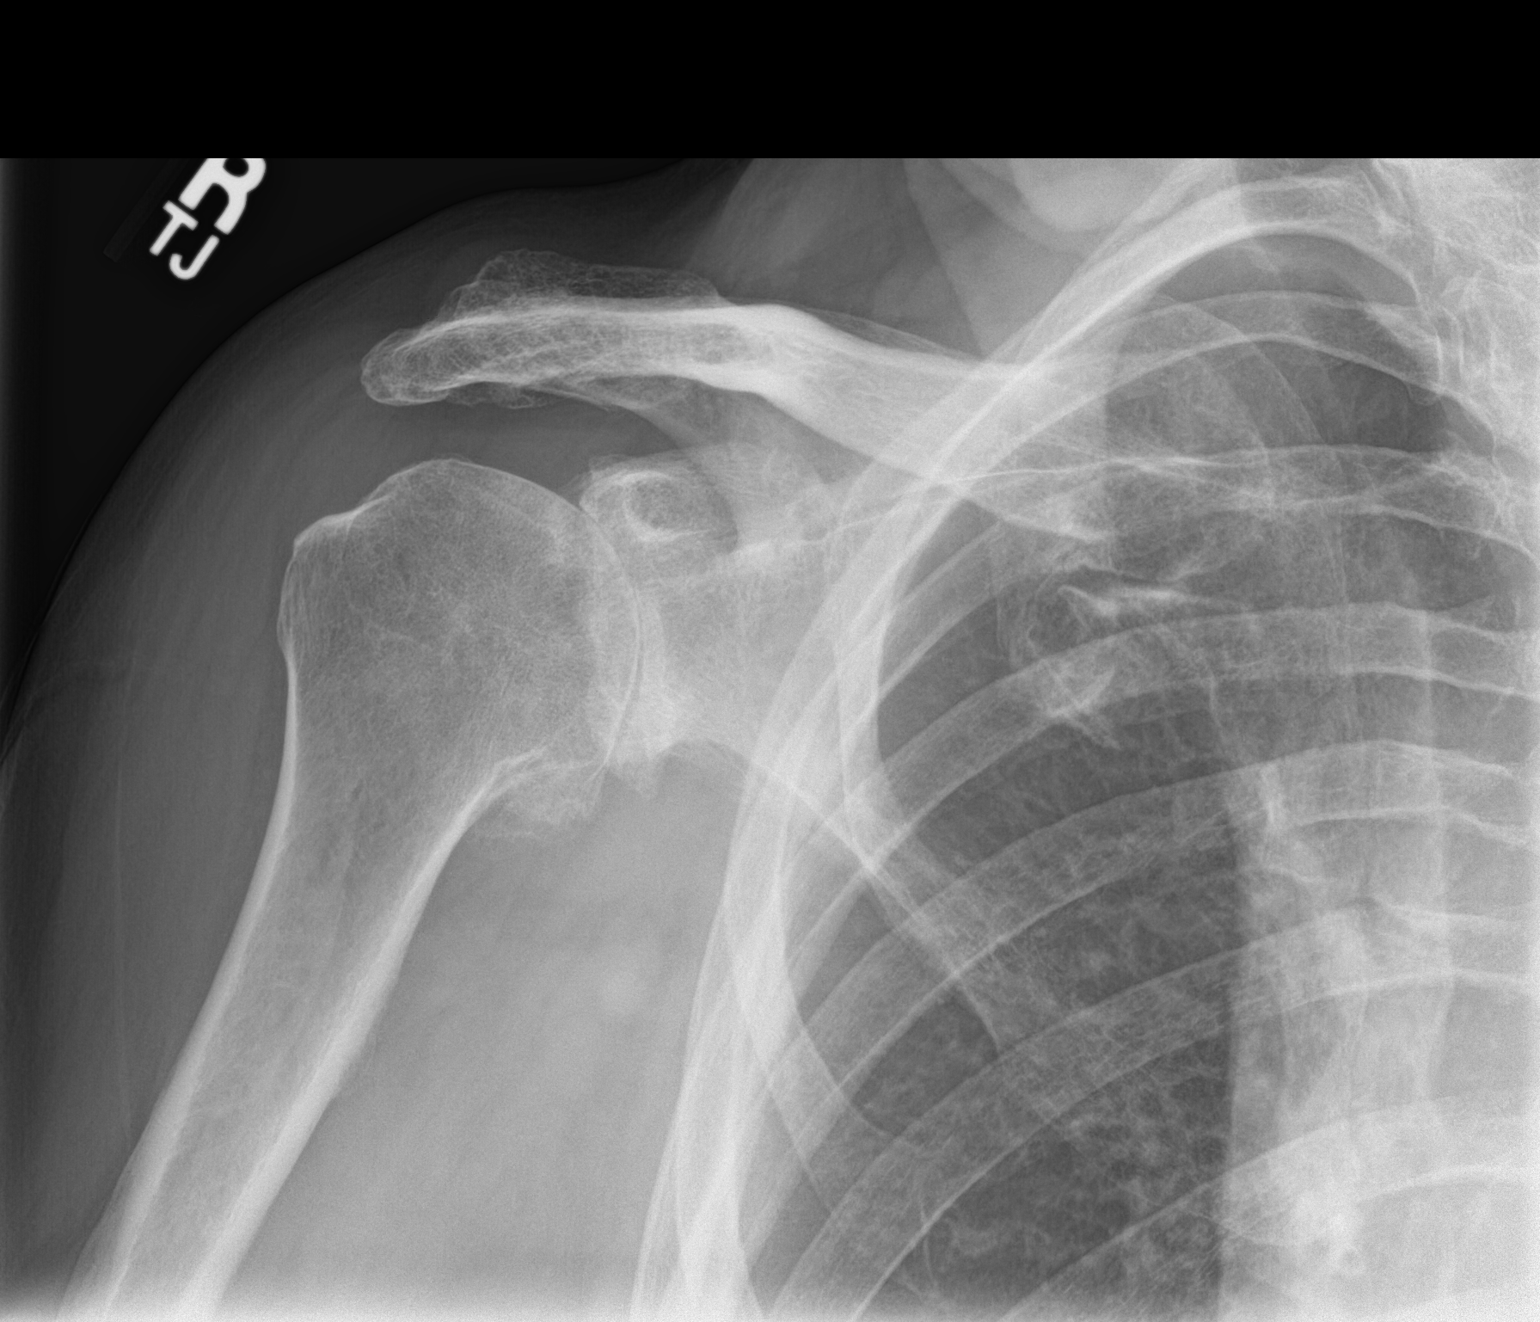

[shoulder y-view]
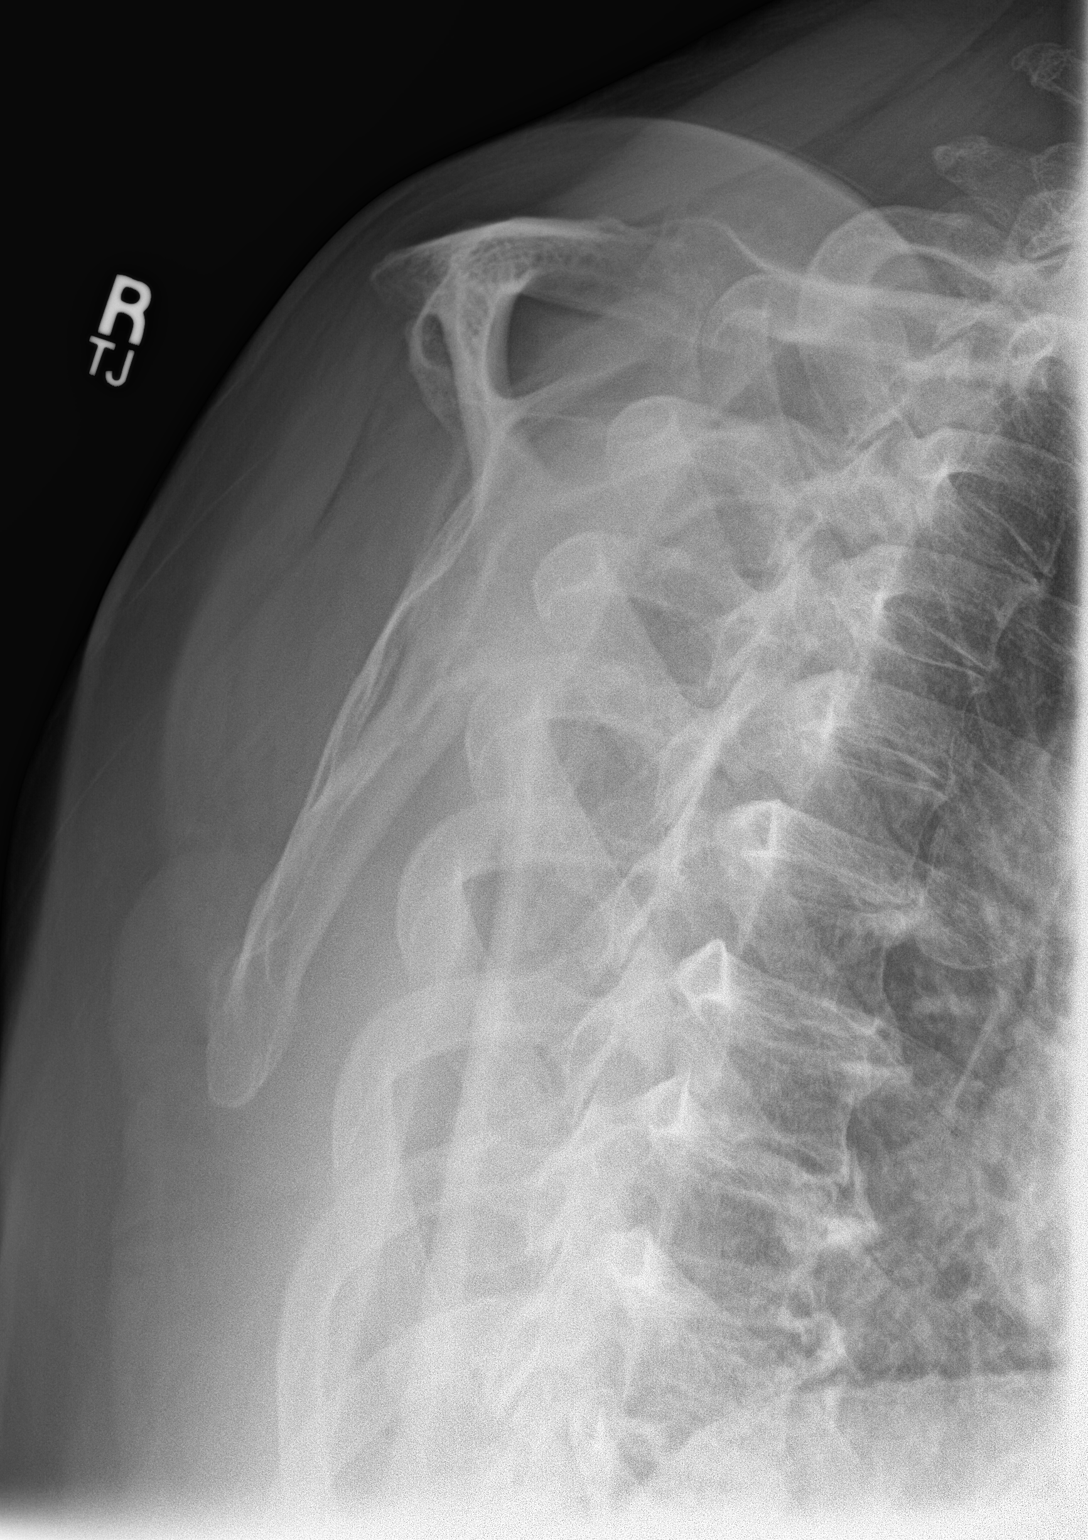

[3 of 3 positions shown; findings below may reference images not displayed]

FINDINGS: Marked glenohumeral joint space narrowing with moderate to
large-sized inferior spurs. Moderate to marked acromioclavicular
joint space narrowing with mild-to-moderate spur formation.
IMPRESSION: Marked right shoulder degenerative changes.

## 2021-03-10 ENCOUNTER — Telehealth: Payer: Self-pay

## 2021-03-10 DIAGNOSIS — F39 Unspecified mood [affective] disorder: Secondary | ICD-10-CM

## 2021-03-10 NOTE — Telephone Encounter (Signed)
Patient has not yet scheduled follow up with PCP for pain. Please call and see how he is doing on duloxetine and remind him to schedule a follow up visit with PCP soon. PCP would like to see him in January. (See telephone note 12/20 for full context).

## 2021-03-10 NOTE — Progress Notes (Signed)
Spoke with patient to follow up with his pain. Patient stated the Duloxetine seems to be helping. His pain previously was a 5/10 and now is 2/10. Patient can tell the pain is getting better.   Patient wold like a refill of his Diphenoxylate-Atrophine 2.5-0.025 mg.   I asked patient to call the office and schedule a follow-up appt with Dr. Silvio Pate in January. Patient stated it is going to be hard to so. Patient is currently taking care of his wife who is due for a right side heart cath on Friday 03/12/2021. Patient stated he is also finishing up some items from an estate that he was designated on. Between the estate and his wife he is doing all he can.   Debbora Dus, CPP notified  [name], [place] Clinical Pharmacy Assistant [phone]  Time Spent:  10 Minutes

## 2021-03-11 MED ORDER — DULOXETINE HCL 30 MG PO CPEP: 30.0000 mg | ORAL_CAPSULE | Freq: Every day | ORAL | 1 refills | Status: DC

## 2021-03-11 MED ORDER — DIPHENOXYLATE-ATROPINE 2.5-0.025 MG PO TABS: 1.0000 | ORAL_TABLET | Freq: Every day | ORAL | 0 refills | Status: DC | PRN

## 2021-03-11 NOTE — Telephone Encounter (Signed)
Okay to wait for the February appt since he is doing better I sent the refills

## 2021-03-11 NOTE — Addendum Note (Signed)
Addended by: Viviana Simpler I on: 03/11/2021 01:42 PM   Modules accepted: Orders

## 2021-04-12 ENCOUNTER — Other Ambulatory Visit: Payer: Self-pay | Admitting: Internal Medicine

## 2021-04-13 ENCOUNTER — Telehealth: Payer: Self-pay | Admitting: Internal Medicine

## 2021-04-13 NOTE — Telephone Encounter (Signed)
Spoke to pt. He was asking about refill for lomotil. I advised him that was sent to the pharmacy 03-11-21.   He said he has been having itching at night. Asking if metformin could be causing it. He has been on it awhile. I suggested asking if the pharmacy has a new manufacturer. If it is not getting better, he will make an appt.

## 2021-04-13 NOTE — Telephone Encounter (Signed)
Pt called asking for a call back to discuss medications diphenoxylate-atropine (LOMOTIL) 2.5-0.025 MG tablet and metFORMIN (GLUCOPHAGE-XR) 750 MG 24 hr tablet. Please advise.

## 2021-04-21 ENCOUNTER — Other Ambulatory Visit: Payer: Self-pay | Admitting: Internal Medicine

## 2021-04-28 ENCOUNTER — Encounter: Payer: Medicare Other | Admitting: Internal Medicine

## 2021-05-19 ENCOUNTER — Other Ambulatory Visit: Payer: Self-pay | Admitting: Internal Medicine

## 2021-05-21 ENCOUNTER — Encounter: Payer: Medicare Other | Admitting: Internal Medicine

## 2021-06-06 ENCOUNTER — Other Ambulatory Visit: Payer: Self-pay | Admitting: Internal Medicine

## 2021-06-06 DIAGNOSIS — F39 Unspecified mood [affective] disorder: Secondary | ICD-10-CM

## 2021-07-26 ENCOUNTER — Ambulatory Visit (INDEPENDENT_AMBULATORY_CARE_PROVIDER_SITE_OTHER): Payer: Medicare Other | Admitting: Internal Medicine

## 2021-07-26 ENCOUNTER — Encounter: Payer: Self-pay | Admitting: Internal Medicine

## 2021-07-26 VITALS — BP 106/64 | HR 80 | Temp 97.8°F | Ht 67.0 in | Wt 214.0 lb

## 2021-07-26 DIAGNOSIS — F39 Unspecified mood [affective] disorder: Secondary | ICD-10-CM | POA: Diagnosis not present

## 2021-07-26 DIAGNOSIS — R972 Elevated prostate specific antigen [PSA]: Secondary | ICD-10-CM

## 2021-07-26 DIAGNOSIS — E1142 Type 2 diabetes mellitus with diabetic polyneuropathy: Secondary | ICD-10-CM | POA: Diagnosis not present

## 2021-07-26 DIAGNOSIS — I10 Essential (primary) hypertension: Secondary | ICD-10-CM | POA: Diagnosis not present

## 2021-07-26 DIAGNOSIS — Z0000 Encounter for general adult medical examination without abnormal findings: Secondary | ICD-10-CM

## 2021-07-26 LAB — HM DIABETES FOOT EXAM

## 2021-07-26 MED ORDER — TRIAMCINOLONE ACETONIDE 0.1 % EX CREA: 1.0000 "application " | TOPICAL_CREAM | Freq: Two times a day (BID) | CUTANEOUS | 1 refills | Status: DC | PRN

## 2021-07-26 NOTE — Progress Notes (Signed)
Hearing Screening - Comments:: Has hearing aids. Not wearing them today. Vision Screening - Comments:: Will Make Appt for DM Eye Exam

## 2021-07-26 NOTE — Assessment & Plan Note (Signed)
BP Readings from Last 3 Encounters:  07/26/21 106/64  10/26/20 122/76  07/08/20 112/66   Good control on lisinopril/HCTZ 20/12.5

## 2021-07-26 NOTE — Assessment & Plan Note (Signed)
Control seems to still be good---on metformin '750mg'$  and glipizide '5mg'$  daily Mild neuropathy--on duloxetine '30mg'$  daily Statin once a week

## 2021-07-26 NOTE — Assessment & Plan Note (Signed)
I have personally reviewed the Medicare Annual Wellness questionnaire and have noted 1. The patient's medical and social history 2. Their use of alcohol, tobacco or illicit drugs 3. Their current medications and supplements 4. The patient's functional ability including ADL's, fall risks, home safety risks and hearing or visual             impairment. 5. Diet and physical activities 6. Evidence for depression or mood disorders  The patients weight, height, BMI and visual acuity have been recorded in the chart I have made referrals, counseling and provided education to the patient based review of the above and I have provided the pt with a written personalized care plan for preventive services.  I have provided you with a copy of your personalized plan for preventive services. Please take the time to review along with your updated medication list.  Due for colonoscopy next year Will recheck PSA Discussed exercise Prefers no more COVID boosters Flu vaccine in the fall Consider Td and shingrix at the pharmacy

## 2021-07-26 NOTE — Assessment & Plan Note (Signed)
If fairly stable, will not check anymore

## 2021-07-26 NOTE — Progress Notes (Signed)
Subjective:    Patient ID: Frank Vega, male    DOB: 1945-10-31, 76 y.o.   MRN: [id number]  HPI Here for Medicare wellness visit and follow up of chronic health conditions Reviewed advanced directives Reviewed other doctors--Dr Dingeldein--ophthal, Dr Corinna Capra try to change to Dr Roland Rack), Dr Lenis Noon, Dr Jessee Avers No hospitalizations or surgery this year No alcohol or tobacco Vision is okay Hearing aides do help No falls Chronic mild mood issues. Not anhedonic but limited time Independent with instrumental ADLs Not really exercising--discussed No worrisome memory issues  Wife's condition has worsened He has to do all the instrumental ADLs She needs stand by for showers, etc [place] last night--and had to call rescue to help get her up Disrupted sleep due to checking on wife and her oxygen, etc He does have a cousin who helps some with housework No overt depression but just stress at times Still on the duloxetine  Checking sugars intermittently Usually under 120 No major issues with feet burning, etc  No chest pain No SOB No dizziness or syncope No edema No headaches No palpitations  Has various joint pains More recent problems with right shoulder---partial frozen Done with back injections and has declined surgery  Current Outpatient Medications on File Prior to Visit  Medication Sig Dispense Refill   ALPRAZolam (XANAX) 0.25 MG tablet Take 1 tablet (0.25 mg total) by mouth 2 (two) times daily as needed for anxiety. 20 tablet 0   amLODipine (NORVASC) 5 MG tablet TAKE 1 TABLET BY MOUTH EVERY DAY 90 tablet 3   amoxicillin (AMOXIL) 500 MG capsule Take all 4 tablets by mouth 1 hour before dental procedure 4 capsule 2   atorvastatin (LIPITOR) 10 MG tablet TAKE 1 TABLET(10 MG) BY MOUTH 1 TIME A WEEK 13 tablet 3   diphenoxylate-atropine (LOMOTIL) 2.5-0.025 MG tablet Take 1 tablet by mouth daily as needed for diarrhea or loose stools. 30 tablet 0    DULoxetine (CYMBALTA) 30 MG capsule TAKE 1 CAPSULE(30 MG) BY MOUTH DAILY 90 capsule 3   glipiZIDE (GLUCOTROL XL) 5 MG 24 hr tablet TAKE 1 TABLET BY MOUTH EVERY DAY WITH BREAKFAST 90 tablet 3   glucose blood (ONETOUCH ULTRA) test strip Use to check blood sugar once a day. Dx Code E11.42 100 each 4   lisinopril-hydrochlorothiazide (ZESTORETIC) 20-12.5 MG tablet TAKE 2 TABLETS BY MOUTH EVERY DAY 180 tablet 3   metFORMIN (GLUCOPHAGE-XR) 750 MG 24 hr tablet TAKE 1 TABLET BY MOUTH DAILY WITH SUPPER 90 tablet 3   mometasone (ELOCON) 0.1 % ointment Apply topically 2 (two) times daily.     Multiple Vitamins-Minerals (MULTIVITAMIN MEN PO) Take by mouth.     OneTouch Delica Lancets 13Y MISC 1 each by Does not apply route daily. Dx Code E11.42 100 each 3   triamcinolone cream (KENALOG) 0.1 % Apply 1 application topically 2 (two) times daily as needed. 45 g 1   No current facility-administered medications on file prior to visit.    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HCC)    GERD (gastroesophageal reflux disease)    Hypertension    IBS (irritable bowel syndrome)    Osteoarthritis of multiple joints     Past Surgical History:  Procedure Laterality Date  COLONOSCOPY WITH PROPOFOL N/A 07/18/2017   Procedure: COLONOSCOPY WITH PROPOFOL;  Surgeon: Lollie Sails, MD;  Location: [facility]COPY;  Service: Endoscopy;  Laterality: N/A;   CYST EXCISION  1952   left leg   KNEE ARTHROSCOPY Left 2011   REPLACEMENT TOTAL KNEE Left    TOTAL HIP ARTHROPLASTY Right     Family History  Problem Relation Age of Onset   Arthritis Mother    Hypertension Mother    Dementia Mother    Heart attack Father    Heart disease Father    Cancer Father        lymphoma    Social History    Socioeconomic History   Marital status: Married    Spouse name: Not on file   Number of children: 1   Years of education: Not on file   Highest education level: Not on file  Occupational History   Occupation: Electrical engineer    Comment: Retired  Tobacco Use   Smoking status: Never   Smokeless tobacco: Never  Scientific laboratory technician Use: Never used  Substance and Sexual Activity   Alcohol use: No   Drug use: No   Sexual activity: Never  Other Topics Concern   Not on file  Social History Narrative   1 daughter--local      No living will   Wife, then daughter, should make health care decisions   Would accept resuscitation   Not sure about tube feeds   Social Determinants of Health   Financial Resource Strain: Not on file  Food Insecurity: Not on file  Transportation Needs: Not on file  Physical Activity: Not on file  Stress: Not on file  Social Connections: Not on file  Intimate Partner Violence: Not on file   Review of Systems Appetite is good Weight is up this year Wears seat belt Has full on top and partial on bottom. Sees dentist No heartburn. Occasional problem swallowing dense meats Bowels somewhat irregular regularly.  No suspicious skin lesions. Does have some itching--with stress. [place] cream helps this      Objective:   Physical Exam Constitutional:      Appearance: Normal appearance.  HENT:     Mouth/Throat:     Comments: No lesions Eyes:     Conjunctiva/sclera: Conjunctivae normal.     Pupils: Pupils are equal, round, and reactive to light.  Cardiovascular:     Rate and Rhythm: Normal rate and regular rhythm.     Pulses: Normal pulses.     Heart sounds: No murmur heard.   No gallop.  Pulmonary:     Effort: Pulmonary effort is normal.     Breath sounds: Normal breath sounds. No wheezing or rales.  Abdominal:     Palpations: Abdomen is soft.     Tenderness: There is no abdominal tenderness.  Musculoskeletal:     Cervical  back: Neck supple.     Right lower leg: No edema.     Left lower leg: No edema.  Lymphadenopathy:     Cervical: No cervical adenopathy.  Skin:    Findings: No lesion or rash.     Comments: No foot lesions  Neurological:     General: No focal deficit present.     Mental Status: He is alert and oriented to person, place, and time.     Comments: Mildly decreased sensation in feet Mini-cog normal  Psychiatric:        Mood and Affect: Mood normal.  Behavior: Behavior normal.           Assessment & Plan:

## 2021-07-26 NOTE — Assessment & Plan Note (Signed)
Mostly reactive depression and situational stress Doing okay with duloxetine '30mg'$  Discussed getting more help to care for his wife

## 2021-07-27 LAB — MICROALBUMIN / CREATININE URINE RATIO
Creatinine,U: 127.5 mg/dL
Microalb Creat Ratio: 0.9 mg/g (ref 0.0–30.0)
Microalb, Ur: 1.2 mg/dL (ref 0.0–1.9)

## 2021-07-27 LAB — CBC
HCT: 41.7 % (ref 39.0–52.0)
Hemoglobin: 14.1 g/dL (ref 13.0–17.0)
MCHC: 33.7 g/dL (ref 30.0–36.0)
MCV: 94.9 fl (ref 78.0–100.0)
Platelets: 261 10*3/uL (ref 150.0–400.0)
RBC: 4.4 Mil/uL (ref 4.22–5.81)
RDW: 13.8 % (ref 11.5–15.5)
WBC: 9.7 10*3/uL (ref 4.0–10.5)

## 2021-07-27 LAB — COMPREHENSIVE METABOLIC PANEL
ALT: 15 U/L (ref 0–53)
AST: 14 U/L (ref 0–37)
Albumin: 4.5 g/dL (ref 3.5–5.2)
Alkaline Phosphatase: 80 U/L (ref 39–117)
BUN: 15 mg/dL (ref 6–23)
CO2: 29 mEq/L (ref 19–32)
Calcium: 9.7 mg/dL (ref 8.4–10.5)
Chloride: 100 mEq/L (ref 96–112)
Creatinine, Ser: 0.81 mg/dL (ref 0.40–1.50)
GFR: 85.9 mL/min (ref >60.00)
Glucose, Bld: 80 mg/dL (ref 70–99)
Potassium: 4.2 mEq/L (ref 3.5–5.1)
Sodium: 137 mEq/L (ref 135–145)
Total Bilirubin: 0.8 mg/dL (ref 0.2–1.2)
Total Protein: 7.3 g/dL (ref 6.0–8.3)

## 2021-07-27 LAB — LIPID PANEL
Cholesterol: 223 mg/dL — ABNORMAL HIGH (ref 0–200)
HDL: 46.3 mg/dL (ref >39.00)
NonHDL: 177.06
Total CHOL/HDL Ratio: 5
Triglycerides: 227 mg/dL — ABNORMAL HIGH (ref 0.0–149.0)
VLDL: 45.4 mg/dL — ABNORMAL HIGH (ref 0.0–40.0)

## 2021-07-27 LAB — LDL CHOLESTEROL, DIRECT: Direct LDL: 142 mg/dL

## 2021-07-27 LAB — PSA: PSA: 4.75 ng/mL — ABNORMAL HIGH (ref 0.10–4.00)

## 2021-07-27 LAB — HEMOGLOBIN A1C: Hgb A1c MFr Bld: 6.5 % (ref 4.6–6.5)

## 2021-08-30 DIAGNOSIS — L218 Other seborrheic dermatitis: Secondary | ICD-10-CM | POA: Diagnosis not present

## 2021-08-30 DIAGNOSIS — L309 Dermatitis, unspecified: Secondary | ICD-10-CM | POA: Diagnosis not present

## 2021-08-30 DIAGNOSIS — L821 Other seborrheic keratosis: Secondary | ICD-10-CM | POA: Diagnosis not present

## 2021-08-30 DIAGNOSIS — D2262 Melanocytic nevi of left upper limb, including shoulder: Secondary | ICD-10-CM | POA: Diagnosis not present

## 2021-08-30 DIAGNOSIS — X32XXXA Exposure to sunlight, initial encounter: Secondary | ICD-10-CM | POA: Diagnosis not present

## 2021-08-30 DIAGNOSIS — D2261 Melanocytic nevi of right upper limb, including shoulder: Secondary | ICD-10-CM | POA: Diagnosis not present

## 2021-08-30 DIAGNOSIS — L570 Actinic keratosis: Secondary | ICD-10-CM | POA: Diagnosis not present

## 2021-08-30 DIAGNOSIS — B360 Pityriasis versicolor: Secondary | ICD-10-CM | POA: Diagnosis not present

## 2021-08-30 DIAGNOSIS — D225 Melanocytic nevi of trunk: Secondary | ICD-10-CM | POA: Diagnosis not present

## 2021-08-30 DIAGNOSIS — Z85828 Personal history of other malignant neoplasm of skin: Secondary | ICD-10-CM | POA: Diagnosis not present

## 2021-09-20 ENCOUNTER — Telehealth: Payer: Self-pay

## 2021-09-20 DIAGNOSIS — J22 Unspecified acute lower respiratory infection: Secondary | ICD-10-CM | POA: Diagnosis not present

## 2021-09-20 DIAGNOSIS — R509 Fever, unspecified: Secondary | ICD-10-CM | POA: Diagnosis not present

## 2021-09-20 DIAGNOSIS — J029 Acute pharyngitis, unspecified: Secondary | ICD-10-CM | POA: Diagnosis not present

## 2021-09-20 DIAGNOSIS — Z03818 Encounter for observation for suspected exposure to other biological agents ruled out: Secondary | ICD-10-CM | POA: Diagnosis not present

## 2021-09-20 DIAGNOSIS — U071 COVID-19: Secondary | ICD-10-CM | POA: Diagnosis not present

## 2021-09-20 NOTE — Telephone Encounter (Signed)
I spoke with pt to offer an appt at [facility] today but pt said he is taking care of his wife who just got home from hospital few days ago. Pt is not sure what time he will be able to schedule appt due to taking care of wife and pt will go to [facility] some time today. Sending note to Dr Silvio Pate and Larene Beach CMA.

## 2021-09-20 NOTE — Telephone Encounter (Signed)
[facility] - Client TELEPHONE ADVICE RECORD [facility] Patient Name: Frank Vega Gender: Male DOB: 1945-09-27 Age: 76 Y 2 M 23 D Return Phone Number: [phone] (Primary), [phone] (Secondary) Address: City/ State/ Zip: [name]  [address] Client [facility] - Client Client Site [place] - Day Provider Viviana Simpler- MD Contact Type Call Who Is Calling Patient / Member / Family / Caregiver Call Type Triage / Clinical Relationship To Patient Self Return Phone Number [phone] (Primary) Chief Complaint Sore Throat Reason for Call Symptomatic / Request for [place] states he has had a fever for 4 days and sore throat, neg for covid. no appointments available today [facility] Translation No Nurse Assessment Nurse: Windle Guard, RN, Lesa Date/Time Eilene Ghazi Time): 09/20/2021 9:41:01 AM Confirm and document reason for call. If symptomatic, describe symptoms. ---Caller states he has a sore throat and ear congestion. He fever is now gone Does the patient have any new or worsening symptoms? ---Yes Will a triage be completed? ---Yes Related visit to physician within the last 2 weeks? ---No Does the PT have any chronic conditions? (i.e. diabetes, asthma, this includes High risk factors for pregnancy, etc.) ---Yes List chronic conditions. ---Diabetes Is this a behavioral health or substance abuse call? ---No Guidelines Guideline Title Affirmed Question Affirmed Notes Nurse Date/Time (Eastern Time) Sore Throat [1] Difficulty breathing AND [2] not severe Conner, RN, Lesa 09/20/2021 9:42:56 AM Disp. Time Eilene Ghazi Time) Disposition Final User 09/20/2021 9:46:16 AM Go to ED Now (or PCP triage) Yes Windle Guard, RN, Lesa Final Disposition 09/20/2021 9:46:16 AM Go to ED Now (or PCP triage) Yes Conner, RN, Lesa PLEASE NOTE: All  timestamps contained within this report are represented as [place] Standard Time. CONFIDENTIALTY NOTICE: This fax transmission is intended only for the addressee. It contains information that is legally privileged, confidential or otherwise protected from use or disclosure. If you are not the intended recipient, you are strictly prohibited from reviewing, disclosing, copying using or disseminating any of this information or taking any action in reliance on or regarding this information. If you have received this fax in error, please notify us immediately by telephone so that we can arrange for its return to [place]. Phone: [phone], Toll-Free: [phone], Fax: [phone] Page: 2 of 2 Call Id: [id number] [place] Disagree/Comply Comply Caller Understands Yes PreDisposition Home Care Care Advice Given Per Guideline GO TO ED NOW (OR PCP TRIAGE): * IF NO PCP (PRIMARY CARE PROVIDER) SECOND-LEVEL TRIAGE: You need to be seen within the next hour. Go to the [facility] at _____________ [place] as soon as you can. CARE ADVICE given per Sore Throat (Adult) guideline. Referrals GO TO FACILITY REFUSED GO TO FACILITY OTHER - SPECIF

## 2021-09-21 NOTE — Telephone Encounter (Signed)
Tested positive for covid yesterday. Wife said he is sleeping right now. Seems to be doing okay.

## 2021-11-12 ENCOUNTER — Other Ambulatory Visit: Payer: Self-pay | Admitting: Internal Medicine

## 2021-11-16 ENCOUNTER — Telehealth: Payer: Self-pay | Admitting: Internal Medicine

## 2021-11-16 NOTE — Telephone Encounter (Signed)
Per appt notes pt already has appt scheduled with Dr Silvio Pate 11/17/21 at 12:15 PM. Access nurse note has care advice given to pt. Sending note to Dr Silvio Pate who is out of office and [facility] CMA.

## 2021-11-16 NOTE — Telephone Encounter (Signed)
[facility] - Client TELEPHONE ADVICE RECORD [facility] Patient Name: Frank Vega Gender: Male DOB: 1946-03-07 Age: 76 Y 61 M 19 D Return Phone Number: [phone] (Primary), [phone] (Secondary) Address: City/ State/ Zip: [place]  [address] Client [facility] - Client Client Site [place] - Day Provider Viviana Simpler- MD Contact Type Call Who Is Calling Patient / Member / Family / Caregiver Call Type Triage / Clinical Caller Name Ory Elting Relationship To Patient Spouse Return Phone Number [phone] (Primary) Chief Complaint Urine, Blood In Reason for Call Symptomatic / Request for [place] states her husband in having urination urgency and blood in urine. Translation No Nurse Assessment Nurse: Hassell Done, RN, Melanie Date/Time (Eastern Time): 11/16/2021 2:45:15 PM Confirm and document reason for call. If symptomatic, describe symptoms. ---Caller states he has had a little blood on his underwear a couple of days. Gets up several times at night. Does the patient have any new or worsening symptoms? ---Yes Will a triage be completed? ---Yes Related visit to physician within the last 2 weeks? ---No Does the PT have any chronic conditions? (i.e. diabetes, asthma, this includes High risk factors for pregnancy, etc.) ---Yes List chronic conditions. ---diabetes Is this a behavioral health or substance abuse call? ---No Guidelines Guideline Title Affirmed Question Affirmed Notes Nurse Date/Time (Eastern Time) Urine - Blood In Blood in urine (Exception: Could be normal menstrual bleeding.) Hassell Done, RN, Melanie 11/16/2021 2:47:29 PM Disp. Time Eilene Ghazi Time) Disposition Final User 11/16/2021 2:51:42 PM See PCP within 24 Hours Yes Hassell Done, RN, Threasa Beards PLEASE NOTE: All timestamps contained within this report are represented as [place] Standard  Time. CONFIDENTIALTY NOTICE: This fax transmission is intended only for the addressee. It contains information that is legally privileged, confidential or otherwise protected from use or disclosure. If you are not the intended recipient, you are strictly prohibited from reviewing, disclosing, copying using or disseminating any of this information or taking any action in reliance on or regarding this information. If you have received this fax in error, please notify us immediately by telephone so that we can arrange for its return to [place]. Phone: [phone], Toll-Free: [phone], Fax: [phone] Page: 2 of 2 Call Id: [id number] Final Disposition 11/16/2021 2:51:42 PM See PCP within 24 Hours Yes Hassell Done, RN, Threasa Beards Caller Disagree/Comply Comply Caller Understands Yes PreDisposition Call Doctor Care Advice Given Per Guideline SEE PCP WITHIN 24 HOURS: * IF OFFICE WILL BE OPEN: You need to be examined within the next 24 hours. Call your doctor (or NP/PA) when the office opens and make an appointment. DRINK EXTRA FLUIDS: * Drink extra fluids. * Drink 8 to 10 cups (1,800 to 2,400 ml) of liquids a day. CALL BACK IF: * Fever or pain occurs * You become worse CARE ADVICE given per Urine, Blood In (Adult) guideline. Referrals REFERRED TO PCP OFFIC

## 2021-11-16 NOTE — Telephone Encounter (Signed)
Patient wife called and stated that he has been experiencing some sense of urgency when it comes to using the bathroom and drops of blood after he urinates. Patient was sent to access nurse.

## 2021-11-17 ENCOUNTER — Ambulatory Visit (INDEPENDENT_AMBULATORY_CARE_PROVIDER_SITE_OTHER): Payer: Medicare Other | Admitting: Internal Medicine

## 2021-11-17 ENCOUNTER — Encounter: Payer: Self-pay | Admitting: Internal Medicine

## 2021-11-17 DIAGNOSIS — N471 Phimosis: Secondary | ICD-10-CM | POA: Diagnosis not present

## 2021-11-17 LAB — POC URINALSYSI DIPSTICK (AUTOMATED)
Bilirubin, UA: NEGATIVE
Blood, UA: NEGATIVE
Glucose, UA: NEGATIVE
Ketones, UA: NEGATIVE
Leukocytes, UA: NEGATIVE
Nitrite, UA: NEGATIVE
Protein, UA: NEGATIVE
Spec Grav, UA: 1.015 (ref 1.010–1.025)
Urobilinogen, UA: 0.2 E.U./dL
pH, UA: 8 (ref 5.0–8.0)

## 2021-11-17 NOTE — Telephone Encounter (Signed)
I will check him later this morning. It didn't sound like it was an emergency at that point

## 2021-11-17 NOTE — Addendum Note (Signed)
Addended by: Pilar Grammes on: 11/17/2021 04:02 PM   Modules accepted: Orders

## 2021-11-17 NOTE — Assessment & Plan Note (Signed)
Saw a couple of minute blood spots on his underwear--1-2 days ago Urinalysis shows no blood and no sig symptoms Likely from irritation  Discussed gentle cleaning (he seems to be doing that) If this recurs, will set up with urologist

## 2021-11-17 NOTE — Progress Notes (Signed)
Subjective:    Patient ID: Frank Vega, male    DOB: 10-31-1945, 76 y.o.   MRN: [id number]  HPI Here due to blood in his shorts  Saw a spot of blood on his underwear the past 2 days Very small amount No blood in his urine No dysuria Some urgency---for past 5-6 months (has to be careful not to drink too much before going out)  Some left low back pain over the weekend Built a ramp for his wife ---could have been from this (just jolts of pain briefly)  Has had some white bumps on scrotum (tiny) and no inflammation  Current Outpatient Medications on File Prior to Visit  Medication Sig Dispense Refill   ALPRAZolam (XANAX) 0.25 MG tablet Take 1 tablet (0.25 mg total) by mouth 2 (two) times daily as needed for anxiety. 20 tablet 0   amLODipine (NORVASC) 5 MG tablet TAKE 1 TABLET BY MOUTH EVERY DAY 90 tablet 3   amoxicillin (AMOXIL) 500 MG capsule Take all 4 tablets by mouth 1 hour before dental procedure 4 capsule 2   atorvastatin (LIPITOR) 10 MG tablet TAKE 1 TABLET(10 MG) BY MOUTH 1 TIME A WEEK 13 tablet 3   diphenoxylate-atropine (LOMOTIL) 2.5-0.025 MG tablet Take 1 tablet by mouth daily as needed for diarrhea or loose stools. 30 tablet 0   DULoxetine (CYMBALTA) 30 MG capsule TAKE 1 CAPSULE(30 MG) BY MOUTH DAILY 90 capsule 3   glipiZIDE (GLUCOTROL XL) 5 MG 24 hr tablet TAKE 1 TABLET BY MOUTH EVERY DAY WITH BREAKFAST 90 tablet 3   glucose blood (ONETOUCH ULTRA) test strip Use to check blood sugar once a day. Dx Code E11.42 100 each 4   lisinopril-hydrochlorothiazide (ZESTORETIC) 20-12.5 MG tablet TAKE 2 TABLETS BY MOUTH EVERY DAY 180 tablet 3   metFORMIN (GLUCOPHAGE-XR) 750 MG 24 hr tablet TAKE 1 TABLET BY MOUTH DAILY WITH SUPPER 90 tablet 3   mometasone (ELOCON) 0.1 % ointment Apply topically 2 (two) times daily.     Multiple Vitamins-Minerals (MULTIVITAMIN MEN PO) Take by mouth.     OneTouch Delica Lancets 62V MISC 1 each by Does not apply route daily. Dx Code E11.42 100 each 3    triamcinolone cream (KENALOG) 0.1 % Apply 1 application. topically 2 (two) times daily as needed. 45 g 1   No current facility-administered medications on file prior to visit.    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HCC)    GERD (gastroesophageal reflux disease)    Hypertension    IBS (irritable bowel syndrome)    Osteoarthritis of multiple joints     Past Surgical History:  Procedure Laterality Date   COLONOSCOPY WITH PROPOFOL N/A 07/18/2017   Procedure: COLONOSCOPY WITH PROPOFOL;  Surgeon: Lollie Sails, MD;  Location[facility] END[facility]COPY;  Service: Endoscopy;  Laterality: N/A;   CYST EXCISION  1952   left leg   KNEE ARTHROSCOPY Left 2011   REPLACEMENT TOTAL KNEE Left    TOTAL HIP ARTHROPLASTY Right     Family History  Problem Relation Age of Onset   Arthritis Mother    Hypertension Mother    Dementia Mother    Heart attack Father    Heart disease Father    Cancer Father  lymphoma    Social History   Socioeconomic History   Marital status: Married    Spouse name: Not on file   Number of children: 1   Years of education: Not on file   Highest education level: Not on file  Occupational History   Occupation: Electrical engineer    Comment: Retired  Tobacco Use   Smoking status: Never   Smokeless tobacco: Never  Vaping Use   Vaping Use: Never used  Substance and Sexual Activity   Alcohol use: No   Drug use: No   Sexual activity: Never  Other Topics Concern   Not on file  Social History Narrative   1 daughter--local      No living will   Wife, then daughter, should make health care decisions   Would accept resuscitation   Not sure about tube feeds   Social Determinants of Health   Financial Resource Strain: Low  Risk  (06/14/2019)   Overall Financial Resource Strain (CARDIA)    Difficulty of Paying Living Expenses: Not very hard  Food Insecurity: Not on file  Transportation Needs: Not on file  Physical Activity: Not on file  Stress: Not on file  Social Connections: Not on file  Intimate Partner Violence: Not on file   Review of Systems No fever No N/V    Objective:   Physical Exam Constitutional:      Appearance: Normal appearance.  Abdominal:     Palpations: Abdomen is soft.     Tenderness: There is no abdominal tenderness.  Genitourinary:    Testes: Normal.     Comments: Scrotum quiet Foreskin not retractable Visual of urethra looks normal No inflamed areas Neurological:     Mental Status: He is alert.            Assessment & Plan:

## 2021-12-09 ENCOUNTER — Ambulatory Visit
Admission: RE | Admit: 2021-12-09 | Discharge: 2021-12-09 | Disposition: A | Discharge status: Home / Self Care | Payer: Self-pay | Source: Ambulatory Visit | Attending: Orthopedic Surgery | Admitting: Orthopedic Surgery

## 2021-12-09 ENCOUNTER — Other Ambulatory Visit: Payer: Self-pay

## 2021-12-09 DIAGNOSIS — Z049 Encounter for examination and observation for unspecified reason: Secondary | ICD-10-CM

## 2021-12-11 DIAGNOSIS — R509 Fever, unspecified: Secondary | ICD-10-CM | POA: Diagnosis not present

## 2021-12-11 DIAGNOSIS — R350 Frequency of micturition: Secondary | ICD-10-CM | POA: Diagnosis not present

## 2021-12-11 DIAGNOSIS — R300 Dysuria: Secondary | ICD-10-CM | POA: Diagnosis not present

## 2021-12-11 DIAGNOSIS — N410 Acute prostatitis: Secondary | ICD-10-CM | POA: Diagnosis not present

## 2021-12-17 NOTE — Progress Notes (Deleted)
Referring Physician:  Venia Carbon, MD [address] [place],  [place] [address]  Primary Physician:  Venia Carbon, MD  History of Present Illness: 12/17/2021 [name] has been seen by Dr. Tally Joe 02/25/20 and he discussed XLIF L3-L4. He was sent to PT as well as for SNRB. Looks like he was lost to follow up after injection.      Duration: *** Location: *** Quality: *** Severity: ***  Precipitating: aggravated by *** Modifying factors: made better by *** Weakness: none Timing: *** Bowel/Bladder Dysfunction: none  Conservative measures:  Physical therapy: ***  Multimodal medical therapy including regular antiinflammatories: ***  Injections:  diagnostic right L3 selective nerve root block with local anesthetic only for surgical planning on 08/06/20 at [facility]  Past Surgery: ***  Frank Vega has ***no symptoms of cervical myelopathy.  The symptoms are causing a significant impact on the patient's life.   Review of Systems:  A 10 point review of systems is negative, except for the pertinent positives and negatives detailed in the HPI.  Past Medical History: Past Medical History:  Diagnosis Date   Depression ~2010   situational and now better    Diabetes mellitus with polyneuropathy (HCC)    GERD (gastroesophageal reflux disease)    Hypertension    IBS (irritable bowel syndrome)    Osteoarthritis of multiple joints     Past Surgical History: Past Surgical History:  Procedure Laterality Date   COLONOSCOPY WITH PROPOFOL N/A 07/18/2017   Procedure: COLONOSCOPY WITH PROPOFOL;  Surgeon: Lollie Sails, MD;  Location: [facility] ENDOSCOPY;  Service: Endoscopy;  Laterality: N/A;   CYST EXCISION  1952   left leg   KNEE ARTHROSCOPY Left 2011   REPLACEMENT TOTAL KNEE Left    TOTAL HIP ARTHROPLASTY Right     Allergies: Allergies as of 12/23/2021 - Review Complete 11/17/2021  Allergen Reaction Noted   Paxil [paroxetine hcl]  04/18/2017    Shellfish allergy Swelling 04/18/2017   Latex Rash 04/18/2017    Medications: Outpatient Encounter Medications as of 12/23/2021  Medication Sig   ALPRAZolam (XANAX) 0.25 MG tablet Take 1 tablet (0.25 mg total) by mouth 2 (two) times daily as needed for anxiety.   amLODipine (NORVASC) 5 MG tablet TAKE 1 TABLET BY MOUTH EVERY DAY   amoxicillin (AMOXIL) 500 MG capsule Take all 4 tablets by mouth 1 hour before dental procedure   atorvastatin (LIPITOR) 10 MG tablet TAKE 1 TABLET(10 MG) BY MOUTH 1 TIME A WEEK   diphenoxylate-atropine (LOMOTIL) 2.5-0.025 MG tablet Take 1 tablet by mouth daily as needed for diarrhea or loose stools.   DULoxetine (CYMBALTA) 30 MG capsule TAKE 1 CAPSULE(30 MG) BY MOUTH DAILY   glipiZIDE (GLUCOTROL XL) 5 MG 24 hr tablet TAKE 1 TABLET BY MOUTH EVERY DAY WITH BREAKFAST   glucose blood (ONETOUCH ULTRA) test strip Use to check blood sugar once a day. Dx Code E11.42   lisinopril-hydrochlorothiazide (ZESTORETIC) 20-12.5 MG tablet TAKE 2 TABLETS BY MOUTH EVERY DAY   metFORMIN (GLUCOPHAGE-XR) 750 MG 24 hr tablet TAKE 1 TABLET BY MOUTH DAILY WITH SUPPER   mometasone (ELOCON) 0.1 % ointment Apply topically 2 (two) times daily.   Multiple Vitamins-Minerals (MULTIVITAMIN MEN PO) Take by mouth.   OneTouch Delica Lancets 26S MISC 1 each by Does not apply route daily. Dx Code E11.42   triamcinolone cream (KENALOG) 0.1 % Apply 1 application. topically 2 (two) times daily as needed.   No facility-administered encounter medications on file as  of 12/23/2021.    Social History: Social History   Tobacco Use   Smoking status: Never   Smokeless tobacco: Never  Vaping Use   Vaping Use: Never used  Substance Use Topics   Alcohol use: No   Drug use: No    Family Medical History: Family History  Problem Relation Age of Onset   Arthritis Mother    Hypertension Mother    Dementia Mother    Heart attack Father    Heart disease Father    Cancer Father        lymphoma     Physical Examination: There were no vitals filed for this visit.  General: Patient is well developed, well nourished, calm, collected, and in no apparent distress. Attention to examination is appropriate.  Respiratory: Patient is breathing without any difficulty.   NEUROLOGICAL:     Awake, alert, oriented to person, place, and time.  Speech is clear and fluent. Fund of knowledge is appropriate.   Cranial Nerves: Pupils equal round and reactive to light.  Facial tone is symmetric.  Facial sensation is symmetric.  ROM of spine:  *** ROM of cervical spine *** pain *** ROM of lumbar spine *** pain  No abnormal lesions on exposed skin.   Strength: Side Biceps Triceps Deltoid Interossei Grip Wrist Ext. Wrist Flex.  R '5 5 5 5 5 5 5  '$ L '5 5 5 5 5 5 5   '$ Side Iliopsoas Quads Hamstring PF DF EHL  R '5 5 5 5 5 5  '$ L '5 5 5 5 5 5   '$ Reflexes are ***2+ and symmetric at the biceps, triceps, brachioradialis, patella and achilles.   Hoffman's is absent.  Clonus is not present.   Bilateral upper and lower extremity sensation is intact to light touch.    No evidence of dysmetria noted.  Gait is normal.   ***No difficulty with tandem gait.    Medical Decision Making  Imaging: ***  I have personally reviewed the images and agree with the above interpretation.  Assessment and Plan: [name] is a pleasant 76 y.o. male with ***  Above treatment options discussed with patient and following plan made:   - Order for physical therapy for *** spine ***. - Continue on current medications including ***. Reviewed proper dosing along with risks and benefits. Take and NSAIDs with food.      I spent a total of *** minutes in face-to-face and non-face-to-face activities related to this patient's care today.  Thank you for involving me in the care of this patient.   Geronimo Boot PA-C Dept. of Neurosurgery

## 2021-12-19 ENCOUNTER — Other Ambulatory Visit: Payer: Self-pay | Admitting: Internal Medicine

## 2021-12-23 ENCOUNTER — Ambulatory Visit: Payer: Self-pay | Admitting: Orthopedic Surgery

## 2022-01-19 NOTE — Progress Notes (Unsigned)
Referring Physician:  Venia Carbon, MD [address] [place],  [place] [address]  Primary Physician:  Frank Carbon, MD  History of Present Illness: 01/20/2022  History of HTN, chronic venous insufficiency, IBS, DM with polyneuropathy.   [name] has been seen by Dr. Tally [facility]ega 02/25/20 and he discussed XLIF L3-L4. He was sent to PT as well as for SNRB. Looks like he was lost to follow up after injection.   He has constant LBP with occasional posterior left leg pain to his foot x 1 year. He also notes increased back pain when standing/walking- his legs feel heavy. Symptoms are better when he has a shopping cart. He has weakness in both legs. No numbness or tingling. He has trouble climbing stairs.   His wife has pulmonary HTN and he is her caregiver.   No long term improvement with injections.   He does not smoke.   He has numbness in both hands. He has difficulty with buttons. He drops things. He notes some balance issues. He has intermittent neck pain. No arm pain.   No bowel or bladder incontinence.   Conservative measures:  Physical therapy: none  Multimodal medical therapy including regular antiinflammatories: aleve prn  Injections:  diagnostic right L3 selective nerve root block with local anesthetic only for surgical planning on 08/06/20 at [facility]  Past Surgery: no spinal surgery.   [facility] has symptoms of cervical myelopathy. He has dexterity issues, he is dropping things, and he has balance issues.   The symptoms are causing a significant impact on the patient's life.   Review of Systems:  A 10 point review of systems is negative, except for the pertinent positives and negatives detailed in the HPI.  Past Medical History: Past Medical History:  Diagnosis Date   Depression ~2010   situational and now better    Diabetes mellitus with polyneuropathy (HCC)    GERD (gastroesophageal reflux disease)    Hypertension    IBS (irritable bowel  syndrome)    Osteoarthritis of multiple joints     Past Surgical History: Past Surgical History:  Procedure Laterality Date   COLONOSCOPY WITH PROPOFOL N/A 07/18/2017   Procedure: COLONOSCOPY WITH PROPOFOL;  Surgeon: Frank Sails, MD;  Location: [facility];  Service: Endoscopy;  Laterality: N/A;   CYST EXCISION  1952   left leg   KNEE ARTHROSCOPY Left 2011   REPLACEMENT TOTAL KNEE Left    TOTAL HIP ARTHROPLASTY Right     Allergies: Allergies as of 01/20/2022 - Review Complete 01/20/2022  Allergen Reaction Noted   Paxil [paroxetine hcl]  04/18/2017   Shellfish allergy Swelling 04/18/2017   Latex Rash 04/18/2017    Medications: Outpatient Encounter Medications as of 01/20/2022  Medication Sig   ALPRAZolam (XANAX) 0.25 MG tablet Take 1 tablet (0.25 mg total) by mouth 2 (two) times daily as needed for anxiety.   amLODipine (NORVASC) 5 MG tablet TAKE 1 TABLET BY MOUTH EVERY DAY   amoxicillin (AMOXIL) 500 MG capsule Take all 4 tablets by mouth 1 hour before dental procedure   atorvastatin (LIPITOR) 10 MG tablet TAKE 1 TABLET(10 MG) BY MOUTH 1 TIME A WEEK   diphenoxylate-atropine (LOMOTIL) 2.5-0.025 MG tablet Take 1 tablet by mouth daily as needed for diarrhea or loose stools.   DULoxetine (CYMBALTA) 30 MG capsule TAKE 1 CAPSULE(30 MG) BY MOUTH DAILY   glipiZIDE (GLUCOTROL XL) 5 MG 24 hr tablet TAKE 1 TABLET BY MOUTH EVERY DAY WITH BREAKFAST  glucose blood (ONETOUCH ULTRA) test strip Use to check blood sugar once a day. Dx Code E11.42   lisinopril-hydrochlorothiazide (ZESTORETIC) 20-12.5 MG tablet TAKE 2 TABLETS BY MOUTH EVERY DAY   metFORMIN (GLUCOPHAGE-XR) 750 MG 24 hr tablet TAKE 1 TABLET BY MOUTH DAILY WITH SUPPER   mometasone (ELOCON) 0.1 % ointment Apply topically 2 (two) times daily.   Multiple Vitamins-Minerals (MULTIVITAMIN MEN PO) Take by mouth.   OneTouch Delica Lancets 45W MISC 1 each by Does not apply route daily. Dx Code E11.42   triamcinolone cream (KENALOG)  0.1 % APPLY TOPICALLY TO THE AFFECTED AREA TWICE DAILY AS NEEDED   No facility-administered encounter medications on file as of 01/20/2022.    Social History: Social History   Tobacco Use   Smoking status: Never   Smokeless tobacco: Never  Vaping Use   Vaping Use: Never used  Substance Use Topics   Alcohol use: No   Drug use: No    Family Medical History: Family History  Problem Relation Age of Onset   Arthritis Mother    Hypertension Mother    Dementia Mother    Heart attack Father    Heart disease Father    Cancer Father        lymphoma    Physical Examination: Vitals:   01/20/22 1001  BP: 128/68    General: Patient is well developed, well nourished, calm, collected, and in no apparent distress. Attention to examination is appropriate.  Respiratory: Patient is breathing without any difficulty.   NEUROLOGICAL:     Awake, alert, oriented to person, place, and time.  Speech is clear and fluent. Fund of knowledge is appropriate.   Cranial Nerves: Pupils equal round and reactive to light.  Facial tone is symmetric.  Facial sensation is symmetric.  ROM of spine:  Limited ROM of lumbar spine with pain on extension.   No abnormal lesions on exposed skin.   Strength: Side Biceps Triceps Deltoid Interossei Grip Wrist Ext. Wrist Flex.  R '5 5 5 5 5 5 5  '$ L '5 5 5 5 5 5 5   '$ Side Iliopsoas Quads Hamstring PF DF EHL  R '5 5 5 5 5 5  '$ L '5 5 5 5 5 5   '$ Reflexes are 2+ and symmetric at the biceps, triceps, brachioradialis, patella and achilles.   Hoffman's is absent.  Clonus is not present.   Bilateral upper and lower extremity sensation is intact to light touch.     He has slow hunched forward gait.    Medical Decision Making  Imaging: Lumbar MRI dated 07/17/19:  FINDINGS: Segmentation: The lowest lumbar type non-rib-bearing vertebra is labeled as L5.   Alignment: 3 mm degenerative retrolisthesis at L1-2 and L2-3 with 4 mm degenerative retrolisthesis at L3-4.    Vertebrae: Disc desiccation throughout the lumbar spine with loss of disc height at L4-5 and to a lesser extent at L5-S1, with associated type 1 and type 2 degenerative endplate changes. Degenerative facet edema on the left at L4-5 and L5-S1. Mild prominence of epidural adipose tissues.   Conus medullaris and cauda equina: Conus extends to the T12-L1 level. Conus and cauda equina appear normal.   Paraspinal and other soft tissues: Mild scarring in the left mid kidney posteriorly.   Disc levels:   L1-2: No impingement. Mild disc bulge.   L2-3: Borderline left foraminal stenosis due to disc bulge and intervertebral spurring.   L3-4: Prominent right and moderate left foraminal stenosis with mild bilateral subarticular lateral recess  stenosis and moderate central narrowing of the thecal sac due to disc bulge, right foraminal disc protrusion, facet arthropathy, and intervertebral spurring. Impingement at this level is considerably worsened compared to 11/20/2009.   L4-5: Mild to moderate bilateral foraminal stenosis with mild right subarticular lateral recess stenosis due to disc osteophyte complex and facet spurring. Mildly worsened from prior.   L5-S1: Moderate left and mild right foraminal stenosis due to disc bulge, intervertebral spurring, and facet arthropathy, worsened from 11/20/2009.   IMPRESSION: 1. Lumbar spondylosis and degenerative disc disease, causing prominent impingement at L3-4; moderate impingement at L5-S1; and mild to moderate impingement at L4-5, as detailed above. Impingement at all 3 of these levels has increased compared to the 2011 exam.     Electronically Signed   By: Van Clines M.D.   On: 07/18/2019 11:31   I have personally reviewed the images and agree with the above interpretation.  Assessment and Plan: [facility] is a pleasant 76 y.o. male with constant LBP with occasional posterior left leg pain to his foot x 1 year. He also notes  increased back pain when standing/walking- his legs feel heavy. He has weakness in both legs.   He has known lumbar spondylosis L3-S1, mild central stenosis L3-L4, moderate DDD L4-L5 from MRI in 2021. Previous lumbar xrays in 2020 also showed retrolisthesis L3-L4.   He has numbness in both hands. He has difficulty with buttons. He drops things. He notes some balance issues. He has intermittent neck pain. No arm pain.   No cervical imaging available to review.   Treatment options discussed with patient and following plan made:   - MRI of lumbar spine to evaluate for worsening spinal stenosis. No improvement with previous injections, medications, or time.  - Updated lumbar xrays with flexion/extension to look at retrolisthesis L3-L4.  - MRI of cervical spine to evaluate bilateral UE numbness, balance issues, dexterity issues.  - Depending on MRI results, may need to revisit PT prior to surgical discussion for his back. 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  - Will schedule him a follow up to see me once MRIs/xrays are done.   I spent a total of 30 minutes in face-to-face and non-face-to-face activities related to this patient's care today.  Thank you for involving me in the care of this patient.   Geronimo Boot PA-C Dept. of Neurosurgery

## 2022-01-20 ENCOUNTER — Ambulatory Visit (INDEPENDENT_AMBULATORY_CARE_PROVIDER_SITE_OTHER): Payer: Medicare Other | Admitting: Orthopedic Surgery

## 2022-01-20 ENCOUNTER — Encounter: Payer: Self-pay | Admitting: Orthopedic Surgery

## 2022-01-20 VITALS — BP 128/68 | Ht 67.0 in | Wt 224.0 lb

## 2022-01-20 DIAGNOSIS — M5416 Radiculopathy, lumbar region: Secondary | ICD-10-CM

## 2022-01-20 DIAGNOSIS — R2689 Other abnormalities of gait and mobility: Secondary | ICD-10-CM | POA: Diagnosis not present

## 2022-01-20 DIAGNOSIS — M5136 Other intervertebral disc degeneration, lumbar region: Secondary | ICD-10-CM

## 2022-01-20 DIAGNOSIS — M48061 Spinal stenosis, lumbar region without neurogenic claudication: Secondary | ICD-10-CM | POA: Diagnosis not present

## 2022-01-20 DIAGNOSIS — M542 Cervicalgia: Secondary | ICD-10-CM

## 2022-01-20 DIAGNOSIS — R200 Anesthesia of skin: Secondary | ICD-10-CM | POA: Diagnosis not present

## 2022-01-20 NOTE — Patient Instructions (Signed)
It was so nice to see you today, I am sorry that you are hurting so much.   Your previous lower back imaging showed wear and tear in your back (arthritis).   I want to get updated imaging of your back to look into things further. I have ordered an MRI of your back along with xrays.   I also want to get an MRI of your neck to look into the numbness in your hands and your balance.   We will get these studies approved by your insurance and then call you to schedule them at [facility].   I will see you back to review the results.   Please do not hesitate to call if you have any questions or concerns. You can also message me in [place].   If you have not heard back about any of the tests/procedures in the next week, please call the office so we can help you get these things scheduled.   Geronimo Boot PA-C [phone]

## 2022-02-02 ENCOUNTER — Ambulatory Visit
Admission: RE | Admit: 2022-02-02 | Discharge: 2022-02-02 | Disposition: A | Discharge status: Home / Self Care | Payer: Medicare Other | Source: Ambulatory Visit | Attending: Orthopedic Surgery | Admitting: Orthopedic Surgery

## 2022-02-02 DIAGNOSIS — M5136 Other intervertebral disc degeneration, lumbar region: Secondary | ICD-10-CM | POA: Insufficient documentation

## 2022-02-02 DIAGNOSIS — M48061 Spinal stenosis, lumbar region without neurogenic claudication: Secondary | ICD-10-CM | POA: Insufficient documentation

## 2022-02-02 DIAGNOSIS — R2689 Other abnormalities of gait and mobility: Secondary | ICD-10-CM

## 2022-02-02 DIAGNOSIS — M542 Cervicalgia: Secondary | ICD-10-CM | POA: Insufficient documentation

## 2022-02-02 DIAGNOSIS — M5416 Radiculopathy, lumbar region: Secondary | ICD-10-CM | POA: Insufficient documentation

## 2022-02-02 DIAGNOSIS — R202 Paresthesia of skin: Secondary | ICD-10-CM | POA: Diagnosis not present

## 2022-02-02 DIAGNOSIS — M545 Low back pain, unspecified: Secondary | ICD-10-CM | POA: Diagnosis not present

## 2022-02-02 DIAGNOSIS — R200 Anesthesia of skin: Secondary | ICD-10-CM | POA: Insufficient documentation

## 2022-02-02 DIAGNOSIS — M4802 Spinal stenosis, cervical region: Secondary | ICD-10-CM | POA: Diagnosis not present

## 2022-02-07 NOTE — Progress Notes (Signed)
Referring Physician:  Venia Carbon, MD [address] [place],  [place] [address]  Primary Physician:  Venia Carbon, MD  History of Present Illness: History of HTN, chronic venous insufficiency, IBS, DM with polyneuropathy.   Last seen by me on 01/20/22 for constant LBP with occasional posterior left leg pain to his foot x 1 year. He also notes increased back pain when standing/walking- his legs feel heavy. He has weakness in both legs.    He has known lumbar spondylosis L3-S1, mild central stenosis L3-L4, moderate DDD L4-L5 from MRI in 2021. Previous lumbar xrays in 2020 also showed retrolisthesis L3-L4.    He also has numbness in both hands. He has difficulty with buttons. He drops things. He notes some balance issues. He has intermittent neck pain. No arm pain.    MRI of cervical and lumbar was ordered along with lumbar xrays with flexion/extension.   He is here to review his imaging.  Primary complaint is pain/weakness in legs with prolonged standing and walking. He also has constant LBP with intermittent posterior left leg pain to his foot. No numbness or tingling in his legs.   He continues with some numbness in hands. Has trouble with buttons. No neck or arm pain.   His wife has pulmonary HTN and he is her caregiver.   No long term improvement with previous injections.   He does not smoke.   No bowel or bladder incontinence.   Conservative measures:  Physical therapy: none  Multimodal medical therapy including regular antiinflammatories: aleve prn  Injections:  diagnostic right L3 selective nerve root block with local anesthetic only for surgical planning on 08/06/20 at [facility] 2.   Right L3-L4 and right L4-L5 TF ESI on 01/24/20 Alba Destine) 3.   Right L3-L4 and right L4-L5 TF ESI on 01/01/20 Alba Destine)  Past Surgery: no spinal surgery.   The symptoms are causing a significant impact on the patient's life.   Review of Systems:  A 10 point review of systems  is negative, except for the pertinent positives and negatives detailed in the HPI.  Past Medical History: Past Medical History:  Diagnosis Date   Depression ~2010   situational and now better    Diabetes mellitus with polyneuropathy (HCC)    GERD (gastroesophageal reflux disease)    Hypertension    IBS (irritable bowel syndrome)    Osteoarthritis of multiple joints     Past Surgical History: Past Surgical History:  Procedure Laterality Date   COLONOSCOPY WITH PROPOFOL N/A 07/18/2017   Procedure: COLONOSCOPY WITH PROPOFOL;  Surgeon: Lollie Sails, MD;  Location: [facility] END[facility]COPY;  Service: Endoscopy;  Laterality: N/A;   CYST EXCISION  1952   left leg   KNEE ARTHROSCOPY Left 2011   REPLACEMENT TOTAL KNEE Left    TOTAL HIP ARTHROPLASTY Right     Allergies: Allergies as of 02/08/2022 - Review Complete 01/20/2022  Allergen Reaction Noted   Paxil [paroxetine hcl]  04/18/2017   Shellfish allergy Swelling 04/18/2017   Latex Rash 04/18/2017    Medications: Outpatient Encounter Medications as of 02/08/2022  Medication Sig   ALPRAZolam (XANAX) 0.25 MG tablet Take 1 tablet (0.25 mg total) by mouth 2 (two) times daily as needed for anxiety.   amLODipine (NORVASC) 5 MG tablet TAKE 1 TABLET BY MOUTH EVERY DAY   amoxicillin (AMOXIL) 500 MG capsule Take all 4 tablets by mouth 1 hour before dental procedure   atorvastatin (LIPITOR) 10 MG tablet TAKE 1 TABLET(10 MG) BY  MOUTH 1 TIME A WEEK   diphenoxylate-atropine (LOMOTIL) 2.5-0.025 MG tablet Take 1 tablet by mouth daily as needed for diarrhea or loose stools.   DULoxetine (CYMBALTA) 30 MG capsule TAKE 1 CAPSULE(30 MG) BY MOUTH DAILY   glipiZIDE (GLUCOTROL XL) 5 MG 24 hr tablet TAKE 1 TABLET BY MOUTH EVERY DAY WITH BREAKFAST   glucose blood (ONETOUCH ULTRA) test strip Use to check blood sugar once a day. Dx Code E11.42   lisinopril-hydrochlorothiazide (ZESTORETIC) 20-12.5 MG tablet TAKE 2 TABLETS BY MOUTH EVERY DAY   metFORMIN  (GLUCOPHAGE-XR) 750 MG 24 hr tablet TAKE 1 TABLET BY MOUTH DAILY WITH SUPPER   mometasone (ELOCON) 0.1 % ointment Apply topically 2 (two) times daily.   Multiple Vitamins-Minerals (MULTIVITAMIN MEN PO) Take by mouth.   OneTouch Delica Lancets 56Y MISC 1 each by Does not apply route daily. Dx Code E11.42   triamcinolone cream (KENALOG) 0.1 % APPLY TOPICALLY TO THE AFFECTED AREA TWICE DAILY AS NEEDED   No facility-administered encounter medications on file as of 02/08/2022.    Social History: Social History   Tobacco Use   Smoking status: Never   Smokeless tobacco: Never  Vaping Use   Vaping Use: Never used  Substance Use Topics   Alcohol use: No   Drug use: No    Family Medical History: Family History  Problem Relation Age of Onset   Arthritis Mother    Hypertension Mother    Dementia Mother    Heart attack Father    Heart disease Father    Cancer Father        lymphoma    Physical Examination: There were no vitals filed for this visit.    Awake, alert, oriented to person, place, and time.  Speech is clear and fluent. Fund of knowledge is appropriate.   Cranial Nerves: Pupils equal round and reactive to light.   No abnormal lesions on exposed skin.   Strength: Side Biceps Triceps Deltoid Interossei Grip Wrist Ext. Wrist Flex.  R '5 5 5 5 5 5 5  '$ L '5 5 5 5 5 5 5   '$ Side Iliopsoas Quads Hamstring PF DF EHL  R '5 5 5 5 5 5  '$ L '5 5 5 5 5 5   '$ Reflexes are 2+ and symmetric at the biceps, triceps, brachioradialis, patella and achilles.   Hoffman's is absent.  Clonus is not present.   Bilateral upper and lower extremity sensation is intact to light touch.     He has slow hunched forward gait.    Medical Decision Making  Imaging: Cervical MRI dated 02/02/22:  FINDINGS: Alignment: Physiologic.   Vertebrae: No fracture, evidence of discitis, or bone lesion.   Cord: Normal signal and morphology.   Posterior Fossa, vertebral arteries, paraspinal tissues: Negative.    Disc levels:   C2-3: Degenerative facet spurring asymmetric to the right where there is ankylosis. No impingement   C3-4: Degenerative facet spurring that is bulky on the right where there is ankylosis. Asymmetric right uncovertebral ridging. At least moderate right foraminal impingement   C4-5: Disc narrowing with endplate and uncovertebral ridging. Mild bilateral facet spurring. Mild bilateral foraminal narrowing   C5-6: Disc narrowing with endplate and uncovertebral ridging eccentric to the right where there is advanced foraminal stenosis. Moderate left foraminal stenosis. A disc osteophyte complex partially effaces the ventral subarachnoid space   C6-7: Disc narrowing and bulging with endplate ridging and uncovertebral spurring eccentric to the right where there is foraminal impingement. Patent canal and  left foramen   C7-T1:Degenerative facet spurring which is bulky on the left. Mild anterolisthesis.   IMPRESSION: 1. Generalized cervical spine degeneration with mild C7-T1 anterolisthesis. 2. Notably bulky facet spurring with ankylosis towards the right at C2-3 and C3-4. 3. Foraminal impingement on the right at C3-4, C5-6, and C6-7. 4. Moderate foraminal narrowing on the left at C5-6. 5. Up to mild spinal stenosis at C5-6.     Electronically Signed   By: Jorje Guild M.D.   On: 02/03/2022 13:15   Lumbar MRI dated 02/02/22:  FINDINGS: Segmentation:  Standard.   Alignment:  Mild lower lumbar dextrocurvature.   Vertebrae: No fracture, evidence of discitis, or bone lesion. New discogenic endplate edema at N0-2   Conus medullaris and cauda equina: Conus extends to the T12-L1 level. Conus and cauda equina appear normal.   Paraspinal and other soft tissues: Negative for perispinal mass or inflammation. Atrophy of intrinsic back muscles.   Disc levels:   T12- L1: Disc height loss and ventral spondylitic spurring   L1-L2: Disc height loss and ventral  spondylitic spurring   L2-L3: Disc height loss with ventral spurring and mild foraminal disc bulging   L3-L4: Disc collapse with endplate degeneration and bulging/ridging eccentric towards the right. Degenerative facet spurring on both sides. Right more than left foraminal impingement, progressed on the left. Moderate spinal stenosis that is also progressed. New degenerative endplate edema at this level.   L4-L5: Disc collapse with endplate ridging and facet spurring bilaterally. Moderate bilateral foraminal stenosis that is stable   L5-S1:Disc collapse with endplate degeneration and discogenic edema eccentric towards the left. Bilateral degenerative facet spurring. Moderate left foraminal narrowing.   IMPRESSION: 1. Advanced degeneration at L3-4 and below. Dominant change since 2021 is progression at L3-4 with new discogenic edema eccentric to the right. 2. L3-4 right more than left foraminal impingement and moderate spinal stenosis. 3. L4-5 bilateral and L5-S1 left moderate foraminal narrowing.     Electronically Signed   By: Jorje Guild M.D.   On: 02/03/2022 13:20   Lumbar xrays dated 02/02/22:  FINDINGS: There is no evidence of lumbar spine fracture. Alignment is normal. Multilevel degenerative joint changes with narrowed joint space, osteophyte formation facet joint sclerosis are identified most prominently involving the lower lumbar spine.   IMPRESSION: Degenerative joint changes of lumbar spine.     Electronically Signed   By: Abelardo Diesel M.D.   On: 02/04/2022 08:48    I have personally reviewed the images and agree with the above interpretation.   Assessment and Plan: [facility] is a pleasant 76 y.o. male with primary complaint of pain/weakness in legs with prolonged standing and walking. He also has constant LBP with intermittent posterior left leg pain to his foot. No numbness or tingling in his legs.   He has known retrolisthesis L3-L4 with  diffuse lumbar spondylosis and DDD. MRI shows moderate central stenosis L3-L4 with bilateral foraminal stenosis at L3-L5 and left foraminal stenosis L5-S1.   Leg symptoms are likely from central stenosis L3-L4. Left leg pain likely from foraminal stenosis L5-S1. LBP is likely multifactorial.   He continues with some numbness in hands. Has trouble with buttons. No neck or arm pain.   He has known cervical spondylosis with multilevel foraminal stenosis C3-C7. He has mild central stenosis C5-C6.   Treatment options discussed with patient and following plan made:   - PT for lumbar spine. Orders to [facility] in [place]. He will call to schedule.  - Referral  back to [facility] Alba Destine) to discuss possible lumbar injections (?bilateral L3-L4 TF ESI).  - Will monitor numbness in bilateral hands for now. May consider EMG at some point.  - He will follow up with me in 2 months for recheck. If no improvement with above, may need to consider surgery options. He is caregiver for his wife.   BP was 166/72. No symptoms. He check at home and generally runs 130/70s. He will recheck when he gets home and call PCP with any issues.   I spent a total of 20 minutes in face-to-face and non-face-to-face activities related to this patient's care today.  Thank you for involving me in the care of this patient.   Geronimo Boot PA-C Dept. of Neurosurgery

## 2022-02-08 ENCOUNTER — Ambulatory Visit (INDEPENDENT_AMBULATORY_CARE_PROVIDER_SITE_OTHER): Payer: Medicare Other | Admitting: Orthopedic Surgery

## 2022-02-08 ENCOUNTER — Other Ambulatory Visit: Payer: Self-pay | Admitting: Internal Medicine

## 2022-02-08 ENCOUNTER — Encounter: Payer: Self-pay | Admitting: Orthopedic Surgery

## 2022-02-08 VITALS — BP 165/72 | HR 88 | Wt 219.6 lb

## 2022-02-08 DIAGNOSIS — M5416 Radiculopathy, lumbar region: Secondary | ICD-10-CM | POA: Diagnosis not present

## 2022-02-08 DIAGNOSIS — M5136 Other intervertebral disc degeneration, lumbar region: Secondary | ICD-10-CM

## 2022-02-08 DIAGNOSIS — R200 Anesthesia of skin: Secondary | ICD-10-CM

## 2022-02-08 DIAGNOSIS — M47812 Spondylosis without myelopathy or radiculopathy, cervical region: Secondary | ICD-10-CM

## 2022-02-08 DIAGNOSIS — M48061 Spinal stenosis, lumbar region without neurogenic claudication: Secondary | ICD-10-CM | POA: Diagnosis not present

## 2022-02-08 NOTE — Patient Instructions (Signed)
It was so nice to see you today, I am sorry that you are hurting so much.   Your neck MRI showed wear and tear (arthritis). Can consider a nerve test if numbness persists.   Your lower back MRI showed wear and tear (arthritis) with spinal stenosis (pressure on the spinal cord).   I sent physical therapy orders to [facility] in [place]. Call them at [phone] to schedule.   I want you to see Dr. Alba Destine at [facility] to discuss lumbar injections. They should call you to schedule or you can call [phone].   I will see you back in 2 months. Please do not hesitate to call if you have any questions or concerns. You can also message me in [place].   If you have not heard back about any of the tests/procedures in the next week, please call the office so we can help you get these things scheduled.   Geronimo Boot PA-C [phone]

## 2022-04-05 ENCOUNTER — Other Ambulatory Visit: Payer: Self-pay | Admitting: Internal Medicine

## 2022-04-14 ENCOUNTER — Ambulatory Visit: Payer: BLUE CROSS/BLUE SHIELD | Admitting: Orthopedic Surgery

## 2022-04-22 ENCOUNTER — Other Ambulatory Visit: Payer: Self-pay | Admitting: Internal Medicine

## 2022-04-29 ENCOUNTER — Other Ambulatory Visit: Payer: Self-pay | Admitting: Internal Medicine

## 2022-05-04 NOTE — Progress Notes (Deleted)
Referring Physician:  No referring provider defined for this encounter.  Primary Physician:  Venia Carbon, MD  History of Present Illness: History of HTN, chronic venous insufficiency, IBS, DM with polyneuropathy.   He is caregiver for his wife.   Last seen by me on 02/08/22 for pain/weakness in legs with prolonged standing and walking. He also has constant LBP with intermittent posterior left leg pain to his foot. No numbness or tingling in his legs.    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 MRI shows moderate central stenosis L3-L4 with bilateral foraminal stenosis at L3-L5 and left foraminal stenosis L5-S1.   Also with numbness in hands. He has known cervical spondylosis with multilevel foraminal stenosis C3-C7. He has mild central stenosis C5-C6.    Sent to PT at last visit (did not go). He was also to get back in with Dr. Alba Destine to discuss lumbar injections (did not go).   He is here for follow up.     ***blood pressure better?      He also notes increased back pain when standing/walking- his legs feel heavy. He has weakness in both legs.    He has known lumbar spondylosis L3-S1, mild central stenosis L3-L4, moderate DDD L4-L5 from MRI in 2021. Previous lumbar xrays in 2020 also showed retrolisthesis L3-L4.    He also has numbness in both hands. He has difficulty with buttons. He drops things. He notes some balance issues. He has intermittent neck pain. No arm pain.    MRI of cervical and lumbar was ordered along with lumbar xrays with flexion/extension.   He is here to review his imaging.  Primary complaint is pain/weakness in legs with prolonged standing and walking. He also has constant LBP with intermittent posterior left leg pain to his foot. No numbness or tingling in his legs.   He continues with some numbness in hands. Has trouble with buttons. No neck or arm pain.   His wife has pulmonary HTN and he is her caregiver.    No long term improvement with previous injections.   He does not smoke.   No bowel or bladder incontinence.   Conservative measures:  Physical therapy: none  Multimodal medical therapy including regular antiinflammatories: aleve prn  Injections:  diagnostic right L3 selective nerve root block with local anesthetic only for surgical planning on 08/06/20 at [facility] 2.   Right L3-L4 and right L4-L5 TF ESI on 01/24/20 Alba Destine) 3.   Right L3-L4 and right L4-L5 TF ESI on 01/01/20 Alba Destine)  Past Surgery: no spinal surgery.   The symptoms are causing a significant impact on the patient's life.   Review of Systems:  A 10 point review of systems is negative, except for the pertinent positives and negatives detailed in the HPI.  Past Medical History: Past Medical History:  Diagnosis Date   Depression ~2010   situational and now better    Diabetes mellitus with polyneuropathy (HCC)    GERD (gastroesophageal reflux disease)    Hypertension    IBS (irritable bowel syndrome)    Osteoarthritis of multiple joints     Past Surgical History: Past Surgical History:  Procedure Laterality Date   COLONOSCOPY WITH PROPOFOL N/A 07/18/2017   Procedure: COLONOSCOPY WITH PROPOFOL;  Surgeon: Lollie Sails, MD;  Location: [facility] ENDOSCOPY;  Service: Endoscopy;  Laterality: N/A;   CYST EXCISION  1952   left leg   KNEE ARTHROSCOPY Left 2011   REPLACEMENT TOTAL KNEE Left  TOTAL HIP ARTHROPLASTY Right     Allergies: Allergies as of 05/11/2022 - Review Complete 02/08/2022  Allergen Reaction Noted   Paxil [paroxetine hcl]  04/18/2017   Shellfish allergy Swelling 04/18/2017   Latex Rash 04/18/2017    Medications: Outpatient Encounter Medications as of 05/11/2022  Medication Sig   ALPRAZolam (XANAX) 0.25 MG tablet Take 1 tablet (0.25 mg total) by mouth 2 (two) times daily as needed for anxiety.   amLODipine (NORVASC) 5 MG tablet TAKE 1 TABLET BY MOUTH EVERY DAY   amoxicillin (AMOXIL) 500 MG  capsule Take all 4 tablets by mouth 1 hour before dental procedure   atorvastatin (LIPITOR) 10 MG tablet TAKE 1 TABLET(10 MG) BY MOUTH 1 TIME A WEEK   diphenoxylate-atropine (LOMOTIL) 2.5-0.025 MG tablet TAKE 1 TABLET BY MOUTH EVERY DAY AS NEEDED FOR DIARRHEA OR LOOSE STOOLS   DULoxetine (CYMBALTA) 30 MG capsule TAKE 1 CAPSULE(30 MG) BY MOUTH DAILY   glipiZIDE (GLUCOTROL XL) 5 MG 24 hr tablet TAKE 1 TABLET BY MOUTH EVERY DAY WITH BREAKFAST   glucose blood (ONETOUCH ULTRA) test strip Use to check blood sugar once a day. Dx Code E11.42   lisinopril-hydrochlorothiazide (ZESTORETIC) 20-12.5 MG tablet TAKE 2 TABLETS BY MOUTH EVERY DAY   metFORMIN (GLUCOPHAGE-XR) 750 MG 24 hr tablet TAKE 1 TABLET BY MOUTH DAILY WITH SUPPER   mometasone (ELOCON) 0.1 % ointment Apply topically 2 (two) times daily.   Multiple Vitamins-Minerals (MULTIVITAMIN MEN PO) Take by mouth.   OneTouch Delica Lancets 99991111 MISC 1 each by Does not apply route daily. Dx Code E11.42   triamcinolone cream (KENALOG) 0.1 % APPLY TOPICALLY TO THE AFFECTED AREA TWICE DAILY AS NEEDED   No facility-administered encounter medications on file as of 05/11/2022.    Social History: Social History   Tobacco Use   Smoking status: Never   Smokeless tobacco: Never  Vaping Use   Vaping Use: Never used  Substance Use Topics   Alcohol use: No   Drug use: No    Family Medical History: Family History  Problem Relation Age of Onset   Arthritis Mother    Hypertension Mother    Dementia Mother    Heart attack Father    Heart disease Father    Cancer Father        lymphoma    Physical Examination: There were no vitals filed for this visit.    Awake, alert, oriented to person, place, and time.  Speech is clear and fluent. Fund of knowledge is appropriate.   Cranial Nerves: Pupils equal round and reactive to light.   No abnormal lesions on exposed skin.   Strength: Side Biceps Triceps Deltoid Interossei Grip Wrist Ext. Wrist Flex.  R '5  5 5 5 5 5 5  '$ L '5 5 5 5 5 5 5   '$ Side Iliopsoas Quads Hamstring PF DF EHL  R '5 5 5 5 5 5  '$ L '5 5 5 5 5 5   '$ Reflexes are 2+ and symmetric at the biceps, triceps, brachioradialis, patella and achilles.   Hoffman's is absent.  Clonus is not present.   Bilateral upper and lower extremity sensation is intact to light touch.     He has slow hunched forward gait.    Medical Decision Making  Imaging: None   Assessment and Plan: Mr. Obie is a pleasant 77 y.o. male with primary complaint of pain/weakness in legs with prolonged standing and walking. He also has constant LBP with intermittent posterior left leg pain to  his foot. No numbness or tingling in his legs.   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 MRI shows moderate central stenosis L3-L4 with bilateral foraminal stenosis at L3-L5 and left foraminal stenosis L5-S1.   Leg symptoms are likely from central stenosis L3-L4. Left leg pain likely from foraminal stenosis L5-S1. LBP is likely multifactorial.   He continues with some numbness in hands. Has trouble with buttons. No neck or arm pain.   He has known cervical spondylosis with multilevel foraminal stenosis C3-C7. He has mild central stenosis C5-C6.   Treatment options discussed with patient and following plan made:   - PT for lumbar spine. Orders to [facility] in [place]. He will call to schedule.  - Referral back to PMR Alba Destine) to discuss possible lumbar injections (?bilateral L3-L4 TF ESI).  - Will monitor numbness in bilateral hands for now. May consider EMG at some point.  - He will follow up with me in 2 months for recheck. If no improvement with above, may need to consider surgery options. He is caregiver for his wife.   BP was 166/72. No symptoms. He check at home and generally runs 130/70s. He will recheck when he gets home and call PCP with any issues.   I spent a total of 20 minutes in face-to-face and non-face-to-face  activities related to this patient's care today.  Thank you for involving me in the care of this patient.   Geronimo Boot PA-C Dept. of Neurosurgery

## 2022-05-09 ENCOUNTER — Other Ambulatory Visit: Payer: Self-pay | Admitting: Internal Medicine

## 2022-05-11 ENCOUNTER — Ambulatory Visit: Payer: Medicare Other | Admitting: Orthopedic Surgery

## 2022-05-29 ENCOUNTER — Other Ambulatory Visit: Payer: Self-pay | Admitting: Internal Medicine

## 2022-05-29 DIAGNOSIS — F39 Unspecified mood [affective] disorder: Secondary | ICD-10-CM

## 2022-06-27 NOTE — Progress Notes (Deleted)
Referring Physician:  No referring provider defined for this encounter.  Primary Physician:  Karie Schwalbe, MD  History of Present Illness: History of HTN, chronic venous insufficiency, IBS, DM with polyneuropathy.   Last seen by me on 02/08/22 for back pain and numbness in his hands.   He had pain/weakness in legs with prolonged standing and walking. He also has constant LBP with intermittent posterior left leg pain to his foot.   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 MRI shows moderate central stenosis L3-L4 with bilateral foraminal stenosis at L3-L5 and left foraminal stenosis L5-S1.   Leg symptoms are likely from central stenosis L3-L4. Left leg pain likely from foraminal stenosis L5-S1. LBP is likely multifactorial.   He also had some numbness in hands. He has known cervical spondylosis with multilevel foraminal stenosis C3-C7. He has mild central stenosis C5-C6.   PT was recommended for lumbar spine and he was sent to [facility]. He was also referred to Dr. [name] to consider injections. Discussed he may need EMG of bilateral arms at some point. He is caregiver for his wife.   He is here for follow up.   Looks like he has not gone to PT or seen Dr. [name].***        Last seen by me on 01/20/22 for constant LBP with occasional posterior left leg pain to his foot x 1 year. He also notes increased back pain when standing/walking- his legs feel heavy. He has weakness in both legs.    He has known lumbar spondylosis L3-S1, mild central stenosis L3-L4, moderate DDD L4-L5 from MRI in 2021. Previous lumbar xrays in 2020 also showed retrolisthesis L3-L4.    He also has numbness in both hands. He has difficulty with buttons. He drops things. He notes some balance issues. He has intermittent neck pain. No arm pain.    MRI of cervical and lumbar was ordered along with lumbar xrays with flexion/extension.   He is here to review his  imaging.  Primary complaint is pain/weakness in legs with prolonged standing and walking. He also has constant LBP with intermittent posterior left leg pain to his foot. No numbness or tingling in his legs.   He continues with some numbness in hands. Has trouble with buttons. No neck or arm pain.   His wife has pulmonary HTN and he is her caregiver.   No long term improvement with previous injections.   He does not smoke.   No bowel or bladder incontinence.   Conservative measures:  Physical therapy: none  Multimodal medical therapy including regular antiinflammatories: aleve prn  Injections:  diagnostic right L3 selective nerve root block with local anesthetic only for surgical planning on 08/06/20 at [facility] 2.   Right L3-L4 and right L4-L5 TF ESI on 01/24/20 [name]) 3.   Right L3-L4 and right L4-L5 TF ESI on 01/01/20 [name])  Past Surgery: no spinal surgery.   The symptoms are causing a significant impact on the patient's life.   Review of Systems:  A 10 point review of systems is negative, except for the pertinent positives and negatives detailed in the HPI.  Past Medical History: Past Medical History:  Diagnosis Date   Depression ~2010   situational and now better    Diabetes mellitus with polyneuropathy (HCC)    GERD (gastroesophageal reflux disease)    Hypertension    IBS (irritable bowel syndrome)    Osteoarthritis of multiple joints     Past Surgical  History: Past Surgical History:  Procedure Laterality Date   COLONOSCOPY WITH PROPOFOL N/A 07/18/2017   Procedure: COLONOSCOPY WITH PROPOFOL;  Surgeon: Christena Deem, MD;  Location: [facility] ENDOSCOPY;  Service: Endoscopy;  Laterality: N/A;   CYST EXCISION  1952   left leg   KNEE ARTHROSCOPY Left 2011   REPLACEMENT TOTAL KNEE Left    TOTAL HIP ARTHROPLASTY Right     Allergies: Allergies as of 06/29/2022 - Review Complete 02/08/2022  Allergen Reaction Noted   Paxil [paroxetine hcl]  04/18/2017   Shellfish  allergy Swelling 04/18/2017   Latex Rash 04/18/2017    Medications: Outpatient Encounter Medications as of 06/29/2022  Medication Sig   ALPRAZolam (XANAX) 0.25 MG tablet Take 1 tablet (0.25 mg total) by mouth 2 (two) times daily as needed for anxiety.   amLODipine (NORVASC) 5 MG tablet TAKE 1 TABLET BY MOUTH EVERY DAY   amoxicillin (AMOXIL) 500 MG capsule Take all 4 tablets by mouth 1 hour before dental procedure   atorvastatin (LIPITOR) 10 MG tablet TAKE 1 TABLET(10 MG) BY MOUTH 1 TIME A WEEK   diphenoxylate-atropine (LOMOTIL) 2.5-0.025 MG tablet TAKE 1 TABLET BY MOUTH EVERY DAY AS NEEDED FOR DIARRHEA OR LOOSE STOOLS   DULoxetine (CYMBALTA) 30 MG capsule TAKE 1 CAPSULE(30 MG) BY MOUTH DAILY   glipiZIDE (GLUCOTROL XL) 5 MG 24 hr tablet TAKE 1 TABLET BY MOUTH EVERY DAY WITH BREAKFAST   glucose blood (ONETOUCH ULTRA) test strip Use to check blood sugar once a day. Dx Code E11.42   lisinopril-hydrochlorothiazide (ZESTORETIC) 20-12.5 MG tablet TAKE 2 TABLETS BY MOUTH EVERY DAY   metFORMIN (GLUCOPHAGE-XR) 750 MG 24 hr tablet TAKE 1 TABLET BY MOUTH DAILY WITH SUPPER   mometasone (ELOCON) 0.1 % ointment Apply topically 2 (two) times daily.   Multiple Vitamins-Minerals (MULTIVITAMIN MEN PO) Take by mouth.   OneTouch Delica Lancets 30G MISC 1 each by Does not apply route daily. Dx Code E11.42   triamcinolone cream (KENALOG) 0.1 % APPLY TOPICALLY TO THE AFFECTED AREA TWICE DAILY AS NEEDED   No facility-administered encounter medications on file as of 06/29/2022.    Social History: Social History   Tobacco Use   Smoking status: Never   Smokeless tobacco: Never  Vaping Use   Vaping Use: Never used  Substance Use Topics   Alcohol use: No   Drug use: No    Family Medical History: Family History  Problem Relation Age of Onset   Arthritis Mother    Hypertension Mother    Dementia Mother    Heart attack Father    Heart disease Father    Cancer Father        lymphoma    Physical  Examination: There were no vitals filed for this visit.    Awake, alert, oriented to person, place, and time.  Speech is clear and fluent. Fund of knowledge is appropriate.   Cranial Nerves: Pupils equal round and reactive to light.   No abnormal lesions on exposed skin.   Strength: Side Biceps Triceps Deltoid Interossei Grip Wrist Ext. Wrist Flex.  R L Side Iliopsoas Quads Hamstring PF DF EHL  R L Reflexes are 2+ and symmetric at the biceps, triceps, brachioradialis, patella and achilles.   Hoffman's is absent.  Clonus is not present.   Bilateral upper and lower extremity  sensation is intact to light touch.     He has slow hunched forward gait.    Medical Decision Making  Imaging: None    Assessment and Plan: Mr. Fauth is a pleasant 77 y.o. male with primary complaint of pain/weakness in legs with prolonged standing and walking. He also has constant LBP with intermittent posterior left leg pain to his foot. No numbness or tingling in his legs.   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 MRI shows moderate central stenosis L3-L4 with bilateral foraminal stenosis at L3-L5 and left foraminal stenosis L5-S1.   Leg symptoms are likely from central stenosis L3-L4. Left leg pain likely from foraminal stenosis L5-S1. LBP is likely multifactorial.   He continues with some numbness in hands. Has trouble with buttons. No neck or arm pain.   He has known cervical spondylosis with multilevel foraminal stenosis C3-C7. He has mild central stenosis C5-C6.   Treatment options discussed with patient and following plan made:   - PT for lumbar spine. Orders to [facility] in [place]. He will call to schedule.  - Referral back to PMR [name]) to discuss possible lumbar injections (?bilateral L3-L4 TF ESI).  - Will monitor numbness in bilateral hands for now. May consider EMG at some point.  -  He will follow up with me in 2 months for recheck. If no improvement with above, may need to consider surgery options. He is caregiver for his wife.   BP was 166/72. No symptoms. He check at home and generally runs 130/70s. He will recheck when he gets home and call PCP with any issues.   I spent a total of 20 minutes in face-to-face and non-face-to-face activities related to this patient's care today.  Thank you for involving me in the care of this patient.   Drake Leach PA-C Dept. of Neurosurgery

## 2022-06-28 ENCOUNTER — Telehealth: Payer: Self-pay | Admitting: Orthopedic Surgery

## 2022-06-28 DIAGNOSIS — M5136 Other intervertebral disc degeneration, lumbar region: Secondary | ICD-10-CM

## 2022-06-28 DIAGNOSIS — M5416 Radiculopathy, lumbar region: Secondary | ICD-10-CM

## 2022-06-28 DIAGNOSIS — M48061 Spinal stenosis, lumbar region without neurogenic claudication: Secondary | ICD-10-CM

## 2022-06-28 NOTE — Telephone Encounter (Signed)
I tried to call him to tell him I was sorry about his wife, but no answer on either number.   Referral put in for PT.   It's up to him whether he wants to try PT first and then get injection or do both. I think it depends on how bad he is hurting. If pain is bad, I would do the injection and PT.   Please make him a follow up with me in 6-8 weeks.

## 2022-06-28 NOTE — Telephone Encounter (Signed)
-----   Message from Frank Vega sent at 06/28/2022 10:51 AM EDT ----- His wife passed away last month. He is ready to start his treatment again. Do you recommend he do PT first and then if needed he will gets injections or should he do both treatments at the same time? He would like a referral to [facility] PT. His appt for tomorrow has been cancelled. ----- Message ----- From: Jana Half Sent: 06/27/2022   2:26 PM EDT To: Drake Leach, PA-C; Patrisia Castaneda  Lvm to call back ----- Message ----- From: Drake Leach, PA-C Sent: 06/27/2022   2:17 PM EDT To: [facility] Admin  He has appt on Wednesday for f/u after PT and seeing [facility].   I don't see where he has started PT or seen Dr. [name]. Does he need help getting these things scheduled? I recommend he do them, and then push out his visit with me in 4-6 weeks.   I know he is caregiver of his wife, has he not been able to schedule things as he has no time?   Please let me know.

## 2022-06-29 ENCOUNTER — Ambulatory Visit: Payer: Medicare Other | Admitting: Orthopedic Surgery

## 2022-07-13 DIAGNOSIS — M2569 Stiffness of other specified joint, not elsewhere classified: Secondary | ICD-10-CM | POA: Diagnosis not present

## 2022-07-13 DIAGNOSIS — M625A2 Muscle wasting and atrophy, not elsewhere classified, back, lumbosacral: Secondary | ICD-10-CM | POA: Diagnosis not present

## 2022-07-13 DIAGNOSIS — R293 Abnormal posture: Secondary | ICD-10-CM | POA: Diagnosis not present

## 2022-07-13 DIAGNOSIS — M5451 Vertebrogenic low back pain: Secondary | ICD-10-CM | POA: Diagnosis not present

## 2022-07-15 DIAGNOSIS — M2569 Stiffness of other specified joint, not elsewhere classified: Secondary | ICD-10-CM | POA: Diagnosis not present

## 2022-07-15 DIAGNOSIS — R293 Abnormal posture: Secondary | ICD-10-CM | POA: Diagnosis not present

## 2022-07-15 DIAGNOSIS — M5451 Vertebrogenic low back pain: Secondary | ICD-10-CM | POA: Diagnosis not present

## 2022-07-15 DIAGNOSIS — M625A2 Muscle wasting and atrophy, not elsewhere classified, back, lumbosacral: Secondary | ICD-10-CM | POA: Diagnosis not present

## 2022-07-18 DIAGNOSIS — M625A2 Muscle wasting and atrophy, not elsewhere classified, back, lumbosacral: Secondary | ICD-10-CM | POA: Diagnosis not present

## 2022-07-18 DIAGNOSIS — M5451 Vertebrogenic low back pain: Secondary | ICD-10-CM | POA: Diagnosis not present

## 2022-07-18 DIAGNOSIS — R293 Abnormal posture: Secondary | ICD-10-CM | POA: Diagnosis not present

## 2022-07-18 DIAGNOSIS — M2569 Stiffness of other specified joint, not elsewhere classified: Secondary | ICD-10-CM | POA: Diagnosis not present

## 2022-07-26 DIAGNOSIS — M5451 Vertebrogenic low back pain: Secondary | ICD-10-CM | POA: Diagnosis not present

## 2022-07-26 DIAGNOSIS — M2569 Stiffness of other specified joint, not elsewhere classified: Secondary | ICD-10-CM | POA: Diagnosis not present

## 2022-07-26 DIAGNOSIS — R293 Abnormal posture: Secondary | ICD-10-CM | POA: Diagnosis not present

## 2022-07-26 DIAGNOSIS — M625A2 Muscle wasting and atrophy, not elsewhere classified, back, lumbosacral: Secondary | ICD-10-CM | POA: Diagnosis not present

## 2022-08-02 DIAGNOSIS — M2569 Stiffness of other specified joint, not elsewhere classified: Secondary | ICD-10-CM | POA: Diagnosis not present

## 2022-08-02 DIAGNOSIS — M625A2 Muscle wasting and atrophy, not elsewhere classified, back, lumbosacral: Secondary | ICD-10-CM | POA: Diagnosis not present

## 2022-08-02 DIAGNOSIS — M5451 Vertebrogenic low back pain: Secondary | ICD-10-CM | POA: Diagnosis not present

## 2022-08-02 DIAGNOSIS — R293 Abnormal posture: Secondary | ICD-10-CM | POA: Diagnosis not present

## 2022-08-04 DIAGNOSIS — M625A2 Muscle wasting and atrophy, not elsewhere classified, back, lumbosacral: Secondary | ICD-10-CM | POA: Diagnosis not present

## 2022-08-04 DIAGNOSIS — M2569 Stiffness of other specified joint, not elsewhere classified: Secondary | ICD-10-CM | POA: Diagnosis not present

## 2022-08-04 DIAGNOSIS — M5451 Vertebrogenic low back pain: Secondary | ICD-10-CM | POA: Diagnosis not present

## 2022-08-04 DIAGNOSIS — R293 Abnormal posture: Secondary | ICD-10-CM | POA: Diagnosis not present

## 2022-08-08 DIAGNOSIS — M625A2 Muscle wasting and atrophy, not elsewhere classified, back, lumbosacral: Secondary | ICD-10-CM | POA: Diagnosis not present

## 2022-08-08 DIAGNOSIS — R293 Abnormal posture: Secondary | ICD-10-CM | POA: Diagnosis not present

## 2022-08-08 DIAGNOSIS — M5451 Vertebrogenic low back pain: Secondary | ICD-10-CM | POA: Diagnosis not present

## 2022-08-08 DIAGNOSIS — M2569 Stiffness of other specified joint, not elsewhere classified: Secondary | ICD-10-CM | POA: Diagnosis not present

## 2022-08-10 ENCOUNTER — Ambulatory Visit: Payer: Medicare Other | Admitting: Orthopedic Surgery

## 2022-08-10 DIAGNOSIS — M625A2 Muscle wasting and atrophy, not elsewhere classified, back, lumbosacral: Secondary | ICD-10-CM | POA: Diagnosis not present

## 2022-08-10 DIAGNOSIS — R293 Abnormal posture: Secondary | ICD-10-CM | POA: Diagnosis not present

## 2022-08-10 DIAGNOSIS — M2569 Stiffness of other specified joint, not elsewhere classified: Secondary | ICD-10-CM | POA: Diagnosis not present

## 2022-08-10 DIAGNOSIS — M5451 Vertebrogenic low back pain: Secondary | ICD-10-CM | POA: Diagnosis not present

## 2022-08-15 DIAGNOSIS — M5451 Vertebrogenic low back pain: Secondary | ICD-10-CM | POA: Diagnosis not present

## 2022-08-15 DIAGNOSIS — R293 Abnormal posture: Secondary | ICD-10-CM | POA: Diagnosis not present

## 2022-08-15 DIAGNOSIS — M2569 Stiffness of other specified joint, not elsewhere classified: Secondary | ICD-10-CM | POA: Diagnosis not present

## 2022-08-15 DIAGNOSIS — M625A2 Muscle wasting and atrophy, not elsewhere classified, back, lumbosacral: Secondary | ICD-10-CM | POA: Diagnosis not present

## 2022-08-17 DIAGNOSIS — M625A2 Muscle wasting and atrophy, not elsewhere classified, back, lumbosacral: Secondary | ICD-10-CM | POA: Diagnosis not present

## 2022-08-17 DIAGNOSIS — M2569 Stiffness of other specified joint, not elsewhere classified: Secondary | ICD-10-CM | POA: Diagnosis not present

## 2022-08-17 DIAGNOSIS — R293 Abnormal posture: Secondary | ICD-10-CM | POA: Diagnosis not present

## 2022-08-17 DIAGNOSIS — M5451 Vertebrogenic low back pain: Secondary | ICD-10-CM | POA: Diagnosis not present

## 2022-08-18 ENCOUNTER — Ambulatory Visit: Payer: Medicare Other | Admitting: Orthopedic Surgery

## 2022-08-24 DIAGNOSIS — M625A2 Muscle wasting and atrophy, not elsewhere classified, back, lumbosacral: Secondary | ICD-10-CM | POA: Diagnosis not present

## 2022-08-24 DIAGNOSIS — R293 Abnormal posture: Secondary | ICD-10-CM | POA: Diagnosis not present

## 2022-08-24 DIAGNOSIS — M5451 Vertebrogenic low back pain: Secondary | ICD-10-CM | POA: Diagnosis not present

## 2022-08-24 DIAGNOSIS — M2569 Stiffness of other specified joint, not elsewhere classified: Secondary | ICD-10-CM | POA: Diagnosis not present

## 2022-08-26 DIAGNOSIS — M2569 Stiffness of other specified joint, not elsewhere classified: Secondary | ICD-10-CM | POA: Diagnosis not present

## 2022-08-26 DIAGNOSIS — M625A2 Muscle wasting and atrophy, not elsewhere classified, back, lumbosacral: Secondary | ICD-10-CM | POA: Diagnosis not present

## 2022-08-26 DIAGNOSIS — M5451 Vertebrogenic low back pain: Secondary | ICD-10-CM | POA: Diagnosis not present

## 2022-08-26 DIAGNOSIS — R293 Abnormal posture: Secondary | ICD-10-CM | POA: Diagnosis not present

## 2022-08-26 NOTE — Progress Notes (Unsigned)
Referring Physician:  No referring provider defined for this encounter.  Primary Physician:  Karie Schwalbe, MD  History of Present Illness: History of HTN, chronic venous insufficiency, IBS, DM with polyneuropathy.   Last seen by me on 06/28/22 for pain/weakness in legs with prolonged standing and walking. He also has constant LBP with intermittent posterior left leg pain to his foot.   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 MRI shows moderate central stenosis L3-L4 with bilateral foraminal stenosis at L3-L5 and left foraminal stenosis L5-S1.    Leg symptoms are likely from central stenosis L3-L4. Left leg pain likely from foraminal stenosis L5-S1. LBP is likely multifactorial.    He continues with some numbness in hands. No neck or arm pain.    He has known cervical spondylosis with multilevel foraminal stenosis C3-C7. He has mild central stenosis C5-C6.   His wife has passed away since our last visit.   He was sent to PT for his back. He wanted to try PT prior to considering injections. He is here for follow up.   He thinks PT is helping with his back. He has intermittent LBP that is tolerable. He notes some intermittent pain/weakness in legs after grocery shopping but this goes away once he gets in the car. Legs can still feel heavy at times.   He still has constant numbness in hands. No neck or arm pain.   No long term improvement with previous injections.   He does not smoke.   No bowel or bladder incontinence.   Conservative measures:  Physical therapy: none  Multimodal medical therapy including regular antiinflammatories: aleve prn  Injections:  diagnostic right L3 selective nerve root block with local anesthetic only for surgical planning on 08/06/20 at [facility] 2.   Right L3-L4 and right L4-L5 TF ESI on 01/24/20 [name]) 3.   Right L3-L4 and right L4-L5 TF ESI on 01/01/20 [name])  Past Surgery: no spinal surgery.   The  symptoms are causing a significant impact on the patient's life.   Review of Systems:  A 10 point review of systems is negative, except for the pertinent positives and negatives detailed in the HPI.  Past Medical History: Past Medical History:  Diagnosis Date   Depression ~2010   situational and now better    Diabetes mellitus with polyneuropathy (HCC)    GERD (gastroesophageal reflux disease)    Hypertension    IBS (irritable bowel syndrome)    Osteoarthritis of multiple joints     Past Surgical History: Past Surgical History:  Procedure Laterality Date   COLONOSCOPY WITH PROPOFOL N/A 07/18/2017   Procedure: COLONOSCOPY WITH PROPOFOL;  Surgeon: Christena Deem, MD;  Location: [facility] ENDOSCOPY;  Service: Endoscopy;  Laterality: N/A;   CYST EXCISION  1952   left leg   KNEE ARTHROSCOPY Left 2011   REPLACEMENT TOTAL KNEE Left    TOTAL HIP ARTHROPLASTY Right     Allergies: Allergies as of 08/30/2022 - Review Complete 02/08/2022  Allergen Reaction Noted   Paxil [paroxetine hcl]  04/18/2017   Shellfish allergy Swelling 04/18/2017   Latex Rash 04/18/2017    Medications: Outpatient Encounter Medications as of 08/30/2022  Medication Sig   ALPRAZolam (XANAX) 0.25 MG tablet Take 1 tablet (0.25 mg total) by mouth 2 (two) times daily as needed for anxiety.   amLODipine (NORVASC) 5 MG tablet TAKE 1 TABLET BY MOUTH EVERY DAY   amoxicillin (AMOXIL) 500 MG capsule Take all 4 tablets by mouth  1 hour before dental procedure   atorvastatin (LIPITOR) 10 MG tablet TAKE 1 TABLET(10 MG) BY MOUTH 1 TIME A WEEK   diphenoxylate-atropine (LOMOTIL) 2.5-0.025 MG tablet TAKE 1 TABLET BY MOUTH EVERY DAY AS NEEDED FOR DIARRHEA OR LOOSE STOOLS   DULoxetine (CYMBALTA) 30 MG capsule TAKE 1 CAPSULE(30 MG) BY MOUTH DAILY   glipiZIDE (GLUCOTROL XL) 5 MG 24 hr tablet TAKE 1 TABLET BY MOUTH EVERY DAY WITH BREAKFAST   glucose blood (ONETOUCH ULTRA) test strip Use to check blood sugar once a day. Dx Code E11.42    lisinopril-hydrochlorothiazide (ZESTORETIC) 20-12.5 MG tablet TAKE 2 TABLETS BY MOUTH EVERY DAY   metFORMIN (GLUCOPHAGE-XR) 750 MG 24 hr tablet TAKE 1 TABLET BY MOUTH DAILY WITH SUPPER   mometasone (ELOCON) 0.1 % ointment Apply topically 2 (two) times daily.   Multiple Vitamins-Minerals (MULTIVITAMIN MEN PO) Take by mouth.   OneTouch Delica Lancets 30G MISC 1 each by Does not apply route daily. Dx Code E11.42   triamcinolone cream (KENALOG) 0.1 % APPLY TOPICALLY TO THE AFFECTED AREA TWICE DAILY AS NEEDED   No facility-administered encounter medications on file as of 08/30/2022.    Social History: Social History   Tobacco Use   Smoking status: Never   Smokeless tobacco: Never  Vaping Use   Vaping Use: Never used  Substance Use Topics   Alcohol use: No   Drug use: No    Family Medical History: Family History  Problem Relation Age of Onset   Arthritis Mother    Hypertension Mother    Dementia Mother    Heart attack Father    Heart disease Father    Cancer Father        lymphoma    Physical Examination: There were no vitals filed for this visit.    Awake, alert, oriented to person, place, and time.  Speech is clear and fluent. Fund of knowledge is appropriate.   Cranial Nerves: Pupils equal round and reactive to light.   No abnormal lesions on exposed skin.   Strength: Side Biceps Triceps Deltoid Interossei Grip Wrist Ext. Wrist Flex.  R 5 5 5 5 5 5 5   L 5 5 5 5 5 5 5    Side Iliopsoas Quads Hamstring PF DF EHL  R 5 5 5 5 5 5   L 5 5 5 5 5 5    Reflexes are 2+ and symmetric at the biceps, triceps, brachioradialis, patella and achilles.   Hoffman's is absent.  Clonus is not present.   Bilateral upper and lower extremity sensation is intact to light touch.     He has slow gait.   Medical Decision Making  Imaging: none  Assessment and Plan: Mr. Mcelhannon is a pleasant 77 y.o. male has seen improvement in his back and leg pain with PT.  He has intermittent LBP that  is tolerable. He notes some intermittent pain/weakness in legs after grocery shopping but this goes away once he gets in the car. Legs can still feel heavy at times.   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 MRI shows moderate central stenosis L3-L4 with bilateral foraminal stenosis at L3-L5 and left foraminal stenosis L5-S1.   Leg symptoms are likely from central stenosis L3-L4. LBP is likely multifactorial.   He continues with constant numbness in hands. No neck or arm pain.   He has known cervical spondylosis with multilevel foraminal stenosis C3-C7. He has mild central stenosis C5-C6.   Treatment options discussed with patient and  following plan made:   - Continue with current PT for his back. If he gets worse, may have him see Dr. Myer Haff to consider surgery options. Previous injections did not help.  - EMG of bilateral upper extremities to evaluate numbness/tingling in both hands. Orders to neurology at [facility].  - Referral to ortho at [facility]) for right shoulder pain.  - Will schedule f/u with me to review his EMG once I have the results back.   I spent a total of 15 minutes in face-to-face and non-face-to-face activities related to this patient's care today including review of outside records, review of imaging, review of symptoms, physical exam, discussion of differential diagnosis, discussion of treatment options, and documentation.    Drake Leach PA-C Dept. of Neurosurgery

## 2022-08-29 ENCOUNTER — Encounter: Payer: Medicare Other | Admitting: Internal Medicine

## 2022-08-30 ENCOUNTER — Encounter: Payer: Self-pay | Admitting: Orthopedic Surgery

## 2022-08-30 ENCOUNTER — Ambulatory Visit (INDEPENDENT_AMBULATORY_CARE_PROVIDER_SITE_OTHER): Payer: Medicare Other | Admitting: Orthopedic Surgery

## 2022-08-30 ENCOUNTER — Telehealth: Payer: Self-pay | Admitting: Orthopedic Surgery

## 2022-08-30 VITALS — BP 128/70 | Ht 67.0 in | Wt 221.0 lb

## 2022-08-30 DIAGNOSIS — M5116 Intervertebral disc disorders with radiculopathy, lumbar region: Secondary | ICD-10-CM | POA: Diagnosis not present

## 2022-08-30 DIAGNOSIS — M5136 Other intervertebral disc degeneration, lumbar region: Secondary | ICD-10-CM

## 2022-08-30 DIAGNOSIS — R200 Anesthesia of skin: Secondary | ICD-10-CM | POA: Diagnosis not present

## 2022-08-30 DIAGNOSIS — M4316 Spondylolisthesis, lumbar region: Secondary | ICD-10-CM | POA: Diagnosis not present

## 2022-08-30 DIAGNOSIS — M25511 Pain in right shoulder: Secondary | ICD-10-CM | POA: Diagnosis not present

## 2022-08-30 DIAGNOSIS — R202 Paresthesia of skin: Secondary | ICD-10-CM | POA: Diagnosis not present

## 2022-08-30 DIAGNOSIS — M48061 Spinal stenosis, lumbar region without neurogenic claudication: Secondary | ICD-10-CM

## 2022-08-30 DIAGNOSIS — M5416 Radiculopathy, lumbar region: Secondary | ICD-10-CM

## 2022-08-30 NOTE — Patient Instructions (Addendum)
It was so nice to see you today, I am glad you are feeling a little better.   Continue with PT for your lower back. If things don't continue to improve or get worse, we may have you see Dr. Myer Haff.   I want you to see neurology at the [facility] clinic for an EMG (nerve conduction test) to look into things further. I have ordered this and they will call you to schedule. If you don't hear from them, you can call them at [phone].   I want you to see ortho at the [facility] clinic for evaluation of your right shoulder. They should call you to schedule an appointment or you can call them at [phone].   Once I have the nerve test results back, we will call you with a follow to review them.   Please do not hesitate to call if you have any questions or concerns. You can also message me in MyChart.   Take care,   Drake Leach PA-C [phone]

## 2022-08-30 NOTE — Telephone Encounter (Signed)
EMG ordered at [facility] neurology. He wanted it done in [place].   He needs to see me back after EMG if we can make note of when it is scheduled.   Thanks!

## 2022-08-31 DIAGNOSIS — M625A2 Muscle wasting and atrophy, not elsewhere classified, back, lumbosacral: Secondary | ICD-10-CM | POA: Diagnosis not present

## 2022-08-31 DIAGNOSIS — M2569 Stiffness of other specified joint, not elsewhere classified: Secondary | ICD-10-CM | POA: Diagnosis not present

## 2022-08-31 DIAGNOSIS — M5451 Vertebrogenic low back pain: Secondary | ICD-10-CM | POA: Diagnosis not present

## 2022-08-31 DIAGNOSIS — R293 Abnormal posture: Secondary | ICD-10-CM | POA: Diagnosis not present

## 2022-08-31 NOTE — Telephone Encounter (Signed)
Referral faxed to [facility]. 

## 2022-09-01 DIAGNOSIS — D225 Melanocytic nevi of trunk: Secondary | ICD-10-CM | POA: Diagnosis not present

## 2022-09-01 DIAGNOSIS — L570 Actinic keratosis: Secondary | ICD-10-CM | POA: Diagnosis not present

## 2022-09-01 DIAGNOSIS — Z85828 Personal history of other malignant neoplasm of skin: Secondary | ICD-10-CM | POA: Diagnosis not present

## 2022-09-01 DIAGNOSIS — D2261 Melanocytic nevi of right upper limb, including shoulder: Secondary | ICD-10-CM | POA: Diagnosis not present

## 2022-09-01 DIAGNOSIS — D2262 Melanocytic nevi of left upper limb, including shoulder: Secondary | ICD-10-CM | POA: Diagnosis not present

## 2022-09-01 DIAGNOSIS — L258 Unspecified contact dermatitis due to other agents: Secondary | ICD-10-CM | POA: Diagnosis not present

## 2022-09-01 DIAGNOSIS — L309 Dermatitis, unspecified: Secondary | ICD-10-CM | POA: Diagnosis not present

## 2022-09-01 DIAGNOSIS — R202 Paresthesia of skin: Secondary | ICD-10-CM | POA: Diagnosis not present

## 2022-09-01 DIAGNOSIS — D2272 Melanocytic nevi of left lower limb, including hip: Secondary | ICD-10-CM | POA: Diagnosis not present

## 2022-09-05 ENCOUNTER — Other Ambulatory Visit: Payer: Self-pay | Admitting: Internal Medicine

## 2022-09-05 DIAGNOSIS — F39 Unspecified mood [affective] disorder: Secondary | ICD-10-CM

## 2022-09-07 NOTE — Telephone Encounter (Signed)
No appt has been scheduled yet.

## 2022-09-13 DIAGNOSIS — M2569 Stiffness of other specified joint, not elsewhere classified: Secondary | ICD-10-CM | POA: Diagnosis not present

## 2022-09-13 DIAGNOSIS — R293 Abnormal posture: Secondary | ICD-10-CM | POA: Diagnosis not present

## 2022-09-13 DIAGNOSIS — M625A2 Muscle wasting and atrophy, not elsewhere classified, back, lumbosacral: Secondary | ICD-10-CM | POA: Diagnosis not present

## 2022-09-13 DIAGNOSIS — M5451 Vertebrogenic low back pain: Secondary | ICD-10-CM | POA: Diagnosis not present

## 2022-09-14 NOTE — Telephone Encounter (Signed)
Per [facility], patient was contacted but no return call yet.

## 2022-09-19 DIAGNOSIS — M19011 Primary osteoarthritis, right shoulder: Secondary | ICD-10-CM | POA: Diagnosis not present

## 2022-09-19 DIAGNOSIS — G8929 Other chronic pain: Secondary | ICD-10-CM | POA: Diagnosis not present

## 2022-09-19 DIAGNOSIS — M25511 Pain in right shoulder: Secondary | ICD-10-CM | POA: Diagnosis not present

## 2022-09-19 NOTE — Telephone Encounter (Signed)
10/12/2022 EMG

## 2022-09-21 DIAGNOSIS — M5451 Vertebrogenic low back pain: Secondary | ICD-10-CM | POA: Diagnosis not present

## 2022-09-21 DIAGNOSIS — R293 Abnormal posture: Secondary | ICD-10-CM | POA: Diagnosis not present

## 2022-09-21 DIAGNOSIS — M625A2 Muscle wasting and atrophy, not elsewhere classified, back, lumbosacral: Secondary | ICD-10-CM | POA: Diagnosis not present

## 2022-09-21 DIAGNOSIS — M2569 Stiffness of other specified joint, not elsewhere classified: Secondary | ICD-10-CM | POA: Diagnosis not present

## 2022-09-23 DIAGNOSIS — M625A2 Muscle wasting and atrophy, not elsewhere classified, back, lumbosacral: Secondary | ICD-10-CM | POA: Diagnosis not present

## 2022-09-23 DIAGNOSIS — M2569 Stiffness of other specified joint, not elsewhere classified: Secondary | ICD-10-CM | POA: Diagnosis not present

## 2022-09-23 DIAGNOSIS — R293 Abnormal posture: Secondary | ICD-10-CM | POA: Diagnosis not present

## 2022-09-23 DIAGNOSIS — M5451 Vertebrogenic low back pain: Secondary | ICD-10-CM | POA: Diagnosis not present

## 2022-09-26 ENCOUNTER — Ambulatory Visit (INDEPENDENT_AMBULATORY_CARE_PROVIDER_SITE_OTHER): Payer: Medicare Other | Admitting: Internal Medicine

## 2022-09-26 ENCOUNTER — Encounter: Payer: Self-pay | Admitting: Internal Medicine

## 2022-09-26 VITALS — BP 120/84 | HR 72 | Temp 97.8°F | Ht 67.5 in | Wt 221.0 lb

## 2022-09-26 DIAGNOSIS — Z0000 Encounter for general adult medical examination without abnormal findings: Secondary | ICD-10-CM

## 2022-09-26 DIAGNOSIS — E1142 Type 2 diabetes mellitus with diabetic polyneuropathy: Secondary | ICD-10-CM

## 2022-09-26 DIAGNOSIS — I10 Essential (primary) hypertension: Secondary | ICD-10-CM

## 2022-09-26 DIAGNOSIS — M5416 Radiculopathy, lumbar region: Secondary | ICD-10-CM | POA: Diagnosis not present

## 2022-09-26 DIAGNOSIS — F39 Unspecified mood [affective] disorder: Secondary | ICD-10-CM

## 2022-09-26 DIAGNOSIS — Z7984 Long term (current) use of oral hypoglycemic drugs: Secondary | ICD-10-CM | POA: Diagnosis not present

## 2022-09-26 LAB — RENAL FUNCTION PANEL
Albumin: 4.3 g/dL (ref 3.5–5.2)
BUN: 11 mg/dL (ref 6–23)
CO2: 28 mEq/L (ref 19–32)
Calcium: 9.6 mg/dL (ref 8.4–10.5)
Chloride: 100 mEq/L (ref 96–112)
Creatinine, Ser: 0.81 mg/dL (ref 0.40–1.50)
GFR: 85.2 mL/min (ref >60.00)
Glucose, Bld: 128 mg/dL — ABNORMAL HIGH (ref 70–99)
Phosphorus: 4.1 mg/dL (ref 2.3–4.6)
Potassium: 4 mEq/L (ref 3.5–5.1)
Sodium: 139 mEq/L (ref 135–145)

## 2022-09-26 LAB — CBC
HCT: 41.7 % (ref 39.0–52.0)
Hemoglobin: 13.8 g/dL (ref 13.0–17.0)
MCHC: 33 g/dL (ref 30.0–36.0)
MCV: 96.1 fl (ref 78.0–100.0)
Platelets: 315 10*3/uL (ref 150.0–400.0)
RBC: 4.34 Mil/uL (ref 4.22–5.81)
RDW: 13.4 % (ref 11.5–15.5)
WBC: 10.8 10*3/uL — ABNORMAL HIGH (ref 4.0–10.5)

## 2022-09-26 LAB — HEPATIC FUNCTION PANEL
ALT: 16 U/L (ref 0–53)
AST: 17 U/L (ref 0–37)
Albumin: 4.3 g/dL (ref 3.5–5.2)
Alkaline Phosphatase: 95 U/L (ref 39–117)
Bilirubin, Direct: 0.1 mg/dL (ref 0.0–0.3)
Total Bilirubin: 1 mg/dL (ref 0.2–1.2)
Total Protein: 7.2 g/dL (ref 6.0–8.3)

## 2022-09-26 LAB — HEMOGLOBIN A1C: Hgb A1c MFr Bld: 7.2 % — ABNORMAL HIGH (ref 4.6–6.5)

## 2022-09-26 LAB — LIPID PANEL
Cholesterol: 205 mg/dL — ABNORMAL HIGH (ref 0–200)
HDL: 37.4 mg/dL — ABNORMAL LOW (ref >39.00)
Total CHOL/HDL Ratio: 5
Triglycerides: 452 mg/dL — ABNORMAL HIGH (ref 0.0–149.0)

## 2022-09-26 LAB — MICROALBUMIN / CREATININE URINE RATIO
Creatinine,U: 141.2 mg/dL
Microalb Creat Ratio: 0.7 mg/g (ref 0.0–30.0)
Microalb, Ur: 0.9 mg/dL (ref 0.0–1.9)

## 2022-09-26 LAB — HM DIABETES FOOT EXAM

## 2022-09-26 LAB — LDL CHOLESTEROL, DIRECT: Direct LDL: 97 mg/dL

## 2022-09-26 NOTE — Assessment & Plan Note (Signed)
Ongoing PT and neurosurgery/ortho evals

## 2022-09-26 NOTE — Progress Notes (Signed)
Vision Screening   Right eye Left eye Both eyes  Without correction     With correction 20/15 20/15 20/20   Hearing Screening - Comments:: Has hearing aids. Not wearing them today

## 2022-09-26 NOTE — Progress Notes (Signed)
Subjective:    Patient ID: Frank Vega, male    DOB: 1945/06/04, 77 y.o.   MRN: [id number]  HPI Here for Medicare wellness visit and follow up of chronic health conditions Reviewed advanced directives Reviewed other doctors----Dr [facility], Dr Zada Girt, Dr Dingledein--ophthal, Dr Horatio Pel, Ms Doy Mince (Dr Margaretha Sheffield No surgery or hospitalizations in the past year No set exercise--just doing therapy Vision is okay Hearing aides --do help ([employer]) No alcohol or tobacco No falls Instrumental ADLs No sig memory issues  Still dealing with wife's death in 05/15/2022 Some bad days still--when thinks about her Usually doesn't last more than a day Tries to stay busy---"I miss the human touch" Is involved with church---and another social group playing dominoes all day  Still having back issues Doing PT twice a week--helping some Not ready to go back to golf  Checks sugars once a week  Usually in 120's or less Numbness in fingers--not in feet much  No chest pain  No palpitations No SOB No dizziness or syncope  Current Outpatient Medications on File Prior to Visit  Medication Sig Dispense Refill   amLODipine (NORVASC) 5 MG tablet TAKE 1 TABLET BY MOUTH EVERY DAY 90 tablet 3   amoxicillin (AMOXIL) 500 MG capsule Take all 4 tablets by mouth 1 hour before dental procedure 4 capsule 2   atorvastatin (LIPITOR) 10 MG tablet TAKE 1 TABLET(10 MG) BY MOUTH 1 TIME A WEEK 13 tablet 3   diphenoxylate-atropine (LOMOTIL) 2.5-0.025 MG tablet TAKE 1 TABLET BY MOUTH EVERY DAY AS NEEDED FOR DIARRHEA OR LOOSE STOOLS 30 tablet 0   DULoxetine (CYMBALTA) 30 MG capsule TAKE 1 CAPSULE(30 MG) BY MOUTH DAILY 90 capsule 0   glipiZIDE (GLUCOTROL XL) 5 MG 24 hr tablet TAKE 1 TABLET BY MOUTH EVERY DAY WITH BREAKFAST 90 tablet 3   glucose blood (ONETOUCH ULTRA) test strip Use to check blood sugar once a day. Dx Code E11.42 100 each 4   lisinopril-hydrochlorothiazide (ZESTORETIC) 20-12.5  MG tablet TAKE 2 TABLETS BY MOUTH EVERY DAY 180 tablet 3   metFORMIN (GLUCOPHAGE-XR) 750 MG 24 hr tablet TAKE 1 TABLET BY MOUTH DAILY WITH SUPPER 90 tablet 3   mometasone (ELOCON) 0.1 % ointment Apply topically 2 (two) times daily.     Multiple Vitamins-Minerals (MULTIVITAMIN MEN PO) Take by mouth.     OneTouch Delica Lancets 30G MISC 1 each by Does not apply route daily. Dx Code E11.42 100 each 3   triamcinolone cream (KENALOG) 0.1 % APPLY TOPICALLY TO THE AFFECTED AREA TWICE DAILY AS NEEDED 45 g 1   No current facility-administered medications on file prior to visit.    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HCC)    GERD (gastroesophageal reflux disease)    Hypertension    IBS (irritable bowel syndrome)    Osteoarthritis of multiple joints     Past Surgical History:  Procedure Laterality Date   COLONOSCOPY WITH PROPOFOL N/A 07/18/2017   Procedure: COLONOSCOPY WITH PROPOFOL;  Surgeon: Christena Deem, MD;  Location: [facility] ENDOSCOPY;  Service: Endoscopy;  Laterality: N/A;   CYST EXCISION  1952   left leg   KNEE ARTHROSCOPY Left 2011   REPLACEMENT TOTAL KNEE Left    TOTAL HIP ARTHROPLASTY Right  Family History  Problem Relation Age of Onset   Arthritis Mother    Hypertension Mother    Dementia Mother    Heart attack Father    Heart disease Father    Cancer Father        lymphoma    Social History   Socioeconomic History   Marital status: Widowed    Spouse name: Not on file   Number of children: 1   Years of education: Not on file   Highest education level: Not on file  Occupational History   Occupation: Multimedia programmer    Comment: Retired  Tobacco Use   Smoking status: Never    Passive exposure: Past   Smokeless tobacco:  Never  Vaping Use   Vaping status: Never Used  Substance and Sexual Activity   Alcohol use: No   Drug use: No   Sexual activity: Never  Other Topics Concern   Not on file  Social History Narrative   1 daughter--local   Wife died Jun 21, 2022      No living will   Daughter should make health care decisions   Requests DNR--done 7.22/24   Not sure about tube feeds   Social Determinants of Health   Financial Resource Strain: Low Risk  (06/14/2019)   Overall Financial Resource Strain (CARDIA)    Difficulty of Paying Living Expenses: Not very hard  Food Insecurity: Not on file  Transportation Needs: Not on file  Physical Activity: Not on file  Stress: Not on file  Social Connections: Not on file  Intimate Partner Violence: Not on file    Review of Systems Appetite is okay Weight up slightly Sleeps fair--nocturia x 1 Wears seat belt Partial on bottom and full on top. Does see dentist No heartburn or dysphagia (but has to be careful not to eat too fast) Bowels move fine--no blood Voids okay in general No suspicious skin lesions    Objective:   Physical Exam Constitutional:      Appearance: Normal appearance.  HENT:     Mouth/Throat:     Pharynx: No oropharyngeal exudate or posterior oropharyngeal erythema.  Eyes:     Conjunctiva/sclera: Conjunctivae normal.     Pupils: Pupils are equal, round, and reactive to light.  Cardiovascular:     Rate and Rhythm: Normal rate and regular rhythm.     Pulses: Normal pulses.     Heart sounds: No murmur heard.    No gallop.  Pulmonary:     Effort: Pulmonary effort is normal.     Breath sounds: Normal breath sounds. No wheezing or rales.  Abdominal:     Palpations: Abdomen is soft.     Tenderness: There is no abdominal tenderness.  Musculoskeletal:     Cervical back: Neck supple.     Comments: Trace ankle edema  Lymphadenopathy:     Cervical: No cervical adenopathy.  Skin:    Findings: No lesion or rash.     Comments: No foot  lesions  Neurological:     General: No focal deficit present.     Mental Status: He is alert and oriented to person, place, and time.     Comments: Word naming 9/1 minute Recall 3/3 Fairly normal sensation in feet  Psychiatric:        Mood and Affect: Mood normal.        Behavior: Behavior normal.            Assessment & Plan:

## 2022-09-26 NOTE — Assessment & Plan Note (Signed)
Seems to have good control on glipizide 5mg  daily On atorvatstatin also

## 2022-09-26 NOTE — Assessment & Plan Note (Addendum)
I have personally reviewed the Medicare Annual Wellness questionnaire and have noted 1. The patient's medical and social history 2. Their use of alcohol, tobacco or illicit drugs 3. Their current medications and supplements 4. The patient's functional ability including ADL's, fall risks, home safety risks and hearing or visual             impairment. 5. Diet and physical activities 6. Evidence for depression or mood disorders  The patients weight, height, BMI and visual acuity have been recorded in the chart I have made referrals, counseling and provided education to the patient based review of the above and I have provided the pt with a written personalized care plan for preventive services.  I have provided you with a copy of your personalized plan for preventive services. Please take the time to review along with your updated medication list.  Getting last screening colonoscopy soon Done with PSA testing Trying to increase activity Discussed updated COVID/flu vaccines this fall--and RSV Can get shingrix at the pharmacy

## 2022-09-26 NOTE — Assessment & Plan Note (Signed)
Struggling with grief now Mild dysthymia otherwise Is on the duloxetine 30mg  (seems to help with neuropathy also)

## 2022-09-26 NOTE — Assessment & Plan Note (Signed)
BP Readings from Last 3 Encounters:  09/26/22 120/84  08/30/22 128/70  02/08/22 (!) 165/72   Good control on lisinopril/hydrochlorothiazide 40/25 Will check labs

## 2022-09-28 DIAGNOSIS — E119 Type 2 diabetes mellitus without complications: Secondary | ICD-10-CM | POA: Diagnosis not present

## 2022-09-28 DIAGNOSIS — H2513 Age-related nuclear cataract, bilateral: Secondary | ICD-10-CM | POA: Diagnosis not present

## 2022-09-28 LAB — HM DIABETES EYE EXAM

## 2022-09-30 DIAGNOSIS — M2569 Stiffness of other specified joint, not elsewhere classified: Secondary | ICD-10-CM | POA: Diagnosis not present

## 2022-09-30 DIAGNOSIS — M625A2 Muscle wasting and atrophy, not elsewhere classified, back, lumbosacral: Secondary | ICD-10-CM | POA: Diagnosis not present

## 2022-09-30 DIAGNOSIS — R293 Abnormal posture: Secondary | ICD-10-CM | POA: Diagnosis not present

## 2022-09-30 DIAGNOSIS — M5451 Vertebrogenic low back pain: Secondary | ICD-10-CM | POA: Diagnosis not present

## 2022-10-03 DIAGNOSIS — M2569 Stiffness of other specified joint, not elsewhere classified: Secondary | ICD-10-CM | POA: Diagnosis not present

## 2022-10-03 DIAGNOSIS — M5451 Vertebrogenic low back pain: Secondary | ICD-10-CM | POA: Diagnosis not present

## 2022-10-03 DIAGNOSIS — R293 Abnormal posture: Secondary | ICD-10-CM | POA: Diagnosis not present

## 2022-10-03 DIAGNOSIS — M625A2 Muscle wasting and atrophy, not elsewhere classified, back, lumbosacral: Secondary | ICD-10-CM | POA: Diagnosis not present

## 2022-10-06 DIAGNOSIS — M5451 Vertebrogenic low back pain: Secondary | ICD-10-CM | POA: Diagnosis not present

## 2022-10-06 DIAGNOSIS — M2569 Stiffness of other specified joint, not elsewhere classified: Secondary | ICD-10-CM | POA: Diagnosis not present

## 2022-10-06 DIAGNOSIS — M625A2 Muscle wasting and atrophy, not elsewhere classified, back, lumbosacral: Secondary | ICD-10-CM | POA: Diagnosis not present

## 2022-10-06 DIAGNOSIS — R293 Abnormal posture: Secondary | ICD-10-CM | POA: Diagnosis not present

## 2022-10-10 ENCOUNTER — Telehealth: Payer: Self-pay | Admitting: Orthopedic Surgery

## 2022-10-10 NOTE — Telephone Encounter (Signed)
Patient states his EMG is scheduled for 10/12/2022. He said that he is doing better but Saturday his left sciatica started acting up. He is going to treat this at home and see you in a few weeks.

## 2022-10-10 NOTE — Telephone Encounter (Signed)
-----   Message from Frank Vega M sent at 10/07/2022  1:33 PM EDT ----- If he is worse and wants to discuss possible surgery for lumbar spine, then he can see Myer Haff.   If not, he can see me.   I ordered an EMG of his upper extremities. Looks like [facility] neurology called him and he has not called back. Please have him call if he still wants this done.   Thanks. ----- Message ----- From: Rockey Situ Sent: 10/07/2022   1:16 PM EDT To: Frank Leach, PA-C  Patient left a message on the office v/m. He has been discharged from PT. Should he see you or MD?

## 2022-10-12 DIAGNOSIS — R200 Anesthesia of skin: Secondary | ICD-10-CM | POA: Diagnosis not present

## 2022-10-24 NOTE — Telephone Encounter (Signed)
10/27/2022 with Kennyth Arnold

## 2022-10-24 NOTE — Telephone Encounter (Signed)
Appt with Stacy on 10/27/2022

## 2022-10-26 NOTE — Progress Notes (Unsigned)
Referring Physician:  No referring provider defined for this encounter.  Primary Physician:  Karie Schwalbe, MD  History of Present Illness: History of HTN, chronic venous insufficiency, IBS, DM with polyneuropathy.   Last seen by me on 08/30/22 for his lumbar spine and numbness in his hands.   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 MRI shows moderate central stenosis L3-L4 with bilateral foraminal stenosis at L3-L5 and left foraminal stenosis L5-S1.    Leg symptoms are likely from central stenosis L3-L4. LBP is likely multifactorial.    He continues with constant numbness in hands. No neck or arm pain.    He has known cervical spondylosis with multilevel foraminal stenosis C3-C7. He has mild central stenosis C5-C6.   He has been discharged from PT for his back. He saw ortho at [facility] for his right shoulder OA. He had EMG done and is here to review it.   He had flare up of some left leg pain a few weeks ago. This improved with hot/cold compresses. He has only occasional LBP that is tolerable. He continues to do his HEP from PT. He is not having any pain or weakness in his legs and his walking is better, but he still cannot walk as far as he would like.   He still has constant numbness in the tips of his fingers. No neck or arm pain. This is a little better and is tolerable.   No long term improvement with previous lumbar injections.   He does not smoke.   No bowel or bladder incontinence.   Conservative measures:  Physical therapy: He was discharged from lumbar PT on 10/06/22 after completing 19 sessions.  Multimodal medical therapy including regular antiinflammatories: aleve prn  Injections:  diagnostic right L3 selective nerve root block with local anesthetic only for surgical planning on 08/06/20 at [facility] 2.   Right L3-L4 and right L4-L5 TF ESI on 01/24/20 [name]) 3.   Right L3-L4 and right L4-L5 TF ESI on 01/01/20 [name])  Past Surgery:  no spinal surgery.   The symptoms are causing a significant impact on the patient's life.   Review of Systems:  A 10 point review of systems is negative, except for the pertinent positives and negatives detailed in the HPI.  Past Medical History: Past Medical History:  Diagnosis Date   Depression ~2010   situational and now better    Diabetes mellitus with polyneuropathy (HCC)    GERD (gastroesophageal reflux disease)    Hypertension    IBS (irritable bowel syndrome)    Osteoarthritis of multiple joints     Past Surgical History: Past Surgical History:  Procedure Laterality Date   COLONOSCOPY WITH PROPOFOL N/A 07/18/2017   Procedure: COLONOSCOPY WITH PROPOFOL;  Surgeon: Christena Deem, MD;  Location: [facility] ENDOSCOPY;  Service: Endoscopy;  Laterality: N/A;   CYST EXCISION  1952   left leg   KNEE ARTHROSCOPY Left 2011   REPLACEMENT TOTAL KNEE Left    TOTAL HIP ARTHROPLASTY Right     Allergies: Allergies as of 10/27/2022 - Review Complete 09/26/2022  Allergen Reaction Noted   Paxil [paroxetine hcl]  04/18/2017   Shellfish allergy Swelling 04/18/2017   Latex Rash 04/18/2017    Medications: Outpatient Encounter Medications as of 10/27/2022  Medication Sig   amLODipine (NORVASC) 5 MG tablet TAKE 1 TABLET BY MOUTH EVERY DAY   amoxicillin (AMOXIL) 500 MG capsule Take all 4 tablets by mouth 1 hour before dental procedure  atorvastatin (LIPITOR) 10 MG tablet TAKE 1 TABLET(10 MG) BY MOUTH 1 TIME A WEEK   diphenoxylate-atropine (LOMOTIL) 2.5-0.025 MG tablet TAKE 1 TABLET BY MOUTH EVERY DAY AS NEEDED FOR DIARRHEA OR LOOSE STOOLS   DULoxetine (CYMBALTA) 30 MG capsule TAKE 1 CAPSULE(30 MG) BY MOUTH DAILY   glipiZIDE (GLUCOTROL XL) 5 MG 24 hr tablet TAKE 1 TABLET BY MOUTH EVERY DAY WITH BREAKFAST   glucose blood (ONETOUCH ULTRA) test strip Use to check blood sugar once a day. Dx Code E11.42   lisinopril-hydrochlorothiazide (ZESTORETIC) 20-12.5 MG tablet TAKE 2 TABLETS BY MOUTH  EVERY DAY   metFORMIN (GLUCOPHAGE-XR) 750 MG 24 hr tablet TAKE 1 TABLET BY MOUTH DAILY WITH SUPPER   mometasone (ELOCON) 0.1 % ointment Apply topically 2 (two) times daily.   Multiple Vitamins-Minerals (MULTIVITAMIN MEN PO) Take by mouth.   OneTouch Delica Lancets 30G MISC 1 each by Does not apply route daily. Dx Code E11.42   triamcinolone cream (KENALOG) 0.1 % APPLY TOPICALLY TO THE AFFECTED AREA TWICE DAILY AS NEEDED   No facility-administered encounter medications on file as of 10/27/2022.    Social History: Social History   Tobacco Use   Smoking status: Never    Passive exposure: Past   Smokeless tobacco: Never  Vaping Use   Vaping status: Never Used  Substance Use Topics   Alcohol use: No   Drug use: No    Family Medical History: Family History  Problem Relation Age of Onset   Arthritis Mother    Hypertension Mother    Dementia Mother    Heart attack Father    Heart disease Father    Cancer Father        lymphoma    Physical Examination: There were no vitals filed for this visit.    Awake, alert, oriented to person, place, and time.  Speech is clear and fluent. Fund of knowledge is appropriate.   Cranial Nerves: Pupils equal round and reactive to light.   No abnormal lesions on exposed skin.   Strength: Side Biceps Triceps Deltoid Interossei Grip Wrist Ext. Wrist Flex.  R 5 5 5 5 5 5 5   L 5 5 5 5 5 5 5    Side Iliopsoas Quads Hamstring PF DF EHL  R 5 5 5 5 5 5   L 5 5 5 5 5 5    Bilateral upper and lower extremity sensation is intact to light touch.     He has slow gait.   Medical Decision Making  Imaging: EMG of bilateral upper and lower extremities dated 10/12/22:  Impression: This is an abnormal electrodiagnostic study consistent with a generalized sensorimotor polyneuropathy. 2) No electrodignostic evidence of active lumbosacral radiculopathy.   Thank you for the referral of this patient. It was our privilege to participate in care of your patient.  Feel free to contact us with any further questions.  _____________________________ Cristopher Peru, M.D.    Assessment and Plan: Mr. Bassani is a pleasant 77 y.o. male that had a flare up of some left leg pain a few weeks ago. This improved with hot/cold compresses. He has only occasional LBP that is tolerable. His walking is better, but he still cannot walk as far as he would like.   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 MRI shows moderate central stenosis L3-L4 with bilateral foraminal stenosis at L3-L5 and left foraminal stenosis L5-S1.   He still has constant numbness in the tips of his fingers. No neck or arm  pain. This is a little better and is tolerable.   He has known cervical spondylosis with multilevel foraminal stenosis C3-C7. He has mild central stenosis C5-C6. EMG showed a generalized sensorimotor polyneuropathy.   Treatment options discussed with patient and following plan made:   - He would like to follow up with Dr. Myer Haff to discuss any surgical options to help with leg symptoms and walking. He has been discharged from PT.  - Continue HEP from PT.  - Discussed EMG findings. Current symptoms tolerable. Refer back to neurology if he gets worse.  - He was told he may need a shoulder replacement. Has to schedule f/u with ortho.   I spent a total of 15 minutes in face-to-face and non-face-to-face activities related to this patient's care today including review of outside records, review of imaging, review of symptoms, physical exam, discussion of differential diagnosis, discussion of treatment options, and documentation.    Drake Leach PA-C Dept. of Neurosurgery

## 2022-10-27 ENCOUNTER — Ambulatory Visit (INDEPENDENT_AMBULATORY_CARE_PROVIDER_SITE_OTHER): Payer: Medicare Other | Admitting: Orthopedic Surgery

## 2022-10-27 ENCOUNTER — Encounter: Payer: Self-pay | Admitting: Orthopedic Surgery

## 2022-10-27 VITALS — BP 130/78 | Ht 67.5 in | Wt 221.0 lb

## 2022-10-27 DIAGNOSIS — M5416 Radiculopathy, lumbar region: Secondary | ICD-10-CM

## 2022-10-27 DIAGNOSIS — M4802 Spinal stenosis, cervical region: Secondary | ICD-10-CM

## 2022-10-27 DIAGNOSIS — M4302 Spondylolysis, cervical region: Secondary | ICD-10-CM

## 2022-10-27 DIAGNOSIS — M48061 Spinal stenosis, lumbar region without neurogenic claudication: Secondary | ICD-10-CM | POA: Diagnosis not present

## 2022-10-27 DIAGNOSIS — R202 Paresthesia of skin: Secondary | ICD-10-CM | POA: Diagnosis not present

## 2022-10-27 DIAGNOSIS — M5136 Other intervertebral disc degeneration, lumbar region: Secondary | ICD-10-CM | POA: Diagnosis not present

## 2022-10-27 DIAGNOSIS — M4316 Spondylolisthesis, lumbar region: Secondary | ICD-10-CM

## 2022-10-27 DIAGNOSIS — R200 Anesthesia of skin: Secondary | ICD-10-CM | POA: Diagnosis not present

## 2022-11-08 NOTE — H&P (View-Only) (Signed)
Referring Physician:  No referring provider defined for this encounter.  Primary Physician:  Karie Schwalbe, MD  History of Present Illness: 11/10/2022 Frank Vega is here today with a chief complaint of low back and left leg pain.  His left leg is bothering him the most.  He gets pain down the posterior aspect of his left leg from his buttock down to his foot and ankle.  He has been dealing with this for more than a year.  He has previously undergone physical therapy as well as injections over several different courses without longstanding improvement.  Standing and walking are difficult.  Nothing really helps.  He is now at the point of considering surgical intervention.  Bowel/Bladder Dysfunction: none  Conservative measures:  Physical therapy: has participated in 19 visits at [facility] and was discharged on 10/06/22 Multimodal medical therapy including regular antiinflammatories: Cymbalta  Injections: has received epidural steroid injections diagnostic right L3 selective nerve root block with local anesthetic only for surgical planning on 08/06/20 at [facility] 2.   Right L3-L4 and right L4-L5 TF ESI on 01/24/20 [name]) 3.   Right L3-L4 and right L4-L5 TF ESI on 01/01/20 [name])  Past Surgery: none  TAEVION HOLTORF has no symptoms of cervical myelopathy.  The symptoms are causing a significant impact on the patient's life.   I have utilized the care everywhere function in epic to review the outside records available from external health systems.  Progress note from Stacy on 10/27/22:   History of HTN, chronic venous insufficiency, IBS, DM with polyneuropathy.    Last seen by me on 08/30/22 for his lumbar spine and numbness in his hands.    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 MRI shows moderate central stenosis L3-L4 with bilateral foraminal stenosis at L3-L5 and left foraminal stenosis L5-S1.    Leg symptoms are likely from central  stenosis L3-L4. LBP is likely multifactorial.    He continues with constant numbness in hands. No neck or arm pain.    He has known cervical spondylosis with multilevel foraminal stenosis C3-C7. He has mild central stenosis C5-C6.    He has been discharged from PT for his back. He saw ortho at [facility] for his right shoulder OA. He had EMG done and is here to review it.   He had flare up of some left leg pain a few weeks ago. This improved with hot/cold compresses. He has only occasional LBP that is tolerable. He continues to do his HEP from PT. He is not having any pain or weakness in his legs and his walking is better, but he still cannot walk as far as he would like.    He still has constant numbness in the tips of his fingers. No neck or arm pain. This is a little better and is tolerable.    No long term improvement with previous lumbar injections.    He does not smoke.       The symptoms are causing a significant impact on the patient's life.   Review of Systems:  A 10 point review of systems is negative, except for the pertinent positives and negatives detailed in the HPI.  Past Medical History: Past Medical History:  Diagnosis Date   Depression ~2010   situational and now better    Diabetes mellitus with polyneuropathy (HCC)    GERD (gastroesophageal reflux disease)    Hypertension    IBS (irritable bowel syndrome)  Osteoarthritis of multiple joints     Past Surgical History: Past Surgical History:  Procedure Laterality Date   COLONOSCOPY WITH PROPOFOL N/A 07/18/2017   Procedure: COLONOSCOPY WITH PROPOFOL;  Surgeon: Christena Deem, MD;  Location: [facility] ENDOSCOPY;  Service: Endoscopy;  Laterality: N/A;   CYST EXCISION  1952   left leg   KNEE ARTHROSCOPY Left 2011   REPLACEMENT TOTAL KNEE Left    TOTAL HIP ARTHROPLASTY Right     Allergies: Allergies as of 11/10/2022 - Review Complete 11/10/2022  Allergen Reaction Noted   Paxil [paroxetine hcl]  04/18/2017    Shellfish allergy Swelling 04/18/2017   Latex Rash 04/18/2017    Medications:  Current Outpatient Medications:    amLODipine (NORVASC) 5 MG tablet, TAKE 1 TABLET BY MOUTH EVERY DAY, Disp: 90 tablet, Rfl: 3   amoxicillin (AMOXIL) 500 MG capsule, Take all 4 tablets by mouth 1 hour before dental procedure, Disp: 4 capsule, Rfl: 2   atorvastatin (LIPITOR) 10 MG tablet, TAKE 1 TABLET(10 MG) BY MOUTH 1 TIME A WEEK, Disp: 13 tablet, Rfl: 3   diphenoxylate-atropine (LOMOTIL) 2.5-0.025 MG tablet, TAKE 1 TABLET BY MOUTH EVERY DAY AS NEEDED FOR DIARRHEA OR LOOSE STOOLS, Disp: 30 tablet, Rfl: 0   DULoxetine (CYMBALTA) 30 MG capsule, TAKE 1 CAPSULE(30 MG) BY MOUTH DAILY, Disp: 90 capsule, Rfl: 0   glipiZIDE (GLUCOTROL XL) 5 MG 24 hr tablet, TAKE 1 TABLET BY MOUTH EVERY DAY WITH BREAKFAST, Disp: 90 tablet, Rfl: 3   glucose blood (ONETOUCH ULTRA) test strip, Use to check blood sugar once a day. Dx Code E11.42, Disp: 100 each, Rfl: 4   lisinopril-hydrochlorothiazide (ZESTORETIC) 20-12.5 MG tablet, TAKE 2 TABLETS BY MOUTH EVERY DAY, Disp: 180 tablet, Rfl: 3   metFORMIN (GLUCOPHAGE-XR) 750 MG 24 hr tablet, TAKE 1 TABLET BY MOUTH DAILY WITH SUPPER, Disp: 90 tablet, Rfl: 3   mometasone (ELOCON) 0.1 % ointment, Apply topically 2 (two) times daily., Disp: , Rfl:    Multiple Vitamins-Minerals (MULTIVITAMIN MEN PO), Take by mouth., Disp: , Rfl:    OneTouch Delica Lancets 30G MISC, 1 each by Does not apply route daily. Dx Code E11.42, Disp: 100 each, Rfl: 3   triamcinolone cream (KENALOG) 0.1 %, APPLY TOPICALLY TO THE AFFECTED AREA TWICE DAILY AS NEEDED, Disp: 45 g, Rfl: 1  Social History: Social History   Tobacco Use   Smoking status: Never    Passive exposure: Past   Smokeless tobacco: Never  Vaping Use   Vaping status: Never Used  Substance Use Topics   Alcohol use: No   Drug use: No    Family Medical History: Family History  Problem Relation Age of Onset   Arthritis Mother    Hypertension Mother     Dementia Mother    Heart attack Father    Heart disease Father    Cancer Father        lymphoma    Physical Examination: Vitals:   11/10/22 1109  BP: 130/70    General: Patient is in no apparent distress. Attention to examination is appropriate.  Neck:   Supple.  Full range of motion.  Respiratory: Patient is breathing without any difficulty.   NEUROLOGICAL:     Awake, alert, oriented to person, place, and time.  Speech is clear and fluent.   Cranial Nerves: Pupils equal round and reactive to light.  Facial tone is symmetric.  Facial sensation is symmetric. Shoulder shrug is symmetric. Tongue protrusion is midline.  There is no pronator drift.  Strength: Side Biceps Triceps Deltoid Interossei Grip Wrist Ext. Wrist Flex.  R 5 5 5 5 5 5 5   L 5 5 5 5 5 5 5    Side Iliopsoas Quads Hamstring PF DF EHL  R 5 5 5 5 5 5   L 5 5 5 5 5 5    Reflexes are 1+ and symmetric at the biceps, triceps, brachioradialis, patella and achilles.   Hoffman's is absent.   Bilateral upper and lower extremity sensation is intact to light touch.    No evidence of dysmetria noted.  Gait is untested.     Medical Decision Making  Imaging: MRI C spine 02/03/2023 IMPRESSION: 1. Generalized cervical spine degeneration with mild C7-T1 anterolisthesis. 2. Notably bulky facet spurring with ankylosis towards the right at C2-3 and C3-4. 3. Foraminal impingement on the right at C3-4, C5-6, and C6-7. 4. Moderate foraminal narrowing on the left at C5-6. 5. Up to mild spinal stenosis at C5-6.     Electronically Signed   By: Tiburcio Pea M.D.   On: 02/03/2022 13:15  MRI L spine 02/03/2023 IMPRESSION: 1. Advanced degeneration at L3-4 and below. Dominant change since 2021 is progression at L3-4 with new discogenic edema eccentric to the right. 2. L3-4 right more than left foraminal impingement and moderate spinal stenosis. 3. L4-5 bilateral and L5-S1 left moderate foraminal narrowing.      Electronically Signed   By: Tiburcio Pea M.D.   On: 02/03/2022 13:20    I have personally reviewed the images and agree with the above interpretation.  Assessment and Plan: Mr. Caylor is a pleasant 77 y.o. male with neurogenic claudication with worsening symptoms when he stands or walks.  He has lateral recess stenosis at L3-4, L4-5, and L5-S1 compressing his left-sided traversing nerve roots.  He has tried and failed conservative management.  At this point, I recommended left-sided lumbar decompression from L3-S1.  I discussed the planned procedure at length with the patient, including the risks, benefits, alternatives, and indications. The risks discussed include but are not limited to bleeding, infection, need for reoperation, spinal fluid leak, stroke, vision loss, anesthetic complication, coma, paralysis, and even death. I also described in detail that improvement was not guaranteed.  The patient expressed understanding of these risks, and asked that we proceed with surgery. I described the surgery in layman's terms, and gave ample opportunity for questions, which were answered to the best of my ability.  I spent a total of 30 minutes in this patient's care today. This time was spent reviewing pertinent records including imaging studies, obtaining and confirming history, performing a directed evaluation, formulating and discussing my recommendations, and documenting the visit within the medical record.      Thank you for involving me in the care of this patient.      Carnisha Feltz K. Myer Haff MD, [facility]

## 2022-11-08 NOTE — Progress Notes (Unsigned)
Referring Physician:  No referring provider defined for this encounter.  Primary Physician:  Karie Schwalbe, MD  History of Present Illness: 11/10/2022 [name] is here today with a chief complaint of low back and left leg pain.  His left leg is bothering him the most.  He gets pain down the posterior aspect of his left leg from his buttock down to his foot and ankle.  He has been dealing with this for more than a year.  He has previously undergone physical therapy as well as injections over several different courses without longstanding improvement.  Standing and walking are difficult.  Nothing really helps.  He is now at the point of considering surgical intervention.  Bowel/Bladder Dysfunction: none  Conservative measures:  Physical therapy: has participated in 19 visits at [facility] and was discharged on 10/06/22 Multimodal medical therapy including regular antiinflammatories: Cymbalta  Injections: has received epidural steroid injections diagnostic right L3 selective nerve root block with local anesthetic only for surgical planning on 08/06/20 at [facility].   Right L3-L4 and right L4-L5 TF ESI on 01/24/20 Frank Vega) 3.   Right L3-L4 and right L4-L5 TF ESI on 01/01/20 Frank Vega)  Past Surgery: none  [name] has no symptoms of cervical myelopathy.  The symptoms are causing a significant impact on the patient's life.   I have utilized the care everywhere function in epic to review the outside records available from external health systems.  Progress note from Stacy on 10/27/22:   History of HTN, chronic venous insufficiency, IBS, DM with polyneuropathy.    Last seen by me on 08/30/22 for his lumbar spine and numbness in his hands.    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 MRI shows moderate central stenosis L3-L4 with bilateral foraminal stenosis at L3-L5 and left foraminal stenosis L5-S1.    Leg symptoms are likely from central  stenosis L3-L4. LBP is likely multifactorial.    He continues with constant numbness in hands. No neck or arm pain.    He has known cervical spondylosis with multilevel foraminal stenosis C3-C7. He has mild central stenosis C5-C6.    He has been discharged from PT for his back. He saw ortho at [facility] for his right shoulder OA. He had EMG done and is here to review it.   He had flare up of some left leg pain a few weeks ago. This improved with hot/cold compresses. He has only occasional LBP that is tolerable. He continues to do his HEP from PT. He is not having any pain or weakness in his legs and his walking is better, but he still cannot walk as far as he would like.    He still has constant numbness in the tips of his fingers. No neck or arm pain. This is a little better and is tolerable.    No long term improvement with previous lumbar injections.    He does not smoke.       The symptoms are causing a significant impact on the patient's life.   Review of Systems:  A 10 point review of systems is negative, except for the pertinent positives and negatives detailed in the HPI.  Past Medical History: Past Medical History:  Diagnosis Date   Depression ~2010   situational and now better    Diabetes mellitus with polyneuropathy (HCC)    GERD (gastroesophageal reflux disease)    Hypertension    IBS (irritable bowel syndrome)  Osteoarthritis of multiple joints     Past Surgical History: Past Surgical History:  Procedure Laterality Date   COLONOSCOPY WITH PROPOFOL N/A 07/18/2017   Procedure: COLONOSCOPY WITH PROPOFOL;  Surgeon: Christena Deem, MD;  Location: [facility] ENDOSCOPY;  Service: Endoscopy;  Laterality: N/A;   CYST EXCISION  1952   left leg   KNEE ARTHROSCOPY Left 2011   REPLACEMENT TOTAL KNEE Left    TOTAL HIP ARTHROPLASTY Right     Allergies: Allergies as of 11/10/2022 - Review Complete 11/10/2022  Allergen Reaction Noted   Paxil [paroxetine hcl]  04/18/2017    Shellfish allergy Swelling 04/18/2017   Latex Rash 04/18/2017    Medications:  Current Outpatient Medications:    amLODipine (NORVASC) 5 MG tablet, TAKE 1 TABLET BY MOUTH EVERY DAY, Disp: 90 tablet, Rfl: 3   amoxicillin (AMOXIL) 500 MG capsule, Take all 4 tablets by mouth 1 hour before dental procedure, Disp: 4 capsule, Rfl: 2   atorvastatin (LIPITOR) 10 MG tablet, TAKE 1 TABLET(10 MG) BY MOUTH 1 TIME A WEEK, Disp: 13 tablet, Rfl: 3   diphenoxylate-atropine (LOMOTIL) 2.5-0.025 MG tablet, TAKE 1 TABLET BY MOUTH EVERY DAY AS NEEDED FOR DIARRHEA OR LOOSE STOOLS, Disp: 30 tablet, Rfl: 0   DULoxetine (CYMBALTA) 30 MG capsule, TAKE 1 CAPSULE(30 MG) BY MOUTH DAILY, Disp: 90 capsule, Rfl: 0   glipiZIDE (GLUCOTROL XL) 5 MG 24 hr tablet, TAKE 1 TABLET BY MOUTH EVERY DAY WITH BREAKFAST, Disp: 90 tablet, Rfl: 3   glucose blood (ONETOUCH ULTRA) test strip, Use to check blood sugar once a day. Dx Code E11.42, Disp: 100 each, Rfl: 4   lisinopril-hydrochlorothiazide (ZESTORETIC) 20-12.5 MG tablet, TAKE 2 TABLETS BY MOUTH EVERY DAY, Disp: 180 tablet, Rfl: 3   metFORMIN (GLUCOPHAGE-XR) 750 MG 24 hr tablet, TAKE 1 TABLET BY MOUTH DAILY WITH SUPPER, Disp: 90 tablet, Rfl: 3   mometasone (ELOCON) 0.1 % ointment, Apply topically 2 (two) times daily., Disp: , Rfl:    Multiple Vitamins-Minerals (MULTIVITAMIN MEN PO), Take by mouth., Disp: , Rfl:    OneTouch Delica Lancets 30G MISC, 1 each by Does not apply route daily. Dx Code E11.42, Disp: 100 each, Rfl: 3   triamcinolone cream (KENALOG) 0.1 %, APPLY TOPICALLY TO THE AFFECTED AREA TWICE DAILY AS NEEDED, Disp: 45 g, Rfl: 1  Social History: Social History   Tobacco Use   Smoking status: Never    Passive exposure: Past   Smokeless tobacco: Never  Vaping Use   Vaping status: Never Used  Substance Use Topics   Alcohol use: No   Drug use: No    Family Medical History: Family History  Problem Relation Age of Onset   Arthritis Mother    Hypertension Mother     Dementia Mother    Heart attack Father    Heart disease Father    Cancer Father        lymphoma    Physical Examination: Vitals:   11/10/22 1109  BP: 130/70    General: Patient is in no apparent distress. Attention to examination is appropriate.  Neck:   Supple.  Full range of motion.  Respiratory: Patient is breathing without any difficulty.   NEUROLOGICAL:     Awake, alert, oriented to person, place, and time.  Speech is clear and fluent.   Cranial Nerves: Pupils equal round and reactive to light.  Facial tone is symmetric.  Facial sensation is symmetric. Shoulder shrug is symmetric. Tongue protrusion is midline.  There is no pronator drift.  Strength: Side Biceps Triceps Deltoid Interossei Grip Wrist Ext. Wrist Flex.  R 5 5 5 5 5 5 5   L 5 5 5 5 5 5 5    Side Iliopsoas Quads Hamstring PF DF EHL  R 5 5 5 5 5 5   L 5 5 5 5 5 5    Reflexes are 1+ and symmetric at the biceps, triceps, brachioradialis, patella and achilles.   Hoffman's is absent.   Bilateral upper and lower extremity sensation is intact to light touch.    No evidence of dysmetria noted.  Gait is untested.     Medical Decision Making  Imaging: MRI C spine 02/03/2023 IMPRESSION: 1. Generalized cervical spine degeneration with mild C7-T1 anterolisthesis. 2. Notably bulky facet spurring with ankylosis towards the right at C2-3 and C3-4. 3. Foraminal impingement on the right at C3-4, C5-6, and C6-7. 4. Moderate foraminal narrowing on the left at C5-6. 5. Up to mild spinal stenosis at C5-6.     Electronically Signed   By: Tiburcio Pea M.D.   On: 02/03/2022 13:15  MRI L spine 02/03/2023 IMPRESSION: 1. Advanced degeneration at L3-4 and below. Dominant change since 2021 is progression at L3-4 with new discogenic edema eccentric to the right. 2. L3-4 right more than left foraminal impingement and moderate spinal stenosis. 3. L4-5 bilateral and L5-S1 left moderate foraminal narrowing.      Electronically Signed   By: Tiburcio Pea M.D.   On: 02/03/2022 13:20    I have personally reviewed the images and agree with the above interpretation.  Assessment and Plan: Mr. Nicolo is a pleasant 77 y.o. male with neurogenic claudication with worsening symptoms when he stands or walks.  He has lateral recess stenosis at L3-4, L4-5, and L5-S1 compressing his left-sided traversing nerve roots.  He has tried and failed conservative management.  At this point, I recommended left-sided lumbar decompression from L3-S1.  I discussed the planned procedure at length with the patient, including the risks, benefits, alternatives, and indications. The risks discussed include but are not limited to bleeding, infection, need for reoperation, spinal fluid leak, stroke, vision loss, anesthetic complication, coma, paralysis, and even death. I also described in detail that improvement was not guaranteed.  The patient expressed understanding of these risks, and asked that we proceed with surgery. I described the surgery in layman's terms, and gave ample opportunity for questions, which were answered to the best of my ability.  I spent a total of 30 minutes in this patient's care today. This time was spent reviewing pertinent records including imaging studies, obtaining and confirming history, performing a directed evaluation, formulating and discussing my recommendations, and documenting the visit within the medical record.      Thank you for involving me in the care of this patient.      Jamie Belger K. Myer Haff MD, [facility] Neurosurgery

## 2022-11-10 ENCOUNTER — Other Ambulatory Visit: Payer: Self-pay

## 2022-11-10 ENCOUNTER — Ambulatory Visit (INDEPENDENT_AMBULATORY_CARE_PROVIDER_SITE_OTHER): Payer: Medicare Other | Admitting: Neurosurgery

## 2022-11-10 ENCOUNTER — Encounter: Payer: Self-pay | Admitting: Neurosurgery

## 2022-11-10 VITALS — BP 130/70 | Ht 67.5 in | Wt 223.8 lb

## 2022-11-10 DIAGNOSIS — M48062 Spinal stenosis, lumbar region with neurogenic claudication: Secondary | ICD-10-CM

## 2022-11-10 DIAGNOSIS — Z01818 Encounter for other preprocedural examination: Secondary | ICD-10-CM

## 2022-11-10 NOTE — Patient Instructions (Signed)
Please see below for information in regards to your upcoming surgery:   Planned surgery: L3-S1 posterior spinal decompression   Surgery date: 11/30/22 at [facility] (Medical Mall: [address], [place], [place] [address]) - you will find out your arrival time the business day before your surgery.   Pre-op appointment at [facility] Pre-admit Testing: we will call you with a date/time for this. If you are scheduled for an in person appointment, Pre-admit Testing is located on the first floor of the Medical Arts building, [address], [address]. Please bring all prescriptions in the original prescription bottles to your appointment. During this appointment, they will advise you which medications you can take the morning of surgery, and which medications you will need to hold for surgery. Labs (such as blood work, EKG) may be done at your pre-op appointment. You are not required to fast for these labs. Should you need to change your pre-op appointment, please call Pre-admit testing at [phone].      Diabetes/weight loss medications:  Metformin: hold for 2 days prior to surgery    Surgical clearance: we will send a clearance form to Dr Alphonsus Sias    Common restrictions after surgery: No bending, lifting, or twisting ("BLT"). Avoid lifting objects heavier than 10 pounds for the first 6 weeks after surgery. Where possible, avoid household activities that involve lifting, bending, reaching, pushing, or pulling such as laundry, vacuuming, grocery shopping, and childcare. Try to arrange for help from friends and family for these activities while you heal. Do not drive while taking prescription pain medication. Weeks 6 through 12 after surgery: avoid lifting more than 25 pounds.      How to contact us:  If you have any questions/concerns before or after surgery, you can reach [place] at [phone], or you can send a mychart message. We can be reached by  phone or mychart 8am-4pm, Monday-Friday.  *Please note: Calls after 4pm are forwarded to a third party answering service. Mychart messages are not routinely monitored during evenings, weekends, and holidays. Please call our office to contact the answering service for urgent concerns during non-business hours.    Appointments/FMLA & disability paperwork: Patty & Cristin  Nurse: Royston Cowper  Medical assistants: Laurann Montana, & Lyla Son Physician Assistants: Manning Charity & Drake Leach Surgeons: Venetia Night, MD & Ernestine Mcmurray, MD

## 2022-11-10 NOTE — Addendum Note (Signed)
Addended by: Sharlot Gowda on: 11/10/2022 11:49 AM   Modules accepted: Orders

## 2022-11-15 ENCOUNTER — Ambulatory Visit: Payer: Medicare Other | Admitting: Neurosurgery

## 2022-11-22 ENCOUNTER — Other Ambulatory Visit: Payer: Self-pay

## 2022-11-22 ENCOUNTER — Encounter
Admission: RE | Admit: 2022-11-22 | Discharge: 2022-11-22 | Disposition: A | Discharge status: Home / Self Care | Payer: Medicare Other | Source: Ambulatory Visit | Attending: Neurosurgery | Admitting: Neurosurgery

## 2022-11-22 ENCOUNTER — Telehealth: Payer: Self-pay | Admitting: Internal Medicine

## 2022-11-22 VITALS — BP 116/63 | HR 91 | Resp 14 | Ht 67.5 in | Wt 224.0 lb

## 2022-11-22 DIAGNOSIS — I10 Essential (primary) hypertension: Secondary | ICD-10-CM | POA: Insufficient documentation

## 2022-11-22 DIAGNOSIS — Z01810 Encounter for preprocedural cardiovascular examination: Secondary | ICD-10-CM | POA: Diagnosis not present

## 2022-11-22 DIAGNOSIS — E1142 Type 2 diabetes mellitus with diabetic polyneuropathy: Secondary | ICD-10-CM | POA: Insufficient documentation

## 2022-11-22 DIAGNOSIS — Z01818 Encounter for other preprocedural examination: Secondary | ICD-10-CM

## 2022-11-22 DIAGNOSIS — Z01812 Encounter for preprocedural laboratory examination: Secondary | ICD-10-CM | POA: Insufficient documentation

## 2022-11-22 HISTORY — DX: Malignant (primary) neoplasm, unspecified: C80.1

## 2022-11-22 LAB — TYPE AND SCREEN
ABO/RH(D): O POS
Antibody Screen: NEGATIVE

## 2022-11-22 LAB — SURGICAL PCR SCREEN
MRSA, PCR: NEGATIVE
Staphylococcus aureus: NEGATIVE

## 2022-11-22 NOTE — Telephone Encounter (Signed)
I do not remember seeing a form for him. I will be on the look out for the ones they are sending, again.

## 2022-11-22 NOTE — Telephone Encounter (Signed)
[facility] called requesting a update on clearance ppw that was faxed over on 11/10/22 for Dr. Alphonsus Sias to sign off on? Office stated they'd refax ppw over in case our office didn't receive ppw. Office stated pt has upcoming surgery soon. Call back # [phone]

## 2022-11-22 NOTE — Patient Instructions (Signed)
Your procedure is scheduled on: Wednesday 11/30/22 To find out your arrival time, please call [phone] between 1PM - 3PM on:   Tuesday 11/29/22 Report to the Registration Desk on the 1st floor of the Medical Mall. Free Valet parking is available.  If your arrival time is 6:00 am, do not arrive before that time as the Medical Mall entrance doors do not open until 6:00 am.  REMEMBER: Instructions that are not followed completely may result in serious medical risk, up to and including death; or upon the discretion of your surgeon and anesthesiologist your surgery may need to be rescheduled.  Do not eat food after midnight the night before surgery.  No gum chewing or hard candies.  You may however, drink CLEAR liquids up to 2 hours before you are scheduled to arrive for your surgery. Do not drink anything within 2 hours of your scheduled arrival time.  Clear liquids include: - water   Type 1 and Type 2 diabetics should only drink water.  One week prior to surgery: Stop Anti-inflammatories (NSAIDS) such as Advil, Aleve, Ibuprofen, Motrin, Naproxen, Naprosyn and Aspirin based products such as Excedrin, Goody's Powder, BC Powder. You may however, continue to take Tylenol if needed for pain up until the day of surgery.  Stop ANY OVER THE COUNTER supplements until after surgery.  Continue taking all prescribed medications with the exception of the following: Metformin, last dose is Sunday 11/27/22  TAKE ONLY THESE MEDICATIONS THE MORNING OF SURGERY WITH A SIP OF WATER:  amLODipine (NORVASC) 5 MG tablet  DULoxetine (CYMBALTA) 30 MG capsule   No Alcohol for 24 hours before or after surgery.  No Smoking including e-cigarettes for 24 hours before surgery.  No chewable tobacco products for at least 6 hours before surgery.  No nicotine patches on the day of surgery.  Do not use any "recreational" drugs for at least a week (preferably 2 weeks) before your surgery.  Please be advised that  the combination of cocaine and anesthesia may have negative outcomes, up to and including death. If you test positive for cocaine, your surgery will be cancelled.  On the morning of surgery brush your teeth with toothpaste and water, you may rinse your mouth with mouthwash if you wish. Do not swallow any toothpaste or mouthwash.  Use CHG Soap or wipes as directed on instruction sheet. Shower daily with CHG starting Saturday 11/26/22  Do not wear lotions, powders, or perfumes on the day of surgery  Do not shave body hair from the neck down 48 hours before surgery.  Wear comfortable clothing (specific to your surgery type) to the hospital.  Do not wear jewelry, make-up, hairpins, clips or nail polish.  For welded (permanent) jewelry: bracelets, anklets, waist bands, etc.  Please have this removed prior to surgery.  If it is not removed, there is a chance that hospital personnel will need to cut it off on the day of surgery. Contact lenses, hearing aids and dentures may not be worn into surgery.  Do not bring valuables to the hospital. [facility] is not responsible for any missing/lost belongings or valuables.   Notify your doctor if there is any change in your medical condition (cold, fever, infection).  If you are being discharged the day of surgery, you will not be allowed to drive home. You will need a responsible individual to drive you home and stay with you for 24 hours after surgery.   If you are taking public transportation, you will need  to have a responsible individual with you.  If you are being admitted to the hospital overnight, leave your suitcase in the car. After surgery it may be brought to your room.  In case of increased patient census, it may be necessary for you, the patient, to continue your postoperative care in the Same Day Surgery department.  After surgery, you can help prevent lung complications by doing breathing exercises.  Take deep breaths and cough every  1-2 hours. Your doctor may order a device called an Incentive Spirometer to help you take deep breaths. When coughing or sneezing, hold a pillow firmly against your incision with both hands. This is called "splinting." Doing this helps protect your incision. It also decreases belly discomfort.  Surgery Visitation Policy:  Patients undergoing a surgery or procedure may have two family members or support persons with them as long as the person is not COVID-19 positive or experiencing its symptoms.   Inpatient Visitation:    Visiting hours are 7 a.m. to 8 p.m. Up to four visitors are allowed at one time in a patient room. The visitors may rotate out with other people during the day. One designated support person (adult) may remain overnight.  Please call the Pre-admissions Testing Dept. at [phone] if you have any questions about these instructions.    Pre-operative 5 CHG Bath Instructions   You can play a key role in reducing the risk of infection after surgery. Your skin needs to be as free of germs as possible. You can reduce the number of germs on your skin by washing with CHG (chlorhexidine gluconate) soap before surgery. CHG is an antiseptic soap that kills germs and continues to kill germs even after washing.   DO NOT use if you have an allergy to chlorhexidine/CHG or antibacterial soaps. If your skin becomes reddened or irritated, stop using the CHG and notify one of our RNs at [phone].   Please shower with the CHG soap starting 4 days before surgery using the following schedule:     Please keep in mind the following:  DO NOT shave, including legs and underarms, starting the day of your first shower.   You may shave your face at any point before/day of surgery.  Place clean sheets on your bed the day you start using CHG soap. Use a clean washcloth (not used since being washed) for each shower. DO NOT sleep with pets once you start using the CHG.   CHG Shower  Instructions:  If you choose to wash your hair and private area, wash first with your normal shampoo/soap.  After you use shampoo/soap, rinse your hair and body thoroughly to remove shampoo/soap residue.  Turn the water OFF and apply about 3 tablespoons (45 ml) of CHG soap to a CLEAN washcloth.  Apply CHG soap ONLY FROM YOUR NECK DOWN TO YOUR TOES (washing for 3-5 minutes)  DO NOT use CHG soap on face, private areas, open wounds, or sores.  Pay special attention to the area where your surgery is being performed.  If you are having back surgery, having someone wash your back for you may be helpful. Wait 2 minutes after CHG soap is applied, then you may rinse off the CHG soap.  Pat dry with a clean towel  Put on clean clothes/pajamas   If you choose to wear lotion, please use ONLY the CHG-compatible lotions on the back of this paper.     Additional instructions for the day of surgery: DO NOT APPLY  any lotions, deodorants, cologne, or perfumes.   Put on clean/comfortable clothes.  Brush your teeth.  Ask your nurse before applying any prescription medications to the skin.      CHG Compatible Lotions   Aveeno Moisturizing lotion  Cetaphil Moisturizing Cream  Cetaphil Moisturizing Lotion  Clairol Herbal Essence Moisturizing Lotion, Dry Skin  Clairol Herbal Essence Moisturizing Lotion, Extra Dry Skin  Clairol Herbal Essence Moisturizing Lotion, Normal Skin  Curel Age Defying Therapeutic Moisturizing Lotion with Alpha Hydroxy  Curel Extreme Care Body Lotion  Curel Soothing Hands Moisturizing Hand Lotion  Curel Therapeutic Moisturizing Cream, Fragrance-Free  Curel Therapeutic Moisturizing Lotion, Fragrance-Free  Curel Therapeutic Moisturizing Lotion, Original Formula  Eucerin Daily Replenishing Lotion  Eucerin Dry Skin Therapy Plus Alpha Hydroxy Crme  Eucerin Dry Skin Therapy Plus Alpha Hydroxy Lotion  Eucerin Original Crme  Eucerin Original Lotion  Eucerin Plus Crme Eucerin Plus  Lotion  Eucerin TriLipid Replenishing Lotion  Keri Anti-Bacterial Hand Lotion  Keri Deep Conditioning Original Lotion Dry Skin Formula Softly Scented  Keri Deep Conditioning Original Lotion, Fragrance Free Sensitive Skin Formula  Keri Lotion Fast Absorbing Fragrance Free Sensitive Skin Formula  Keri Lotion Fast Absorbing Softly Scented Dry Skin Formula  Keri Original Lotion  Keri Skin Renewal Lotion Keri Silky Smooth Lotion  Keri Silky Smooth Sensitive Skin Lotion  Nivea Body Creamy Conditioning Oil  Nivea Body Extra Enriched Teacher, adult education Moisturizing Lotion Nivea Crme  Nivea Skin Firming Lotion  NutraDerm 30 Skin Lotion  NutraDerm Skin Lotion  NutraDerm Therapeutic Skin Cream  NutraDerm Therapeutic Skin Lotion  ProShield Protective Hand Cream  Provon moisturizing lotion

## 2022-11-29 MED ORDER — CEFAZOLIN SODIUM-DEXTROSE 2-4 GM/100ML-% IV SOLN
2.0000 g | INTRAVENOUS | Status: AC
  Administered 2022-11-30: 2 g via INTRAVENOUS

## 2022-11-29 MED ORDER — ORAL CARE MOUTH RINSE: 15.0000 mL | Freq: Once | OROMUCOSAL | Status: AC

## 2022-11-29 MED ORDER — CEFAZOLIN IN SODIUM CHLORIDE 2-0.9 GM/100ML-% IV SOLN
2.0000 g | Freq: Once | INTRAVENOUS | Status: DC
  Filled 2022-11-29: qty 100

## 2022-11-29 MED ORDER — SODIUM CHLORIDE 0.9 % IV SOLN: INTRAVENOUS | Status: DC

## 2022-11-29 MED ORDER — CHLORHEXIDINE GLUCONATE 0.12 % MT SOLN
15.0000 mL | Freq: Once | OROMUCOSAL | Status: AC
  Administered 2022-11-30: 15 mL via OROMUCOSAL

## 2022-11-29 MED ORDER — FAMOTIDINE 20 MG PO TABS
20.0000 mg | ORAL_TABLET | Freq: Once | ORAL | Status: AC
  Administered 2022-11-30: 20 mg via ORAL

## 2022-11-30 ENCOUNTER — Observation Stay
Admission: RE | Admit: 2022-11-30 | Discharge: 2022-12-01 | Disposition: A | Discharge status: Home / Self Care | Payer: Medicare Other | Source: Ambulatory Visit | Attending: Neurosurgery | Admitting: Neurosurgery

## 2022-11-30 ENCOUNTER — Encounter: Payer: Self-pay | Admitting: Neurosurgery

## 2022-11-30 ENCOUNTER — Other Ambulatory Visit: Payer: Self-pay

## 2022-11-30 ENCOUNTER — Encounter
Admission: RE | Disposition: A | Discharge status: Home / Self Care | Payer: Self-pay | Source: Ambulatory Visit | Attending: Neurosurgery

## 2022-11-30 ENCOUNTER — Ambulatory Visit: Payer: Medicare Other

## 2022-11-30 ENCOUNTER — Ambulatory Visit: Payer: Self-pay | Admitting: Certified Registered Nurse Anesthetist

## 2022-11-30 ENCOUNTER — Ambulatory Visit: Payer: Medicare Other | Admitting: Urgent Care

## 2022-11-30 DIAGNOSIS — Z91040 Latex allergy status: Secondary | ICD-10-CM | POA: Diagnosis not present

## 2022-11-30 DIAGNOSIS — E119 Type 2 diabetes mellitus without complications: Secondary | ICD-10-CM | POA: Insufficient documentation

## 2022-11-30 DIAGNOSIS — Z96652 Presence of left artificial knee joint: Secondary | ICD-10-CM | POA: Diagnosis not present

## 2022-11-30 DIAGNOSIS — M48062 Spinal stenosis, lumbar region with neurogenic claudication: Secondary | ICD-10-CM | POA: Diagnosis not present

## 2022-11-30 DIAGNOSIS — Z01818 Encounter for other preprocedural examination: Secondary | ICD-10-CM

## 2022-11-30 DIAGNOSIS — M48061 Spinal stenosis, lumbar region without neurogenic claudication: Principal | ICD-10-CM | POA: Diagnosis present

## 2022-11-30 DIAGNOSIS — Z7984 Long term (current) use of oral hypoglycemic drugs: Secondary | ICD-10-CM | POA: Insufficient documentation

## 2022-11-30 DIAGNOSIS — Z79899 Other long term (current) drug therapy: Secondary | ICD-10-CM | POA: Insufficient documentation

## 2022-11-30 DIAGNOSIS — M5416 Radiculopathy, lumbar region: Secondary | ICD-10-CM

## 2022-11-30 DIAGNOSIS — E1142 Type 2 diabetes mellitus with diabetic polyneuropathy: Secondary | ICD-10-CM

## 2022-11-30 DIAGNOSIS — Z96641 Presence of right artificial hip joint: Secondary | ICD-10-CM | POA: Diagnosis not present

## 2022-11-30 DIAGNOSIS — I10 Essential (primary) hypertension: Secondary | ICD-10-CM | POA: Diagnosis not present

## 2022-11-30 DIAGNOSIS — M5186 Other intervertebral disc disorders, lumbar region: Secondary | ICD-10-CM | POA: Diagnosis not present

## 2022-11-30 HISTORY — PX: LUMBAR LAMINECTOMY/DECOMPRESSION MICRODISCECTOMY: SHX5026

## 2022-11-30 LAB — GLUCOSE, CAPILLARY
Glucose-Capillary: 152 mg/dL — ABNORMAL HIGH (ref 70–99)
Glucose-Capillary: 210 mg/dL — ABNORMAL HIGH (ref 70–99)
Glucose-Capillary: 243 mg/dL — ABNORMAL HIGH (ref 70–99)
Glucose-Capillary: 360 mg/dL — ABNORMAL HIGH (ref 70–99)
Glucose-Capillary: 321 mg/dL — ABNORMAL HIGH (ref 70–99)

## 2022-11-30 LAB — CREATININE, SERUM
Creatinine, Ser: 0.82 mg/dL (ref 0.61–1.24)
GFR, Estimated: >60 mL/min (ref >60)

## 2022-11-30 LAB — CBC
HCT: 37.8 % — ABNORMAL LOW (ref 39.0–52.0)
Hemoglobin: 12.8 g/dL — ABNORMAL LOW (ref 13.0–17.0)
MCH: 31.8 pg (ref 26.0–34.0)
MCHC: 33.9 g/dL (ref 30.0–36.0)
MCV: 93.8 fL (ref 80.0–100.0)
Platelets: 273 10*3/uL (ref 150–400)
RBC: 4.03 MIL/uL — ABNORMAL LOW (ref 4.22–5.81)
RDW: 12.7 % (ref 11.5–15.5)
WBC: 10.6 10*3/uL — ABNORMAL HIGH (ref 4.0–10.5)
nRBC: 0 % (ref 0.0–0.2)

## 2022-11-30 LAB — ABO/RH: ABO/RH(D): O POS

## 2022-11-30 SURGERY — LUMBAR LAMINECTOMY/DECOMPRESSION MICRODISCECTOMY 3 LEVELS
Anesthesia: General | Site: Back

## 2022-11-30 MED ORDER — BUPIVACAINE-EPINEPHRINE (PF) 0.5% -1:200000 IJ SOLN
INTRAMUSCULAR | Status: DC | PRN
  Administered 2022-11-30: 7 mL via PERINEURAL

## 2022-11-30 MED ORDER — CEFAZOLIN SODIUM-DEXTROSE 2-4 GM/100ML-% IV SOLN
2.0000 g | Freq: Three times a day (TID) | INTRAVENOUS | Status: AC
  Administered 2022-11-30 (×2): 2 g via INTRAVENOUS

## 2022-11-30 MED ORDER — 0.9 % SODIUM CHLORIDE (POUR BTL) OPTIME
TOPICAL | Status: DC | PRN
  Administered 2022-11-30: 500 mL

## 2022-11-30 MED ORDER — BISACODYL 10 MG RE SUPP: 10.0000 mg | Freq: Every day | RECTAL | Status: DC | PRN

## 2022-11-30 MED ORDER — ENOXAPARIN SODIUM 40 MG/0.4ML IJ SOSY
40.0000 mg | PREFILLED_SYRINGE | INTRAMUSCULAR | Status: DC
  Administered 2022-12-01: 40 mg via SUBCUTANEOUS

## 2022-11-30 MED ORDER — KETOROLAC TROMETHAMINE 15 MG/ML IJ SOLN
INTRAMUSCULAR | Status: AC
  Filled 2022-11-30: qty 1

## 2022-11-30 MED ORDER — ACETAMINOPHEN 650 MG RE SUPP: 650.0000 mg | RECTAL | Status: DC | PRN

## 2022-11-30 MED ORDER — METHYLPREDNISOLONE ACETATE 40 MG/ML IJ SUSP
INTRAMUSCULAR | Status: DC | PRN
  Administered 2022-11-30: 40 mg

## 2022-11-30 MED ORDER — GLIPIZIDE ER 5 MG PO TB24
5.0000 mg | ORAL_TABLET | Freq: Every day | ORAL | Status: DC
  Filled 2022-11-30: qty 1

## 2022-11-30 MED ORDER — FENTANYL CITRATE (PF) 100 MCG/2ML IJ SOLN: 25.0000 ug | INTRAMUSCULAR | Status: DC | PRN

## 2022-11-30 MED ORDER — FENTANYL CITRATE (PF) 100 MCG/2ML IJ SOLN
INTRAMUSCULAR | Status: AC
  Filled 2022-11-30: qty 2

## 2022-11-30 MED ORDER — OXYCODONE HCL 5 MG PO TABS: 5.0000 mg | ORAL_TABLET | ORAL | Status: DC | PRN

## 2022-11-30 MED ORDER — SUCCINYLCHOLINE CHLORIDE 200 MG/10ML IV SOSY
PREFILLED_SYRINGE | INTRAVENOUS | Status: DC | PRN
  Administered 2022-11-30: 120 mg via INTRAVENOUS

## 2022-11-30 MED ORDER — INSULIN ASPART 100 UNIT/ML IJ SOLN
INTRAMUSCULAR | Status: AC
  Filled 2022-11-30: qty 1

## 2022-11-30 MED ORDER — INSULIN ASPART 100 UNIT/ML IJ SOLN
0.0000 [IU] | Freq: Every day | INTRAMUSCULAR | Status: DC
  Administered 2022-11-30: 4 [IU] via SUBCUTANEOUS

## 2022-11-30 MED ORDER — BUPIVACAINE-EPINEPHRINE (PF) 0.5% -1:200000 IJ SOLN
INTRAMUSCULAR | Status: AC
  Filled 2022-11-30: qty 10

## 2022-11-30 MED ORDER — ONDANSETRON HCL 4 MG/2ML IJ SOLN
INTRAMUSCULAR | Status: AC
  Filled 2022-11-30: qty 2

## 2022-11-30 MED ORDER — BUPIVACAINE HCL (PF) 0.5 % IJ SOLN
INTRAMUSCULAR | Status: DC | PRN
  Administered 2022-11-30: 20 mL

## 2022-11-30 MED ORDER — PHENYLEPHRINE HCL-NACL 20-0.9 MG/250ML-% IV SOLN
INTRAVENOUS | Status: AC
  Filled 2022-11-30: qty 250

## 2022-11-30 MED ORDER — ACETAMINOPHEN 500 MG PO TABS
ORAL_TABLET | ORAL | Status: AC
  Filled 2022-11-30: qty 2

## 2022-11-30 MED ORDER — SENNA 8.6 MG PO TABS
1.0000 | ORAL_TABLET | Freq: Two times a day (BID) | ORAL | Status: DC
  Administered 2022-11-30 – 2022-12-01 (×2): 8.6 mg via ORAL

## 2022-11-30 MED ORDER — ONDANSETRON HCL 4 MG/2ML IJ SOLN: 4.0000 mg | Freq: Four times a day (QID) | INTRAMUSCULAR | Status: DC | PRN

## 2022-11-30 MED ORDER — DOCUSATE SODIUM 100 MG PO CAPS
ORAL_CAPSULE | ORAL | Status: AC
  Filled 2022-11-30: qty 1

## 2022-11-30 MED ORDER — BUPIVACAINE HCL (PF) 0.5 % IJ SOLN
INTRAMUSCULAR | Status: AC
  Filled 2022-11-30: qty 30

## 2022-11-30 MED ORDER — POLYETHYLENE GLYCOL 3350 17 G PO PACK: 17.0000 g | PACK | Freq: Every day | ORAL | Status: DC | PRN

## 2022-11-30 MED ORDER — CEFAZOLIN SODIUM-DEXTROSE 2-4 GM/100ML-% IV SOLN
INTRAVENOUS | Status: AC
  Filled 2022-11-30: qty 100

## 2022-11-30 MED ORDER — BUPIVACAINE LIPOSOME 1.3 % IJ SUSP
INTRAMUSCULAR | Status: DC | PRN
Start: 2022-11-30 — End: 2022-11-30
  Administered 2022-11-30: 20 mL

## 2022-11-30 MED ORDER — ACETAMINOPHEN 500 MG PO TABS
1000.0000 mg | ORAL_TABLET | Freq: Four times a day (QID) | ORAL | Status: DC
  Administered 2022-11-30 – 2022-12-01 (×4): 1000 mg via ORAL

## 2022-11-30 MED ORDER — TRIAMCINOLONE ACETONIDE 0.1 % EX CREA
TOPICAL_CREAM | Freq: Two times a day (BID) | CUTANEOUS | Status: DC
  Administered 2022-11-30: 1 via TOPICAL
  Filled 2022-11-30: qty 15

## 2022-11-30 MED ORDER — METHOCARBAMOL 1000 MG/10ML IJ SOLN
500.0000 mg | Freq: Four times a day (QID) | INTRAVENOUS | Status: DC | PRN
  Filled 2022-11-30: qty 5

## 2022-11-30 MED ORDER — METHYLPREDNISOLONE ACETATE 40 MG/ML IJ SUSP
INTRAMUSCULAR | Status: AC
  Filled 2022-11-30: qty 1

## 2022-11-30 MED ORDER — MAGNESIUM CITRATE PO SOLN: 1.0000 | Freq: Once | ORAL | Status: DC | PRN

## 2022-11-30 MED ORDER — SUCCINYLCHOLINE CHLORIDE 200 MG/10ML IV SOSY
PREFILLED_SYRINGE | INTRAVENOUS | Status: AC
  Filled 2022-11-30: qty 10

## 2022-11-30 MED ORDER — DULOXETINE HCL 30 MG PO CPEP
30.0000 mg | ORAL_CAPSULE | Freq: Every day | ORAL | Status: DC
  Administered 2022-12-01: 30 mg via ORAL
  Filled 2022-11-30: qty 1

## 2022-11-30 MED ORDER — CHLORHEXIDINE GLUCONATE 0.12 % MT SOLN
OROMUCOSAL | Status: AC
  Filled 2022-11-30: qty 15

## 2022-11-30 MED ORDER — HYDROMORPHONE HCL 1 MG/ML IJ SOLN
INTRAMUSCULAR | Status: DC | PRN
  Administered 2022-11-30 (×2): .5 mg via INTRAVENOUS

## 2022-11-30 MED ORDER — PROPOFOL 10 MG/ML IV BOLUS
INTRAVENOUS | Status: AC
  Filled 2022-11-30: qty 40

## 2022-11-30 MED ORDER — PHENYLEPHRINE HCL-NACL 20-0.9 MG/250ML-% IV SOLN
INTRAVENOUS | Status: DC | PRN
Start: 2022-11-30 — End: 2022-11-30
  Administered 2022-11-30: 25 ug/min via INTRAVENOUS

## 2022-11-30 MED ORDER — LIDOCAINE HCL (PF) 2 % IJ SOLN
INTRAMUSCULAR | Status: AC
  Filled 2022-11-30: qty 5

## 2022-11-30 MED ORDER — METHOCARBAMOL 500 MG PO TABS: 500.0000 mg | ORAL_TABLET | Freq: Four times a day (QID) | ORAL | Status: DC | PRN

## 2022-11-30 MED ORDER — LIDOCAINE HCL (CARDIAC) PF 100 MG/5ML IV SOSY
PREFILLED_SYRINGE | INTRAVENOUS | Status: DC | PRN
  Administered 2022-11-30: 100 mg via INTRAVENOUS

## 2022-11-30 MED ORDER — MENTHOL 3 MG MT LOZG: 1.0000 | LOZENGE | OROMUCOSAL | Status: DC | PRN

## 2022-11-30 MED ORDER — DROPERIDOL 2.5 MG/ML IJ SOLN: 0.6250 mg | Freq: Once | INTRAMUSCULAR | Status: DC | PRN

## 2022-11-30 MED ORDER — SODIUM CHLORIDE 0.9% FLUSH
3.0000 mL | Freq: Two times a day (BID) | INTRAVENOUS | Status: DC
  Administered 2022-11-30 (×2): 3 mL via INTRAVENOUS

## 2022-11-30 MED ORDER — DEXAMETHASONE SODIUM PHOSPHATE 10 MG/ML IJ SOLN
INTRAMUSCULAR | Status: DC | PRN
  Administered 2022-11-30: 10 mg via INTRAVENOUS

## 2022-11-30 MED ORDER — ACETAMINOPHEN 325 MG PO TABS: 650.0000 mg | ORAL_TABLET | ORAL | Status: DC | PRN

## 2022-11-30 MED ORDER — METFORMIN HCL ER 750 MG PO TB24
750.0000 mg | ORAL_TABLET | Freq: Every day | ORAL | Status: DC
  Administered 2022-11-30: 750 mg via ORAL
  Filled 2022-11-30 (×2): qty 1

## 2022-11-30 MED ORDER — SODIUM CHLORIDE 0.9 % IV SOLN: INTRAVENOUS | Status: DC

## 2022-11-30 MED ORDER — HYDROCHLOROTHIAZIDE 25 MG PO TABS
25.0000 mg | ORAL_TABLET | Freq: Every day | ORAL | Status: DC
  Administered 2022-12-01: 25 mg via ORAL

## 2022-11-30 MED ORDER — SODIUM CHLORIDE 0.9 % IV SOLN: 250.0000 mL | INTRAVENOUS | Status: DC

## 2022-11-30 MED ORDER — SODIUM CHLORIDE 0.9% FLUSH
INTRAVENOUS | Status: DC | PRN
  Administered 2022-11-30: 20 mL via INTRAVENOUS

## 2022-11-30 MED ORDER — SENNA 8.6 MG PO TABS
ORAL_TABLET | ORAL | Status: AC
  Filled 2022-11-30: qty 1

## 2022-11-30 MED ORDER — SURGIFLO WITH THROMBIN (HEMOSTATIC MATRIX KIT) OPTIME
TOPICAL | Status: DC | PRN
  Administered 2022-11-30: 1 via TOPICAL

## 2022-11-30 MED ORDER — FENTANYL CITRATE (PF) 100 MCG/2ML IJ SOLN
INTRAMUSCULAR | Status: DC | PRN
  Administered 2022-11-30 (×2): 50 ug via INTRAVENOUS

## 2022-11-30 MED ORDER — LISINOPRIL-HYDROCHLOROTHIAZIDE 20-12.5 MG PO TABS: 2.0000 | ORAL_TABLET | Freq: Every day | ORAL | Status: DC

## 2022-11-30 MED ORDER — FAMOTIDINE 20 MG PO TABS
ORAL_TABLET | ORAL | Status: AC
  Filled 2022-11-30: qty 1

## 2022-11-30 MED ORDER — MORPHINE SULFATE (PF) 4 MG/ML IV SOLN: 1.0000 mg | INTRAVENOUS | Status: AC | PRN

## 2022-11-30 MED ORDER — OXYCODONE HCL 5 MG PO TABS: 10.0000 mg | ORAL_TABLET | ORAL | Status: DC | PRN

## 2022-11-30 MED ORDER — DEXAMETHASONE SODIUM PHOSPHATE 10 MG/ML IJ SOLN
INTRAMUSCULAR | Status: AC
  Filled 2022-11-30: qty 1

## 2022-11-30 MED ORDER — ACETAMINOPHEN 10 MG/ML IV SOLN
INTRAVENOUS | Status: DC | PRN
  Administered 2022-11-30: 1000 mg via INTRAVENOUS

## 2022-11-30 MED ORDER — PHENYLEPHRINE HCL (PRESSORS) 10 MG/ML IV SOLN
INTRAVENOUS | Status: DC | PRN
  Administered 2022-11-30 (×3): 80 ug via INTRAVENOUS

## 2022-11-30 MED ORDER — KETOROLAC TROMETHAMINE 15 MG/ML IJ SOLN
7.5000 mg | Freq: Four times a day (QID) | INTRAMUSCULAR | Status: AC
  Administered 2022-11-30 – 2022-12-01 (×4): 7.5 mg via INTRAVENOUS

## 2022-11-30 MED ORDER — LISINOPRIL 20 MG PO TABS
40.0000 mg | ORAL_TABLET | Freq: Every day | ORAL | Status: DC
  Administered 2022-12-01: 40 mg via ORAL

## 2022-11-30 MED ORDER — BUPIVACAINE LIPOSOME 1.3 % IJ SUSP
INTRAMUSCULAR | Status: AC
  Filled 2022-11-30: qty 20

## 2022-11-30 MED ORDER — ACETAMINOPHEN 10 MG/ML IV SOLN
INTRAVENOUS | Status: AC
  Filled 2022-11-30: qty 100

## 2022-11-30 MED ORDER — PHENOL 1.4 % MT LIQD: 1.0000 | OROMUCOSAL | Status: DC | PRN

## 2022-11-30 MED ORDER — ONDANSETRON HCL 4 MG PO TABS: 4.0000 mg | ORAL_TABLET | Freq: Four times a day (QID) | ORAL | Status: DC | PRN

## 2022-11-30 MED ORDER — SODIUM CHLORIDE 0.9% FLUSH: 3.0000 mL | INTRAVENOUS | Status: DC | PRN

## 2022-11-30 MED ORDER — INSULIN ASPART 100 UNIT/ML IJ SOLN
0.0000 [IU] | Freq: Three times a day (TID) | INTRAMUSCULAR | Status: DC
  Administered 2022-11-30: 5 [IU] via SUBCUTANEOUS
  Administered 2022-11-30: 15 [IU] via SUBCUTANEOUS
  Administered 2022-12-01 (×3): 3 [IU] via SUBCUTANEOUS

## 2022-11-30 MED ORDER — AMLODIPINE BESYLATE 5 MG PO TABS
5.0000 mg | ORAL_TABLET | Freq: Every day | ORAL | Status: DC
  Administered 2022-12-01: 5 mg via ORAL

## 2022-11-30 MED ORDER — SODIUM CHLORIDE FLUSH 0.9 % IV SOLN
INTRAVENOUS | Status: AC
  Filled 2022-11-30: qty 20

## 2022-11-30 MED ORDER — ONDANSETRON HCL 4 MG/2ML IJ SOLN
INTRAMUSCULAR | Status: DC | PRN
  Administered 2022-11-30: 4 mg via INTRAVENOUS

## 2022-11-30 MED ORDER — PROPOFOL 10 MG/ML IV BOLUS
INTRAVENOUS | Status: DC | PRN
  Administered 2022-11-30: 160 mg via INTRAVENOUS

## 2022-11-30 MED ORDER — HYDROMORPHONE HCL 1 MG/ML IJ SOLN
INTRAMUSCULAR | Status: AC
  Filled 2022-11-30: qty 1

## 2022-11-30 MED ORDER — DOCUSATE SODIUM 100 MG PO CAPS
100.0000 mg | ORAL_CAPSULE | Freq: Two times a day (BID) | ORAL | Status: DC
  Administered 2022-11-30 – 2022-12-01 (×2): 100 mg via ORAL

## 2022-11-30 SURGICAL SUPPLY — 42 items
ADH SKN CLS APL DERMABOND .7 (GAUZE/BANDAGES/DRESSINGS) ×1
AGENT HMST KT MTR STRL THRMB (HEMOSTASIS) ×1
BASIN KIT SINGLE STR (MISCELLANEOUS) ×1 IMPLANT
BUR NEURO DRILL SOFT 3.0X3.8M (BURR) ×1 IMPLANT
CNTNR URN SCR LID CUP LEK RST (MISCELLANEOUS) ×1 IMPLANT
CONT SPEC 4OZ STRL OR WHT (MISCELLANEOUS)
DERMABOND ADVANCED .7 DNX12 (GAUZE/BANDAGES/DRESSINGS) ×1 IMPLANT
DRAPE C ARM PK CFD 31 SPINE (DRAPES) ×1 IMPLANT
DRAPE LAPAROTOMY 100X77 ABD (DRAPES) ×1 IMPLANT
DRAPE MICROSCOPE SPINE 48X150 (DRAPES) IMPLANT
ELECT EZSTD 165MM 6.5IN (MISCELLANEOUS) ×1
ELECT REM PT RETURN 9FT ADLT (ELECTROSURGICAL) ×1
ELECTRODE EZSTD 165MM 6.5IN (MISCELLANEOUS) ×1 IMPLANT
ELECTRODE REM PT RTRN 9FT ADLT (ELECTROSURGICAL) ×1 IMPLANT
EVACUATOR 1/8 PVC DRAIN (DRAIN) IMPLANT
GLOVE BIOGEL PI IND STRL 6.5 (GLOVE) ×1 IMPLANT
GLOVE SURG SYN 6.5 ES PF (GLOVE) ×1 IMPLANT
GLOVE SURG SYN 6.5 PF PI (GLOVE) ×1 IMPLANT
GLOVE SURG SYN 8.5 E (GLOVE) ×3 IMPLANT
GLOVE SURG SYN 8.5 PF PI (GLOVE) ×3 IMPLANT
GOWN SRG LRG LVL 4 IMPRV REINF (GOWNS) ×1 IMPLANT
GOWN SRG XL LVL 3 NONREINFORCE (GOWNS) ×1 IMPLANT
GOWN STRL NON-REIN TWL XL LVL3 (GOWNS) ×1
GOWN STRL REIN LRG LVL4 (GOWNS) ×1
GRAFT DURAGEN MATRIX 1WX1L (Tissue) IMPLANT
KIT SPINAL PRONEVIEW (KITS) ×1 IMPLANT
MANIFOLD NEPTUNE II (INSTRUMENTS) ×1 IMPLANT
MARKER SKIN DUAL TIP RULER LAB (MISCELLANEOUS) ×1 IMPLANT
NDL SAFETY ECLIP 18X1.5 (MISCELLANEOUS) ×1 IMPLANT
NS IRRIG 1000ML POUR BTL (IV SOLUTION) ×1 IMPLANT
NS IRRIG 500ML POUR BTL (IV SOLUTION) IMPLANT
PACK LAMINECTOMY ARMC (PACKS) ×1 IMPLANT
SURGIFLO W/THROMBIN 8M KIT (HEMOSTASIS) ×1 IMPLANT
SUT DVC VLOC 3-0 CL 6 P-12 (SUTURE) ×1 IMPLANT
SUT VIC AB 0 CT1 27 (SUTURE) ×1
SUT VIC AB 0 CT1 27XCR 8 STRN (SUTURE) ×1 IMPLANT
SUT VIC AB 2-0 CT1 18 (SUTURE) ×1 IMPLANT
SYR 30ML LL (SYRINGE) ×2 IMPLANT
SYR 3ML LL SCALE MARK (SYRINGE) ×1 IMPLANT
TRAP FLUID SMOKE EVACUATOR (MISCELLANEOUS) ×1 IMPLANT
WATER STERILE IRR 1000ML POUR (IV SOLUTION) ×2 IMPLANT
WATER STERILE IRR 500ML POUR (IV SOLUTION) IMPLANT

## 2022-11-30 NOTE — Interval H&P Note (Signed)
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  The various methods of treatment have been discussed with the patient and family. After consideration of risks, benefits and other options for treatment, the patient has consented to  Procedure(s): L3-S1 POSTERIOR SPINAL DECOMPRESSION (N/A) as a surgical intervention.  The patient's history has been reviewed, patient examined, no change in status, stable for surgery.  I have reviewed the patient's chart and labs.  Questions were answered to the patient's satisfaction.    Heart sounds normal no MRG. Chest Clear to Auscultation Bilaterally.   [name]

## 2022-11-30 NOTE — Transfer of Care (Signed)
Immediate Anesthesia Transfer of Care Note  Patient: Frank Vega  Procedure(s) Performed: L3-S1 POSTERIOR SPINAL DECOMPRESSION (Back)  Patient Location: PACU  Anesthesia Type:General  Level of Consciousness: drowsy  Airway & Oxygen Therapy: Patient Spontanous Breathing and Patient connected to face mask oxygen  Post-op Assessment: Report given to RN and Post -op Vital signs reviewed and stable  Post vital signs: Reviewed and stable  Last Vitals:  Vitals Value Taken Time  BP 119/60 11/30/22 0945  Temp    Pulse 72 11/30/22 0947  Resp 17 11/30/22 0947  SpO2 98 % 11/30/22 0947  Vitals shown include unfiled device data.  Last Pain:  Vitals:   11/30/22 0617  TempSrc: Temporal  PainSc: 2          Complications: No notable events documented.

## 2022-11-30 NOTE — Plan of Care (Signed)
  Problem: Skin Integrity: Goal: Risk for impaired skin integrity will decrease Outcome: Progressing   Problem: Pain Management: Goal: Pain level will decrease Outcome: Progressing

## 2022-11-30 NOTE — Discharge Instructions (Signed)
Your surgeon has performed an operation on your lumbar spine (low back) to relieve pressure on one or more nerves. Many times, patients feel better immediately after surgery and can "overdo it." Even if you feel well, it is important that you follow these activity guidelines. If you do not let your back heal properly from the surgery, you can increase the chance of a disc herniation and/or return of your symptoms. The following are instructions to help in your recovery once you have been discharged from the hospital.  * It is ok to take NSAIDs after surgery.  Activity    No bending, lifting, or twisting ("BLT"). Avoid lifting objects heavier than 10 pounds (gallon milk jug).  Where possible, avoid household activities that involve lifting, bending, pushing, or pulling such as laundry, vacuuming, grocery shopping, and childcare. Try to arrange for help from friends and family for these activities while your back heals.  Increase physical activity slowly as tolerated.  Taking short walks is encouraged, but avoid strenuous exercise. Do not jog, run, bicycle, lift weights, or participate in any other exercises unless specifically allowed by your doctor. Avoid prolonged sitting, including car rides.  Talk to your doctor before resuming sexual activity.  You should not drive until cleared by your doctor.  Until released by your doctor, you should not return to work or school.  You should rest at home and let your body heal.   You may shower three days after your surgery.  After showering, lightly dab your incision dry. Do not take a tub bath or go swimming for 3 weeks, or until approved by your doctor at your follow-up appointment.  If you smoke, we strongly recommend that you quit.  Smoking has been proven to interfere with normal healing in your back and will dramatically reduce the success rate of your surgery. Please contact [facility] (800-QUIT-NOW) and use the resources at www.[facility].com for  assistance in stopping smoking.  Surgical Incision   If you have a dressing on your incision, you may remove it three days after your surgery. Keep your incision area clean and dry.  If you have staples or stitches on your incision, you should have a follow up scheduled for removal. If you do not have staples or stitches, you will have steri-strips (small pieces of surgical tape) or Dermabond glue. The steri-strips/glue should begin to peel away within about a week (it is fine if the steri-strips fall off before then). If the strips are still in place one week after your surgery, you may gently remove them.  Diet            You may return to your usual diet. Be sure to stay hydrated.  When to Contact Frank Vega  Although your surgery and recovery will likely be uneventful, you may have some residual numbness, aches, and pains in your back and/or legs. This is normal and should improve in the next few weeks.  However, should you experience any of the following, contact Frank Vega immediately: New numbness or weakness Pain that is progressively getting worse, and is not relieved by your pain medications or rest Bleeding, redness, swelling, pain, or drainage from surgical incision Chills or flu-like symptoms Fever greater than 101.0 F (38.3 C) Problems with bowel or bladder functions Difficulty breathing or shortness of breath Warmth, tenderness, or swelling in your calf  Contact Information How to contact Frank Vega:  If you have any questions/concerns before or after surgery, you can reach Frank Vega at [phone], or you can  send a [employer]. We can be reached by phone or mychart 8am-4pm, Monday-Friday.  *Please note: Calls after 4pm are forwarded to a third party answering service. Mychart messages are not routinely monitored during evenings, weekends, and holidays. Please call our office to contact the answering service for urgent concerns during non-business hours.

## 2022-11-30 NOTE — Anesthesia Procedure Notes (Signed)
Procedure Name: Intubation Date/Time: 11/30/2022 7:36 AM  Performed by: Malva Cogan, CRNAPre-anesthesia Checklist: Patient identified, Patient being monitored, Timeout performed, Emergency Drugs available and Suction available Patient Re-evaluated:Patient Re-evaluated prior to induction Oxygen Delivery Method: Circle system utilized Preoxygenation: Pre-oxygenation with 100% oxygen Induction Type: IV induction Ventilation: Mask ventilation without difficulty and Oral airway inserted - appropriate to patient size Laryngoscope Size: McGraph and 4 Grade View: Grade I Tube type: Oral Tube size: 7.0 mm Number of attempts: 1 Airway Equipment and Method: Stylet Placement Confirmation: ETT inserted through vocal cords under direct vision, positive ETCO2 and breath sounds checked- equal and bilateral Secured at: 23 cm Tube secured with: Tape Dental Injury: Teeth and Oropharynx as per pre-operative assessment

## 2022-11-30 NOTE — Anesthesia Preprocedure Evaluation (Signed)
Anesthesia Evaluation  Patient identified by MRN, date of birth, ID band Patient awake    Reviewed: Allergy & Precautions, H&P , NPO status , Patient's Chart, lab work & pertinent test results, reviewed documented beta blocker date and time   History of Anesthesia Complications Negative for: history of anesthetic complications  Airway Mallampati: III  TM Distance: >3 FB Neck ROM: full    Dental  (+) Dental Advidsory Given, Partial Upper   Pulmonary neg pulmonary ROS          Cardiovascular Exercise Tolerance: Good hypertension, (-) angina (-) CAD, (-) Past MI, (-) Cardiac Stents and (-) CABG (-) dysrhythmias (-) Valvular Problems/Murmurs     Neuro/Psych  PSYCHIATRIC DISORDERS  Depression    negative neurological ROS     GI/Hepatic Neg liver ROS,GERD  ,,  Endo/Other  diabetes, Oral Hypoglycemic Agents    Renal/GU negative Renal ROS  negative genitourinary   Musculoskeletal   Abdominal   Peds  Hematology negative hematology ROS (+)   Anesthesia Other Findings Past Medical History: ~2010: Depression     Comment:  situational and now better  No date: Diabetes mellitus with polyneuropathy (HCC) No date: GERD (gastroesophageal reflux disease) No date: Hypertension No date: IBS (irritable bowel syndrome) No date: Osteoarthritis of multiple joints   Reproductive/Obstetrics negative OB ROS                             Anesthesia Physical Anesthesia Plan  ASA: 2  Anesthesia Plan: General   Post-op Pain Management:    Induction: Intravenous  PONV Risk Score and Plan: 2 and Ondansetron, Dexamethasone and Treatment may vary due to age or medical condition  Airway Management Planned: Oral ETT  Additional Equipment:   Intra-op Plan:   Post-operative Plan: Extubation in OR  Informed Consent: I have reviewed the patients History and Physical, chart, labs and discussed the procedure  including the risks, benefits and alternatives for the proposed anesthesia with the patient or authorized representative who has indicated his/her understanding and acceptance.     Dental Advisory Given  Plan Discussed with: Anesthesiologist, CRNA and Surgeon  Anesthesia Plan Comments:         Anesthesia Quick Evaluation

## 2022-11-30 NOTE — Op Note (Signed)
Indications: [name] is suffering from lumbar stenosis causing neurogenic claudication (ICD10 M48.062). The patient tried and failed conservative management, prompting surgical intervention.  Findings: lumbar stenosis  Preoperative Diagnosis: Lumbar Stenosis with neurogenic claudication Postoperative Diagnosis: same   EBL: 10 ml IVF:see anesthesia record Drains: one Disposition: Extubated and Stable to PACU Complications: none  No foley catheter was placed.   Preoperative Note:  Risks of surgery discussed include: infection, bleeding, stroke, coma, death, paralysis, CSF leak, nerve/spinal cord injury, numbness, tingling, weakness, complex regional pain syndrome, recurrent stenosis and/or disc herniation, vascular injury, development of instability, neck/back pain, need for further surgery, persistent symptoms, development of deformity, and the risks of anesthesia. The patient understood these risks and agreed to proceed.  Operative Note:   1. L3-S1 lumbar decompression including central laminectomy and Right medial facetectomies including foraminotomies  The patient was then brought from the preoperative center with intravenous access established.  The patient underwent general anesthesia and endotracheal tube intubation, and was then rotated on the [place] rail top where all pressure points were appropriately padded.  The skin was then thoroughly cleansed.  Perioperative antibiotic prophylaxis was administered.  Sterile prep and drapes were then applied and a timeout was then observed.  C-arm was brought into the field under sterile conditions and under lateral visualization the L3-4 through L5/S1 interspaces were identified and marked.  The incision was marked on the right and injected with local anesthetic. Once this was complete a 5 cm incision was opened with the use of a #10 blade knife.    The metrx tubes were sequentially advanced and confirmed in position at L5-S1. An  18mm by 70mm tube was locked in place to the bed side attachment.  The microscope was then sterilely brought into the field and muscle creep was hemostased with a bipolar and resected with a pituitary rongeur.  A Bovie extender was then used to expose the spinous process and lamina.  Careful attention was placed to not violate the facet capsule. A 3 mm matchstick drill bit was then used to make a hemi-laminotomy trough until the ligamentum flavum was exposed.  This was extended to the base of the spinous process and to the contralateral side to remove all the central bone from each side.  Once this was complete and the underlying ligamentum flavum was visualized, it was dissected with a curette and resected with Kerrison rongeurs.  Extensive ligamentum hypertrophy was noted, requiring a substantial amount of time and care for removal.  The dura was identified and palpated. The kerrison rongeur was then used to remove the medial facet bilaterally until no compression was noted.  A balltip probe was used to confirm decompression of the ipsilateral S1 nerve root.  No CSF leak was noted.  Depo-Medrol was placed on the nerve root.  The wound was copiously irrigated. The tube system was then removed under microscopic visualization and hemostasis was obtained with a bipolar.    After performing the decompression at L5-S1, the metrx tubes were sequentially advanced and confirmed in position at L4-5. An 18mm by 70mm tube was locked in place to the bed side attachment.  Fluoroscopy was then removed from the field.  The microscope was then sterilely brought into the field and muscle creep was hemostased with a bipolar and resected with a pituitary rongeur.  A Bovie extender was then used to expose the spinous process and lamina.  Careful attention was placed to not violate the facet capsule. A 3 mm matchstick drill  bit was then used to make a hemi-laminotomy trough until the ligamentum flavum was exposed.  This was  extended to the base of the spinous process and to the contralateral side to remove all the central bone from each side.  Once this was complete and the underlying ligamentum flavum was visualized, it was dissected with a curette and resected with Kerrison rongeurs.  Extensive ligamentum hypertrophy was noted, requiring a substantial amount of time and care for removal.  The dura was identified and palpated. The kerrison rongeur was then used to remove the medial facet bilaterally until no compression was noted.  A balltip probe was used to confirm decompression of the ipsilateral L5 nerve root.  No CSF leak was noted.  Depo-Medrol was placed on the nerve root.   The wound was copiously irrigated. The tube system was then removed under microscopic visualization and hemostasis was obtained with a bipolar.    After performing the decompression at L4-5, the metrx tubes were sequentially advanced and confirmed in position at L3-4. An 18mm by 70mm tube was locked in place to the bed side attachment.  Fluoroscopy was then removed from the field.  The microscope was then sterilely brought into the field and muscle creep was hemostased with a bipolar and resected with a pituitary rongeur.  A Bovie extender was then used to expose the spinous process and lamina.  Careful attention was placed to not violate the facet capsule. A 3 mm matchstick drill bit was then used to make a hemi-laminotomy trough until the ligamentum flavum was exposed.  This was extended to the base of the spinous process and to the contralateral side to remove all the central bone from each side.  Once this was complete and the underlying ligamentum flavum was visualized, it was dissected with a curette and resected with Kerrison rongeurs.  Extensive ligamentum hypertrophy was noted, requiring a substantial amount of time and care for removal.  The dura was identified and palpated. The kerrison rongeur was then used to remove the medial facet  bilaterally until no compression was noted.  A balltip probe was used to confirm decompression of the ipsilateral L4 nerve root.  Additional attention was paid to completion of the contralateral foraminotomy until the contralateral L4 nerve root was completely free.  Once this was complete, L3-4 central decompression including medial facetectomy and foraminotomy was confirmed and decompression on both sides was confirmed. No CSF leak was noted.  Depo-Medrol was placed on the nerve root.   The wound was copiously irrigated. The tube system was then removed under microscopic visualization and hemostasis was obtained with a bipolar.    A drain was placed.   The fascial layer was reapproximated with the use of a 0 Vicryl suture.  Subcutaneous tissue layer was reapproximated using 2-0 Vicryl suture.  3-0 monocryl was placed in subcuticular fashion. The skin was then cleansed and Dermabond was used to close the skin opening.  Patient was then rotated back to the preoperative bed awakened from anesthesia and taken to recovery all counts are correct in this case.  I performed the entire procedure with the assistance of Manning Charity PA as an Designer, television/film set. An assistant was required for this procedure due to the complexity.  The assistant provided assistance in tissue manipulation and suction, and was required for the successful and safe performance of the procedure. I performed the critical portions of the procedure.   Georgian Mcclory K. Myer Haff MD

## 2022-12-01 DIAGNOSIS — Z96652 Presence of left artificial knee joint: Secondary | ICD-10-CM | POA: Diagnosis not present

## 2022-12-01 DIAGNOSIS — M48062 Spinal stenosis, lumbar region with neurogenic claudication: Secondary | ICD-10-CM | POA: Diagnosis not present

## 2022-12-01 DIAGNOSIS — E119 Type 2 diabetes mellitus without complications: Secondary | ICD-10-CM | POA: Diagnosis not present

## 2022-12-01 DIAGNOSIS — Z96641 Presence of right artificial hip joint: Secondary | ICD-10-CM | POA: Diagnosis not present

## 2022-12-01 DIAGNOSIS — I10 Essential (primary) hypertension: Secondary | ICD-10-CM | POA: Diagnosis not present

## 2022-12-01 DIAGNOSIS — Z91040 Latex allergy status: Secondary | ICD-10-CM | POA: Diagnosis not present

## 2022-12-01 LAB — GLUCOSE, CAPILLARY
Glucose-Capillary: 156 mg/dL — ABNORMAL HIGH (ref 70–99)
Glucose-Capillary: 157 mg/dL — ABNORMAL HIGH (ref 70–99)
Glucose-Capillary: 158 mg/dL — ABNORMAL HIGH (ref 70–99)

## 2022-12-01 MED ORDER — DOCUSATE SODIUM 100 MG PO CAPS
ORAL_CAPSULE | ORAL | Status: AC
  Filled 2022-12-01: qty 1

## 2022-12-01 MED ORDER — SENNA 8.6 MG PO TABS
ORAL_TABLET | ORAL | Status: AC
  Filled 2022-12-01: qty 1

## 2022-12-01 MED ORDER — OXYCODONE HCL 5 MG PO TABS: 5.0000 mg | ORAL_TABLET | ORAL | 0 refills | Status: DC | PRN

## 2022-12-01 MED ORDER — HYDROCHLOROTHIAZIDE 25 MG PO TABS
ORAL_TABLET | ORAL | Status: AC
  Filled 2022-12-01: qty 1

## 2022-12-01 MED ORDER — ACETAMINOPHEN 500 MG PO TABS
ORAL_TABLET | ORAL | Status: AC
  Filled 2022-12-01: qty 2

## 2022-12-01 MED ORDER — INSULIN ASPART 100 UNIT/ML IJ SOLN
INTRAMUSCULAR | Status: AC
  Filled 2022-12-01: qty 1

## 2022-12-01 MED ORDER — KETOROLAC TROMETHAMINE 15 MG/ML IJ SOLN
INTRAMUSCULAR | Status: AC
  Filled 2022-12-01: qty 1

## 2022-12-01 MED ORDER — TIZANIDINE HCL 2 MG PO TABS: 2.0000 mg | ORAL_TABLET | Freq: Three times a day (TID) | ORAL | 0 refills | Status: DC | PRN

## 2022-12-01 MED ORDER — AMLODIPINE BESYLATE 5 MG PO TABS
ORAL_TABLET | ORAL | Status: AC
  Filled 2022-12-01: qty 1

## 2022-12-01 MED ORDER — SENNA 8.6 MG PO TABS: 1.0000 | ORAL_TABLET | Freq: Two times a day (BID) | ORAL | 0 refills | Status: AC

## 2022-12-01 MED ORDER — ENOXAPARIN SODIUM 40 MG/0.4ML IJ SOSY
PREFILLED_SYRINGE | INTRAMUSCULAR | Status: AC
  Filled 2022-12-01: qty 0.4

## 2022-12-01 MED ORDER — LISINOPRIL 20 MG PO TABS
ORAL_TABLET | ORAL | Status: AC
  Filled 2022-12-01: qty 2

## 2022-12-01 NOTE — Progress Notes (Signed)
DISCHARGE NOTE:  Pt and daughter given discharge instructions and verbalized understanding. [facility] sent with pt. Pt wheeled to car by staff, daughter providing transportation.

## 2022-12-01 NOTE — TOC Progression Note (Addendum)
Transition of Care [facility]) - Progression Note    Patient Details  Name: Frank Vega MRN: [id number] Date of Birth: 01-Oct-1945  Transition of Care [facility]) CM/SW Contact  Marlowe Sax, RN Phone Number: 12/01/2022, 10:53 AM  Clinical Narrative:     The patient lives alone however Daughter is planning to stay with patient for first few days and will have family/friends to assist after.  Has a rolling walker at home, [employer] to deliver the 3 in1 to the bedside   Expected Discharge Plan: Home/Self Care Barriers to Discharge: No Barriers Identified  Expected Discharge Plan and Services   Discharge Planning Services: CM Consult   Living arrangements for the past 2 months: Single Family Home                 DME Arranged: 3-N-1 DME Agency: [employer] Date DME Agency Contacted: 12/01/22 Time DME Agency Contacted: 3 Representative spoke with at DME Agency: Osvaldo Angst Arranged: NA           Social Determinants of Health (SDOH) Interventions SDOH Screenings   Food Insecurity: Patient Declined (11/30/2022)  Housing: Low Risk  (11/30/2022)  Transportation Needs: No Transportation Needs (11/30/2022)  Utilities: Not At Risk (11/30/2022)  Depression (PHQ2-9): Low Risk  (09/26/2022)  Financial Resource Strain: Low Risk  (06/14/2019)  Tobacco Use: Low Risk  (11/30/2022)    Readmission Risk Interventions     No data to display

## 2022-12-01 NOTE — Discharge Summary (Signed)
Physician Discharge Summary  Patient ID: Frank Vega MRN: [id number] DOB/AGE: 07-13-1945 77 y.o.  Admit date: 11/30/2022 Discharge date: 12/01/2022  Admission Diagnoses:Principal Problem:   Lumbar stenosis Active Problems:   Neurogenic claudication due to lumbar spinal stenosis  Discharge Diagnoses:  Principal Problem:   Lumbar stenosis Active Problems:   Neurogenic claudication due to lumbar spinal stenosis   Discharged Condition: good  Hospital Course: Mr. Kilduff was admitted for surgical intervention.  He did very well with surgery and was stable for discharge on POD1.  Consults: None  Significant Diagnostic Studies: radiology: X-Ray: localization correct  Treatments: surgery: L3-S1 decompression  Discharge Exam: Blood pressure 136/61, pulse 71, temperature 98.2 F (36.8 C), temperature source Oral, resp. rate 18, height 5' 7.5" (1.715 m), weight 101.6 kg, SpO2 96%. General appearance: alert and cooperative  MAEW 5/5  Disposition: Discharge disposition: 01-Home or Self Care       Discharge Instructions     Discharge patient   Complete by: As directed    Discharge disposition: 01-Home or Self Care   Discharge patient date: 12/01/2022   Incentive spirometry RT   Complete by: As directed       Allergies as of 12/01/2022       Reactions   Latex Itching, Rash   Shellfish Allergy Swelling   face   Paxil [paroxetine Hcl]    Weight loss and jittery        Medication List     TAKE these medications    amLODipine 5 MG tablet Commonly known as: NORVASC TAKE 1 TABLET BY MOUTH EVERY DAY   amoxicillin 500 MG capsule Commonly known as: AMOXIL Take all 4 tablets by mouth 1 hour before dental procedure   atorvastatin 10 MG tablet Commonly known as: LIPITOR TAKE 1 TABLET(10 MG) BY MOUTH 1 TIME A WEEK   diphenoxylate-atropine 2.5-0.025 MG tablet Commonly known as: LOMOTIL TAKE 1 TABLET BY MOUTH EVERY DAY AS NEEDED FOR DIARRHEA OR LOOSE STOOLS    DULoxetine 30 MG capsule Commonly known as: CYMBALTA TAKE 1 CAPSULE(30 MG) BY MOUTH DAILY   glipiZIDE 5 MG 24 hr tablet Commonly known as: GLUCOTROL XL TAKE 1 TABLET BY MOUTH EVERY DAY WITH BREAKFAST   lisinopril-hydrochlorothiazide 20-12.5 MG tablet Commonly known as: ZESTORETIC TAKE 2 TABLETS BY MOUTH EVERY DAY   metFORMIN 750 MG 24 hr tablet Commonly known as: GLUCOPHAGE-XR TAKE 1 TABLET BY MOUTH DAILY WITH SUPPER   mometasone 0.1 % ointment Commonly known as: ELOCON Apply topically 2 (two) times daily.   MULTIVITAMIN MEN PO Take 1 tablet by mouth daily.   OneTouch Delica Lancets 30G Misc 1 each by Does not apply route daily. Dx Code E11.42   OneTouch Ultra test strip Generic drug: glucose blood Use to check blood sugar once a day. Dx Code E11.42   oxyCODONE 5 MG immediate release tablet Commonly known as: Oxy IR/ROXICODONE Take 1 tablet (5 mg total) by mouth every 4 (four) hours as needed for moderate pain ((score 4 to 6)).   senna 8.6 MG Tabs tablet Commonly known as: SENOKOT Take 1 tablet (8.6 mg total) by mouth 2 (two) times daily.   tiZANidine 2 MG tablet Commonly known as: ZANAFLEX Take 1 tablet (2 mg total) by mouth every 8 (eight) hours as needed for muscle spasms.   triamcinolone cream 0.1 % Commonly known as: KENALOG APPLY TOPICALLY TO THE AFFECTED AREA TWICE DAILY AS NEEDED  Durable Medical Equipment  (From admission, onward)           Start     Ordered   12/01/22 1045  For home use only DME Bedside commode  Once       Question:  Patient needs a bedside commode to treat with the following condition  Answer:  Impaired mobility   12/01/22 1044            Follow-up Information     Drake Leach, PA-C Follow up on 12/16/2022.   Specialty: Neurosurgery Contact information: [address] [place] [address] [phone]                 Signed: Venetia Night 12/01/2022, 5:12  PM

## 2022-12-01 NOTE — Progress Notes (Signed)
Patient is not able to walk the distance required to go the bathroom, or he/she is unable to safely negotiate stairs required to access the bathroom.  A 3in1 BSC will alleviate this problem  

## 2022-12-01 NOTE — Care Management Obs Status (Signed)
MEDICARE OBSERVATION STATUS NOTIFICATION   Patient Details  Name: Frank Vega MRN: [id number] Date of Birth: [birth year]   Medicare Observation Status Notification Given:  Yes    Marlowe Sax, RN 12/01/2022, 8:52 AM

## 2022-12-01 NOTE — Evaluation (Addendum)
Occupational Therapy Evaluation Patient Details Name: Frank Vega MRN: [id number] DOB: 03-26-1945 Today's Date: 12/01/2022   History of Present Illness 77 y/o male s/p L3-S1 lumbar decompression including central laminectomy and R medial facetectomies including foraminotomies on 11/30/22. PMH: HTN, chronic venous insufficiency, IBS, DM with polyneuropathy.   Clinical Impression   Pt seen for OT evaluation this date, POD#1 from above surgery. Pt eager to work with OT, he was seated in recliner on arrival to room. Prior to hospital admission, pt was independent - MOD I with mobility, ADL, and IADL. Pt states during prolonged standing/walking Pt often needs to lean on something (ex: rest on shopping cart during shopping). Pt lives alone in one level home. At d/c pt states he will have family and friends to assist him as needed. Currently pt needing supervision with all aspects of mobility using RW ~ 41ft. At the start of session Pt recalled 3/3 back precautions and stated log roll steps. Pt reported he felt good with his ability to log roll after working on it earlier in the day. In session Pt completed LB dressing with socks and sweat pants requiring MOD A + sock aid and reacher. Pt was educated in back precautions, self care skills, AE, and home/routines modifications to maximize safety and functional independence while minimizing falls risk and maintaining precautions. Pt verbalized understanding of all education/training provided. Handout provided to support recall and carry over of learned precautions/techniques for bed mobility, functional transfers, and self care skills. Pt would benefit from continued practice with LB dressing + AE. OT will follow acutely.       If plan is discharge home, recommend the following: Assistance with cooking/housework;A little help with bathing/dressing/bathroom    Functional Status Assessment  Patient has had a recent decline in their functional status and  demonstrates the ability to make significant improvements in function in a reasonable and predictable amount of time.  Equipment Recommendations  BSC/3in1;Other (comment) (sock aid)    Recommendations for Other Services       Precautions / Restrictions Precautions Precautions: Back Precaution Booklet Issued: Yes (comment) Precaution Comments:  Required Braces or Orthoses:  (No brace needed per orders) Restrictions Weight Bearing Restrictions: No      Mobility Bed Mobility               General bed mobility comments: Pt seated in recliner on arrival, pt verbally recalled steps for log rolling technique that he learned initally with PT    Transfers Overall transfer level: Modified independent Equipment used: Rolling walker (2 wheels)                      Balance Overall balance assessment: No apparent balance deficits (not formally assessed)                                         ADL either performed or assessed with clinical judgement   ADL Overall ADL's : Needs assistance/impaired                 Upper Body Dressing : Sitting Upper Body Dressing Details (indicate cue type and reason): gown Lower Body Dressing: Moderate assistance;Sit to/from stand;Adhering to back precautions;With adaptive equipment;Set up Lower Body Dressing Details (indicate cue type and reason): Personal sweat pants + reacher, don/doff socks + sock aid +verbal/demonstration cuing needed. Toilet Transfer: Modified Independent;BSC/3in1;Rolling walker (2 wheels);Ambulation  Functional mobility during ADLs: Supervision ;Rolling walker (2 wheels) ~ 58ft       Vision Baseline Vision/History: 1 Wears glasses       Perception         Praxis         Pertinent Vitals/Pain Pain Assessment Pain Assessment: No/denies pain     Extremity/Trunk Assessment Upper Extremity Assessment: Upper Extremity Assessment: Not formally assessed, appears within  functional limits    Lower Extremity Assessment Lower Extremity Assessment: Defer to PT evaluation   Cervical / Trunk Assessment Cervical / Trunk Assessment: Back Surgery   Communication Communication Communication: No apparent difficulties Cueing Techniques: Verbal cues;Tactile cues;Visual cues   Cognition Arousal: Alert Behavior During Therapy: WFL for tasks assessed/performed Overall Cognitive Status: Within Functional Limits for tasks assessed                                       General Comments  Pt wound vac/incision was intact pre/post session    Exercises Other Exercises Other Exercises: Edu: role of OT, role of rehab, safe ADL completion, strategies for imporved LB dressing, safe hand placement during STS with RW   Shoulder Instructions      Home Living Family/patient expects to be discharged to:: Private residence Living Arrangements: Alone Available Help at Discharge: Family;Friend(s);Available 24 hours/day Type of Home: House Home Access: Ramped entrance     Home Layout: One level     Bathroom Shower/Tub: Chief Strategy Officer: Standard     Home Equipment: Agricultural consultant (2 wheels);Cane - single point;Tub bench          Prior Functioning/Environment Prior Level of Function : Independent/Modified Independent             Mobility Comments: independent with no AD, however was unable to stand for prolonged periods of time          OT Problem List: Decreased knowledge of use of DME or AE;Decreased safety awareness;Other (comment) (LB ADL deficits)      OT Treatment/Interventions: Self-care/ADL training    OT Goals(Current goals can be found in the care plan section) Acute Rehab OT Goals Patient Stated Goal: To return home OT Goal Formulation: With patient Time For Goal Achievement: 12/15/22 Potential to Achieve Goals: Good ADL Goals Pt Will Perform Lower Body Dressing: with modified independence;sit to/from  stand;with adaptive equipment Pt Will Transfer to Toilet: with modified independence;bedside commode;ambulating Pt Will Perform Toileting - Clothing Manipulation and hygiene: with modified independence;with adaptive equipment;sitting/lateral leans Pt Will Perform Tub/Shower Transfer: ambulating;with modified independence;tub bench;grab bars  OT Frequency: Min 1X/week    Co-evaluation              AM-PAC OT "6 Clicks" Daily Activity     Outcome Measure Help from another person eating meals?: None Help from another person taking care of personal grooming?: None   Help from another person bathing (including washing, rinsing, drying)?: A Lot Help from another person to put on and taking off regular upper body clothing?: None Help from another person to put on and taking off regular lower body clothing?: A Lot 6 Click Score: 16   End of Session Equipment Utilized During Treatment: Rolling walker (2 wheels) Nurse Communication: Mobility status  Activity Tolerance: Patient tolerated treatment well;No increased pain Patient left: in chair;with call bell/phone within reach  OT Visit Diagnosis: Muscle weakness (generalized) (M62.81);Other abnormalities of gait and  mobility (R26.89)                Time: 1610-9604 OT Time Calculation (min): 20 min Charges:  OT General Charges $OT Visit: 1 Visit OT Evaluation $OT Eval Low Complexity: 1 Low  [address] OTS   Oleta Mouse, OTD OTR/L  12/01/22, 1:40 PM   This entire patient session was guided, instructed, and supervised by this clinician. The note, response to treatment, and overall treatment plan has been reviewed and this clinician agrees with the information provided.   Oleta Mouse, OTR/L  12/01/22,  1:40 PM [phone]

## 2022-12-01 NOTE — Evaluation (Signed)
Physical Therapy Evaluation Patient Details Name: Frank Vega MRN: [id number] DOB: 11-06-1945 Today's Date: 12/01/2022  History of Present Illness  77 y/o male s/p L3-S1 lumbar decompression including central laminectomy and R medial facetectomies including foraminotomies on 11/30/22. PMH: HTN, chronic venous insufficiency, IBS, DM with polyneuropathy.  Clinical Impression  Patient s/p above surgery. PTA, patient lives alone and reports independence with no AD, however unable to stand for prolonged periods of time. Daughter is planning to stay with patient for first few days and will have family/friends to assist after. Patient in recliner on arrival and educated on log roll technique for bed mobility. Able to complete sit to stand modI. Ambulated in hallway with RW and supervision with mild unsteadiness during turning but otherwise steady. Patient will benefit from skilled PT services during acute stay to address listed deficits. Patient will benefit from ongoing therapy at discharge to maximize functional independence and safety.         If plan is discharge home, recommend the following: A little help with bathing/dressing/bathroom   Can travel by private vehicle        Equipment Recommendations None recommended by PT  Recommendations for Other Services       Functional Status Assessment Patient has had a recent decline in their functional status and demonstrates the ability to make significant improvements in function in a reasonable and predictable amount of time.     Precautions / Restrictions Precautions Precautions: Back Precaution Booklet Issued: Yes (comment) Required Braces or Orthoses:  (No brace needed per orders) Restrictions Weight Bearing Restrictions: No      Mobility  Bed Mobility               General bed mobility comments: in recliner on arrival, education provided on log roll technique    Transfers Overall transfer level: Modified  independent Equipment used: Rolling Joran Kallal (2 wheels)                    Ambulation/Gait Ambulation/Gait assistance: Supervision Gait Distance (Feet): 150 Feet Assistive device: Rolling Demiyah Fischbach (2 wheels) Gait Pattern/deviations: Step-through pattern, Decreased stride length Gait velocity: decreased     General Gait Details: supervision for safety. Mild unsteadiness noted with turning. Cues for upright posture  Stairs            Wheelchair Mobility     Tilt Bed    Modified Rankin (Stroke Patients Only)       Balance Overall balance assessment: Mild deficits observed, not formally tested                                           Pertinent Vitals/Pain Pain Assessment Pain Assessment: Faces Faces Pain Scale: Hurts a little bit Pain Location: back Pain Descriptors / Indicators: Discomfort Pain Intervention(s): Limited activity within patient's tolerance, Monitored during session, Repositioned    Home Living Family/patient expects to be discharged to:: Private residence Living Arrangements: Alone Available Help at Discharge: Family;Friend(s);Available 24 hours/day (daughter staying first few days after discharge and family/friends available after) Type of Home: House Home Access: Ramped entrance       Home Layout: One level Home Equipment: Agricultural consultant (2 wheels);Cane - single point;Tub bench      Prior Function Prior Level of Function : Independent/Modified Independent             Mobility Comments: independent with no  AD, however was unable to stand for prolonged periods of time       Extremity/Trunk Assessment   Upper Extremity Assessment Upper Extremity Assessment: Defer to OT evaluation    Lower Extremity Assessment Lower Extremity Assessment: Generalized weakness    Cervical / Trunk Assessment Cervical / Trunk Assessment: Back Surgery  Communication   Communication Communication: No apparent difficulties   Cognition Arousal: Alert Behavior During Therapy: WFL for tasks assessed/performed Overall Cognitive Status: Within Functional Limits for tasks assessed                                          General Comments      Exercises     Assessment/Plan    PT Assessment Patient needs continued PT services  PT Problem List Decreased strength;Decreased balance;Decreased activity tolerance;Decreased mobility;Decreased knowledge of precautions       PT Treatment Interventions DME instruction;Gait training;Functional mobility training;Therapeutic activities;Balance training;Therapeutic exercise;Patient/family education    PT Goals (Current goals can be found in the Care Plan section)  Acute Rehab PT Goals Patient Stated Goal: to go home PT Goal Formulation: With patient Time For Goal Achievement: 12/15/22 Potential to Achieve Goals: Good    Frequency 7X/week     Co-evaluation               AM-PAC PT "6 Clicks" Mobility  Outcome Measure Help needed turning from your back to your side while in a flat bed without using bedrails?: None Help needed moving from lying on your back to sitting on the side of a flat bed without using bedrails?: None Help needed moving to and from a bed to a chair (including a wheelchair)?: None Help needed standing up from a chair using your arms (e.g., wheelchair or bedside chair)?: None Help needed to walk in hospital room?: A Little Help needed climbing 3-5 steps with a railing? : A Little 6 Click Score: 22    End of Session   Activity Tolerance: Patient tolerated treatment well Patient left: in chair;with call bell/phone within reach (OT present for handoff) Nurse Communication: Mobility status PT Visit Diagnosis: Muscle weakness (generalized) (M62.81);Unsteadiness on feet (R26.81)    Time: 1308-6578 PT Time Calculation (min) (ACUTE ONLY): 13 min   Charges:   PT Evaluation $PT Eval Low Complexity: 1 Low   PT General  Charges $$ ACUTE PT VISIT: 1 Visit         Maylon Peppers, PT, DPT Physical Therapist - [facility]  [facility]   Merlene Dante A Heraclio Seidman 12/01/2022, 9:43 AM

## 2022-12-01 NOTE — Plan of Care (Signed)
  Problem: Education: Goal: Ability to verbalize activity precautions or restrictions will improve Outcome: Progressing   Problem: Activity: Goal: Ability to avoid complications of mobility impairment will improve Outcome: Progressing Goal: Ability to tolerate increased activity will improve Outcome: Progressing   Problem: Pain Management: Goal: Pain level will decrease Outcome: Progressing   Problem: Bladder/Genitourinary: Goal: Urinary functional status for postoperative course will improve Outcome: Progressing

## 2022-12-01 NOTE — Inpatient Diabetes Management (Signed)
Inpatient Diabetes Program Recommendations  AACE/ADA: New Consensus Statement on Inpatient Glycemic Control (2015)  Target Ranges:  Prepandial:   less than 140 mg/dL      Peak postprandial:   less than 180 mg/dL (1-2 hours)      Critically ill patients:  140 - 180 mg/dL   Lab Results  Component Value Date   GLUCAP 156 (H) 12/01/2022   HGBA1C 7.2 (H) 09/26/2022    Review of Glycemic Control  Latest Reference Range & Units 11/30/22 06:22 11/30/22 09:48 11/30/22 11:53 11/30/22 17:09 11/30/22 21:47 12/01/22 07:27  Glucose-Capillary 70 - 99 mg/dL 657 (H) 846 (H) 962 (H) 360 (H) 321 (H) 156 (H)   Diabetes history: DM 2 Outpatient Diabetes medications: Glipizide 5 mg Daily, Metformin 750 mg daily at supper Current orders for Inpatient glycemic control:  Glipizide 5 mg Daily Metformin 750 mg Qsupper Novolog 0-15 units tid + hs  A1c 7.2% on 7/22  Inpatient Diabetes Program Recommendations:    Note: Glucose trends increased yesterday after Decadron 10 mg given. Fasting glucose back down to 156 this am. If trends continue to increase after PO intake, may consider a small dose of Novolog meal coverage to prevent postprandial glucose spikes.  Thanks,  Christena Deem RN, MSN, BC-ADM Inpatient Diabetes Coordinator Team Pager [phone] (8a-5p)

## 2022-12-01 NOTE — Plan of Care (Signed)
Problem: Activity: Goal: Ability to avoid complications of mobility impairment will improve Outcome: Progressing   Problem: Pain Management: Goal: Pain level will decrease Outcome: Progressing

## 2022-12-01 NOTE — Progress Notes (Signed)
   Neurosurgery Progress Note  History: Frank Vega is here for lumbar decompression  POD1: Doing well  Physical Exam: Vitals:   12/01/22 0541 12/01/22 0822  BP: 131/67 117/63  Pulse: 80 75  Resp: 16 18  Temp: 98.2 F (36.8 C) 98.2 F (36.8 C)  SpO2: 96% 96%    AA Ox3 CNI  Strength:5/5 throughout BLE Incision c/d/I Drain 85   Data:  Other tests/results: n/a  Assessment/Plan:  Frank Vega is doing well after surgery  - mobilize - pain control - DVT prophylaxis - PTOT - Monitor drain  Venetia Night MD, [facility] Department of Neurosurgery

## 2022-12-02 ENCOUNTER — Other Ambulatory Visit: Payer: Self-pay | Admitting: Internal Medicine

## 2022-12-02 DIAGNOSIS — F39 Unspecified mood [affective] disorder: Secondary | ICD-10-CM

## 2022-12-02 NOTE — Anesthesia Postprocedure Evaluation (Signed)
Anesthesia Post Note  Patient: Frank Vega  Procedure(s) Performed: L3-S1 POSTERIOR SPINAL DECOMPRESSION (Back)  Patient location during evaluation: PACU Anesthesia Type: General Level of consciousness: awake and alert Pain management: pain level controlled Vital Signs Assessment: post-procedure vital signs reviewed and stable Respiratory status: spontaneous breathing, nonlabored ventilation, respiratory function stable and patient connected to nasal cannula oxygen Cardiovascular status: blood pressure returned to baseline and stable Postop Assessment: no apparent nausea or vomiting Anesthetic complications: no   No notable events documented.   Last Vitals:  Vitals:   12/01/22 1004 12/01/22 1738  BP: 136/61 117/64  Pulse: 71 65  Resp: 18 18  Temp:  36.9 C  SpO2:  95%    Last Pain:  Vitals:   12/01/22 1738  TempSrc: Oral  PainSc: 0-No pain                 Lenard Simmer

## 2022-12-09 ENCOUNTER — Telehealth: Payer: Self-pay

## 2022-12-09 NOTE — Telephone Encounter (Signed)
Frank Vega called in stating that he still had the honeycomb dressing on. I informed him that he could remove this at this time. I instructed him to gently clean the area in the shower daily with mild soap and water and pat it dry and leave open to air.

## 2022-12-11 NOTE — Progress Notes (Signed)
   REFERRING PHYSICIAN:  Karie Schwalbe, Md [address] [place],  [place] [address]  DOS: 11/30/22  lumbar decompression L3-S1  HISTORY OF PRESENT ILLNESS: Frank Vega is approximately 2 weeks status post above surgery. Was given oxycodone and zanaflex on discharge from the hospital.   He is doing great, his preop leg pain is gone! He has no LBP. He did not take any oxycodone or zanaflex. No numbness, tingling, or weakness in his legs.    PHYSICAL EXAMINATION:  General: Patient is well developed, well nourished, calm, collected, and in no apparent distress.   NEUROLOGICAL:  General: In no acute distress.   Awake, alert, oriented to person, place, and time.  Pupils equal round and reactive to light.  Facial tone is symmetric.     Strength:            Side Iliopsoas Quads Hamstring PF DF EHL  R 5 5 5 5 5 5   L 5 5 5 5 5 5    Incision c/d/i   ROS (Neurologic):  Negative except as noted above  IMAGING: Nothing new to review.   ASSESSMENT/PLAN:  Frank Vega is doing well s/p above surgery. Treatment options reviewed with patient and following plan made:   - I have advised the patient to lift up to 10 pounds until 6 weeks after surgery (follow up with Dr. Myer Haff).  - Reviewed wound care.  - No bending, twisting, or lifting.  - Follow up as scheduled in 4 weeks and prn.   Advised to contact the office if any questions or concerns arise.  Drake Leach PA-C Department of neurosurgery

## 2022-12-16 ENCOUNTER — Encounter: Payer: Self-pay | Admitting: Orthopedic Surgery

## 2022-12-16 ENCOUNTER — Ambulatory Visit: Payer: Medicare Other | Admitting: Orthopedic Surgery

## 2022-12-16 VITALS — BP 134/76 | HR 85 | Temp 98.5°F

## 2022-12-16 DIAGNOSIS — M48062 Spinal stenosis, lumbar region with neurogenic claudication: Secondary | ICD-10-CM

## 2022-12-16 DIAGNOSIS — Z09 Encounter for follow-up examination after completed treatment for conditions other than malignant neoplasm: Secondary | ICD-10-CM

## 2022-12-16 DIAGNOSIS — Z9889 Other specified postprocedural states: Secondary | ICD-10-CM

## 2023-01-12 ENCOUNTER — Ambulatory Visit: Payer: Medicare Other | Admitting: Neurosurgery

## 2023-01-12 ENCOUNTER — Encounter: Payer: Self-pay | Admitting: Neurosurgery

## 2023-01-12 VITALS — BP 132/76 | Ht 68.0 in | Wt 224.0 lb

## 2023-01-12 DIAGNOSIS — M48062 Spinal stenosis, lumbar region with neurogenic claudication: Secondary | ICD-10-CM

## 2023-01-12 DIAGNOSIS — Z09 Encounter for follow-up examination after completed treatment for conditions other than malignant neoplasm: Secondary | ICD-10-CM

## 2023-01-12 NOTE — Progress Notes (Signed)
   REFERRING PHYSICIAN:  Karie Schwalbe, Md [address] [place],  [place] [address]  DOS: 11/30/22  lumbar decompression L3-S1  HISTORY OF PRESENT ILLNESS: Frank Vega is status post above surgery.  His pain is improved from 9 out of 10 to 2 out of 10.  He is very happy.     PHYSICAL EXAMINATION:  General: Patient is well developed, well nourished, calm, collected, and in no apparent distress.   NEUROLOGICAL:  General: In no acute distress.   Awake, alert, oriented to person, place, and time.  Pupils equal round and reactive to light.  Facial tone is symmetric.     Strength:            Side Iliopsoas Quads Hamstring PF DF EHL  R 5 5 5 5 5 5   L 5 5 5 5 5 5    Incision c/d/i   ROS (Neurologic):  Negative except as noted above  IMAGING: Nothing new to review.   ASSESSMENT/PLAN:  Frank Vega is doing well s/p above surgery.  I am very pleased with his improvements.  I think you will continue to have slow improvement from here.  Will see him back in clinic in 6 weeks.  We reviewed his activity limitations.  He is now on a 25 pound lifting limit.   Venetia Night MD Department of neurosurgery

## 2023-01-17 DIAGNOSIS — Z860101 Personal history of adenomatous and serrated colon polyps: Secondary | ICD-10-CM | POA: Diagnosis not present

## 2023-01-17 DIAGNOSIS — Z01818 Encounter for other preprocedural examination: Secondary | ICD-10-CM | POA: Diagnosis not present

## 2023-02-12 ENCOUNTER — Other Ambulatory Visit: Payer: Self-pay | Admitting: Internal Medicine

## 2023-02-17 ENCOUNTER — Other Ambulatory Visit: Payer: Self-pay | Admitting: Internal Medicine

## 2023-02-19 NOTE — Progress Notes (Unsigned)
   REFERRING PHYSICIAN:  Karie Schwalbe, Md [address] [place],  [place] [address]  DOS: 11/30/22  lumbar decompression L3-S1  HISTORY OF PRESENT ILLNESS:  He was doing well at his last visit.   He is doing well, he has intermittent soreness in his lower back when he does house work. He notes intermittent right > left knee pain, but no leg pain. Overall, he is very pleased with his progress.    PHYSICAL EXAMINATION:  General: Patient is well developed, well nourished, calm, collected, and in no apparent distress.   NEUROLOGICAL:  General: In no acute distress.   Awake, alert, oriented to person, place, and time.  Pupils equal round and reactive to light.  Facial tone is symmetric.     Strength:            Side Iliopsoas Quads Hamstring PF DF EHL  R 5 5 5 5 5 5   L 5 5 5 5 5 5    Incision well healed.    ROS (Neurologic):  Negative except as noted above  IMAGING: none  ASSESSMENT/PLAN:  Frank Vega is doing well s/p above surgery. Treatment options reviewed with patient and following plan made:   - He can return to activity as tolerated. Will still be careful with lifting.  - Follow up with ortho ([facility]) for knee pain prn.  - Discussed possible PT for his lower back. He declines for now. Will let me know if he changes his mind.  - He will f/u prn.   Advised to contact the office if any questions or concerns arise.  Drake Leach PA-C Department of neurosurgery

## 2023-02-21 ENCOUNTER — Encounter: Payer: Self-pay | Admitting: Orthopedic Surgery

## 2023-02-21 ENCOUNTER — Ambulatory Visit (INDEPENDENT_AMBULATORY_CARE_PROVIDER_SITE_OTHER): Payer: Medicare Other | Admitting: Orthopedic Surgery

## 2023-02-21 VITALS — BP 132/78 | Ht 68.0 in | Wt 224.0 lb

## 2023-02-21 DIAGNOSIS — Z9889 Other specified postprocedural states: Secondary | ICD-10-CM

## 2023-02-21 DIAGNOSIS — M48062 Spinal stenosis, lumbar region with neurogenic claudication: Secondary | ICD-10-CM

## 2023-03-22 DIAGNOSIS — L570 Actinic keratosis: Secondary | ICD-10-CM | POA: Diagnosis not present

## 2023-03-22 DIAGNOSIS — Z08 Encounter for follow-up examination after completed treatment for malignant neoplasm: Secondary | ICD-10-CM | POA: Diagnosis not present

## 2023-03-22 DIAGNOSIS — Z85828 Personal history of other malignant neoplasm of skin: Secondary | ICD-10-CM | POA: Diagnosis not present

## 2023-03-22 DIAGNOSIS — L309 Dermatitis, unspecified: Secondary | ICD-10-CM | POA: Diagnosis not present

## 2023-03-29 ENCOUNTER — Ambulatory Visit: Payer: Medicare Other | Admitting: Internal Medicine

## 2023-04-03 ENCOUNTER — Ambulatory Visit (INDEPENDENT_AMBULATORY_CARE_PROVIDER_SITE_OTHER): Payer: Medicare Other | Admitting: Internal Medicine

## 2023-04-03 ENCOUNTER — Encounter: Payer: Self-pay | Admitting: Internal Medicine

## 2023-04-03 VITALS — BP 110/64 | HR 92 | Temp 98.7°F | Ht 68.0 in | Wt 224.0 lb

## 2023-04-03 DIAGNOSIS — M48062 Spinal stenosis, lumbar region with neurogenic claudication: Secondary | ICD-10-CM | POA: Diagnosis not present

## 2023-04-03 DIAGNOSIS — E1142 Type 2 diabetes mellitus with diabetic polyneuropathy: Secondary | ICD-10-CM

## 2023-04-03 DIAGNOSIS — I10 Essential (primary) hypertension: Secondary | ICD-10-CM

## 2023-04-03 DIAGNOSIS — Z7984 Long term (current) use of oral hypoglycemic drugs: Secondary | ICD-10-CM

## 2023-04-03 LAB — POCT GLYCOSYLATED HEMOGLOBIN (HGB A1C): Hemoglobin A1C: 8.2 % — AB (ref 4.0–5.6)

## 2023-04-03 MED ORDER — VALSARTAN 160 MG PO TABS: 160.0000 mg | ORAL_TABLET | Freq: Every day | ORAL | 3 refills | Status: AC

## 2023-04-03 NOTE — Assessment & Plan Note (Signed)
Lab Results  Component Value Date   HGBA1C 8.2 (A) 04/03/2023   Worsened control on glipizide 5mg  daily Will hold off on other meds for now--he will work on healthy eating

## 2023-04-03 NOTE — Progress Notes (Signed)
Subjective:    Patient ID: Frank Vega, male    DOB: 1945/07/07, 78 y.o.   MRN: [id number]  HPI Here for follow up of diabetes and other chronic health conditions  Did have L3-S1 posterior spinal decompression by Dr Marcell Barlow in September Helped some--but not fully recovered No pain now--and didn't need pain pills Legs are still weak---may go back for more PT No longer needing cane  Persistent rash and itching on his forearms especially Dr Adolphus Birchwood concerned about the lisinopril/hydrochlorothiazide causing this Uses cream  Last sugar check 133---checks twice a month or so Eating about the same--not much fast food but does eat in restaurants  Current Outpatient Medications on File Prior to Visit  Medication Sig Dispense Refill   amLODipine (NORVASC) 5 MG tablet TAKE 1 TABLET BY MOUTH EVERY DAY 90 tablet 3   amoxicillin (AMOXIL) 500 MG capsule Take all 4 tablets by mouth 1 hour before dental procedure 4 capsule 2   atorvastatin (LIPITOR) 10 MG tablet TAKE 1 TABLET(10 MG) BY MOUTH 1 TIME A WEEK 13 tablet 3   diphenoxylate-atropine (LOMOTIL) 2.5-0.025 MG tablet TAKE 1 TABLET BY MOUTH EVERY DAY AS NEEDED FOR DIARRHEA OR LOOSE STOOLS 30 tablet 0   DULoxetine (CYMBALTA) 30 MG capsule TAKE 1 CAPSULE(30 MG) BY MOUTH DAILY 90 capsule 2   glipiZIDE (GLUCOTROL XL) 5 MG 24 hr tablet TAKE 1 TABLET BY MOUTH EVERY DAY WITH BREAKFAST 90 tablet 3   glucose blood (ONETOUCH ULTRA) test strip Use to check blood sugar once a day. Dx Code E11.42 100 each 4   lisinopril-hydrochlorothiazide (ZESTORETIC) 20-12.5 MG tablet TAKE 2 TABLETS BY MOUTH EVERY DAY 180 tablet 3   metFORMIN (GLUCOPHAGE-XR) 750 MG 24 hr tablet TAKE 1 TABLET BY MOUTH DAILY WITH SUPPER 90 tablet 3   mometasone (ELOCON) 0.1 % ointment Apply topically 2 (two) times daily.     Multiple Vitamins-Minerals (MULTIVITAMIN MEN PO) Take 1 tablet by mouth daily.     OneTouch Delica Lancets 30G MISC 1 each by Does not apply route daily. Dx Code  E11.42 100 each 3   senna (SENOKOT) 8.6 MG TABS tablet Take 1 tablet (8.6 mg total) by mouth 2 (two) times daily. 120 tablet 0   triamcinolone cream (KENALOG) 0.1 % APPLY TOPICALLY TO THE AFFECTED AREA TWICE DAILY AS NEEDED 45 g 1   No current facility-administered medications on file prior to visit.    Allergies  Allergen Reactions   Latex Itching and Rash   Shellfish Allergy Swelling    face   Paxil [Paroxetine Hcl]     Weight loss and jittery    Past Medical History:  Diagnosis Date   Cancer (HCC)    skin   Depression ~2010   situational and now better    Diabetes mellitus with polyneuropathy (HCC)    GERD (gastroesophageal reflux disease)    Hypertension    IBS (irritable bowel syndrome)    Osteoarthritis of multiple joints     Past Surgical History:  Procedure Laterality Date   COLONOSCOPY WITH PROPOFOL N/A 07/18/2017   Procedure: COLONOSCOPY WITH PROPOFOL;  Surgeon: Christena Deem, MD;  Location: [facility] ENDOSCOPY;  Service: Endoscopy;  Laterality: N/A;   CYST EXCISION  1952   left leg   KNEE ARTHROSCOPY Left 2011   LUMBAR LAMINECTOMY/DECOMPRESSION MICRODISCECTOMY N/A 11/30/2022   Procedure: L3-S1 POSTERIOR SPINAL DECOMPRESSION;  Surgeon: Venetia Night, MD;  Location: [facility] ORS;  Service: Neurosurgery;  Laterality: N/A;   REPLACEMENT TOTAL KNEE Left  TOTAL HIP ARTHROPLASTY Right     Family History  Problem Relation Age of Onset   Arthritis Mother    Hypertension Mother    Dementia Mother    Heart attack Father    Heart disease Father    Cancer Father        lymphoma    Social History   Socioeconomic History   Marital status: Widowed    Spouse name: Not on file   Number of children: 1   Years of education: Not on file   Highest education level: Not on file  Occupational History   Occupation: Multimedia programmer    Comment: Retired  Tobacco Use   Smoking status: Never    Passive exposure: Past   Smokeless tobacco: Never  Vaping  Use   Vaping status: Never Used  Substance and Sexual Activity   Alcohol use: No   Drug use: No   Sexual activity: Never  Other Topics Concern   Not on file  Social History Narrative   1 daughter--local   Wife died 05-31-23      No living will   Daughter should make health care decisions   Requests DNR--done 7.22/24   Not sure about tube feeds   Social Drivers of Health   Financial Resource Strain: Low Risk  (01/17/2023)   Received from [facility]   Overall Financial Resource Strain (CARDIA)    Difficulty of Paying Living Expenses: Not hard at all  Food Insecurity: No Food Insecurity (01/17/2023)   Received from [facility]   Hunger Vital Sign    Worried About Running Out of Food in the Last Year: Never true    Ran Out of Food in the Last Year: Never true  Transportation Needs: No Transportation Needs (01/17/2023)   Received from [facility] - Transportation    In the past 12 months, has lack of transportation kept you from medical appointments or from getting medications?: No    Lack of Transportation (Non-Medical): No  Physical Activity: Not on file  Stress: Not on file  Social Connections: Not on file  Intimate Partner Violence: Not At Risk (11/30/2022)   Humiliation, Afraid, Rape, and Kick questionnaire    Fear of Current or Ex-Partner: No    Emotionally Abused: No    Physically Abused: No    Sexually Abused: No   Review of Systems Weight stable No chest pain or SOB    Objective:   Physical Exam Constitutional:      Appearance: Normal appearance.  Cardiovascular:     Rate and Rhythm: Normal rate and regular rhythm.     Heart sounds: No murmur heard.    No gallop.     Comments: Faint pedal pulses Pulmonary:     Effort: Pulmonary effort is normal.     Breath sounds: Normal breath sounds. No wheezing or rales.  Musculoskeletal:     Cervical back: Neck supple.     Right lower leg: No edema.      Left lower leg: No edema.  Lymphadenopathy:     Cervical: No cervical adenopathy.  Skin:    Comments: Scattered scaly rash--feet and arms  Neurological:     Mental Status: He is alert.            Assessment & Plan:

## 2023-04-03 NOTE — Assessment & Plan Note (Signed)
Improved after spinal surgery by Dr Marcell Barlow

## 2023-04-03 NOTE — Assessment & Plan Note (Signed)
BP Readings from Last 3 Encounters:  04/03/23 110/64  02/21/23 132/78  01/12/23 132/76   Dr Adolphus Birchwood is concerned about the lisinopril/hydrochlorothiazide as source of rash/itching Will try change to valsartan 160mg 

## 2023-05-14 ENCOUNTER — Other Ambulatory Visit: Payer: Self-pay | Admitting: Internal Medicine

## 2023-07-18 NOTE — OR Nursing (Signed)
 Pre call made for 07/25/23 colonoscopy appt; pt stated that it was to be cancelled as of 07/06/23 and arranged for early August. Personal issues interfered with original appt

## 2023-07-24 ENCOUNTER — Ambulatory Visit: Admitting: Internal Medicine

## 2023-07-24 ENCOUNTER — Encounter: Payer: Self-pay | Admitting: Internal Medicine

## 2023-07-24 VITALS — BP 122/66 | HR 68 | Temp 98.2°F | Ht 68.0 in | Wt 217.0 lb

## 2023-07-24 DIAGNOSIS — R42 Dizziness and giddiness: Secondary | ICD-10-CM | POA: Diagnosis not present

## 2023-07-24 DIAGNOSIS — I10 Essential (primary) hypertension: Secondary | ICD-10-CM

## 2023-07-24 DIAGNOSIS — K529 Noninfective gastroenteritis and colitis, unspecified: Secondary | ICD-10-CM | POA: Diagnosis not present

## 2023-07-24 MED ORDER — MECLIZINE HCL 25 MG PO TABS: 25.0000 mg | ORAL_TABLET | Freq: Three times a day (TID) | ORAL | 1 refills | Status: AC | PRN

## 2023-07-24 NOTE — Progress Notes (Signed)
 Subjective:    Patient ID: Frank Vega, male    DOB: November 22, 1945, 77 y.o.   MRN: [id number]  HPI Here with daughter due to dizziness  Started 2 days ago and worsened after lunch Called daughter because he felt bad Then vomiting and diarrhea Some nausea daily Worsened during day--then trouble getting out of bed "felt like I stepped off a carousel" Eating very little Several hot flashes and felt clammy  No falls Things are spinning--able to hold on More trouble when bending over to put on socks No trouble walking---other than some back/knee pain  Has checked sugars---153 two days ago after eating 177 this morning after some half and half  No clear suspect foods  Current Outpatient Medications on File Prior to Visit  Medication Sig Dispense Refill   amLODipine  (NORVASC ) 5 MG tablet TAKE 1 TABLET BY MOUTH EVERY DAY 90 tablet 3   amoxicillin  (AMOXIL ) 500 MG capsule Take all 4 tablets by mouth 1 hour before dental procedure 4 capsule 2   atorvastatin  (LIPITOR) 10 MG tablet TAKE 1 TABLET(10 MG) BY MOUTH 1 TIME A WEEK 13 tablet 3   diphenoxylate -atropine  (LOMOTIL ) 2.5-0.025 MG tablet TAKE 1 TABLET BY MOUTH EVERY DAY AS NEEDED FOR DIARRHEA OR LOOSE STOOLS 30 tablet 0   DULoxetine  (CYMBALTA ) 30 MG capsule TAKE 1 CAPSULE(30 MG) BY MOUTH DAILY 90 capsule 2   glipiZIDE  (GLUCOTROL  XL) 5 MG 24 hr tablet TAKE 1 TABLET BY MOUTH EVERY DAY WITH BREAKFAST 90 tablet 3   glucose blood (ONETOUCH ULTRA) test strip Use to check blood sugar once a day. Dx Code E11.42 100 each 4   metFORMIN  (GLUCOPHAGE -XR) 750 MG 24 hr tablet TAKE 1 TABLET BY MOUTH DAILY WITH SUPPER 90 tablet 3   mometasone (ELOCON) 0.1 % ointment Apply topically 2 (two) times daily.     Multiple Vitamins-Minerals (MULTIVITAMIN MEN PO) Take 1 tablet by mouth daily.     OneTouch Delica Lancets 30G MISC 1 each by Does not apply route daily. Dx Code E11.42 100 each 3   senna (SENOKOT) 8.6 MG TABS tablet Take 1 tablet (8.6 mg total) by  mouth 2 (two) times daily. 120 tablet 0   triamcinolone  cream (KENALOG ) 0.1 % APPLY TOPICALLY TO THE AFFECTED AREA TWICE DAILY AS NEEDED 45 g 1   valsartan  (DIOVAN ) 160 MG tablet Take 1 tablet (160 mg total) by mouth daily. 90 tablet 3   No current facility-administered medications on file prior to visit.    Allergies  Allergen Reactions   Latex Itching and Rash   Shellfish Allergy Swelling    face   Paxil [Paroxetine Hcl]     Weight loss and jittery    Past Medical History:  Diagnosis Date   Cancer (HCC)    skin   Depression ~2010   situational and now better    Diabetes mellitus with polyneuropathy (HCC)    GERD (gastroesophageal reflux disease)    Hypertension    IBS (irritable bowel syndrome)    Osteoarthritis of multiple joints     Past Surgical History:  Procedure Laterality Date   COLONOSCOPY WITH PROPOFOL  N/A 07/18/2017   Procedure: COLONOSCOPY WITH PROPOFOL ;  Surgeon: Deveron Fly, MD;  Location: [facility] ENDOSCOPY;  Service: Endoscopy;  Laterality: N/A;   CYST EXCISION  1952   left leg   KNEE ARTHROSCOPY Left 2011   LUMBAR LAMINECTOMY/DECOMPRESSION MICRODISCECTOMY N/A 11/30/2022   Procedure: L3-S1 POSTERIOR SPINAL DECOMPRESSION;  Surgeon: Jodeen Munch, MD;  Location: [facility] ORS;  Service: Neurosurgery;  Laterality: N/A;   REPLACEMENT TOTAL KNEE Left    TOTAL HIP ARTHROPLASTY Right     Family History  Problem Relation Age of Onset   Arthritis Mother    Hypertension Mother    Dementia Mother    Heart attack Father    Heart disease Father    Cancer Father        lymphoma    Social History   Socioeconomic History   Marital status: Widowed    Spouse name: Not on file   Number of children: 1   Years of education: Not on file   Highest education level: Not on file  Occupational History   Occupation: Multimedia programmer    Comment: Retired  Tobacco Use   Smoking status: Never    Passive exposure: Past   Smokeless tobacco: Never   Vaping Use   Vaping status: Never Used  Substance and Sexual Activity   Alcohol use: No   Drug use: No   Sexual activity: Never  Other Topics Concern   Not on file  Social History Narrative   1 daughter--local   Wife died 06/28/23      No living will   Daughter should make health care decisions   Requests DNR--done 7.22/24   Not sure about tube feeds   Social Drivers of Health   Financial Resource Strain: Low Risk  (01/17/2023)   Received from [facility]   Overall Financial Resource Strain (CARDIA)    Difficulty of Paying Living Expenses: Not hard at all  Food Insecurity: No Food Insecurity (01/17/2023)   Received from [facility]   Hunger Vital Sign    Worried About Running Out of Food in the Last Year: Never true    Ran Out of Food in the Last Year: Never true  Transportation Needs: No Transportation Needs (01/17/2023)   Received from [facility] - Transportation    In the past 12 months, has lack of transportation kept you from medical appointments or from getting medications?: No    Lack of Transportation (Non-Medical): No  Physical Activity: Not on file  Stress: Not on file  Social Connections: Not on file  Intimate Partner Violence: Not At Risk (11/30/2022)   Humiliation, Afraid, Rape, and Kick questionnaire    Fear of Current or Ex-Partner: No    Emotionally Abused: No    Physically Abused: No    Sexually Abused: No   Review of Systems No travel No tinnitus Hearing is not good--but no recent change No fever or illness Still with rash and itching--cream from Dr Tresa Frohlich does help some    Objective:   Physical Exam Constitutional:      Appearance: Normal appearance.  Eyes:     Extraocular Movements: Extraocular movements intact.     Pupils: Pupils are equal, round, and reactive to light.     Comments: No nystagmus  Cardiovascular:     Rate and Rhythm: Normal rate and regular rhythm.     Heart  sounds: No murmur heard.    No gallop.  Pulmonary:     Effort: Pulmonary effort is normal.     Breath sounds: Normal breath sounds. No wheezing or rales.  Abdominal:     General: Bowel sounds are normal. There is no distension.     Palpations: Abdomen is soft.     Tenderness: There is no abdominal tenderness. There is no guarding or rebound.  Musculoskeletal:  Cervical back: Neck supple.     Right lower leg: No edema.     Left lower leg: No edema.  Lymphadenopathy:     Cervical: No cervical adenopathy.  Neurological:     General: No focal deficit present.     Mental Status: He is alert.     Cranial Nerves: No cranial nerve deficit.     Coordination: Coordination normal.     Gait: Gait normal.     Comments: Romberg absent            Assessment & Plan:

## 2023-07-24 NOTE — Assessment & Plan Note (Signed)
 BP Readings from Last 3 Encounters:  07/24/23 122/66  04/03/23 110/64  02/21/23 132/78   Fine with switch to valsartan  (from lisinopril /hydrochlorothiazide ) in case that was causing some ithc

## 2023-07-24 NOTE — Assessment & Plan Note (Signed)
 Seems to be vestibular--nothing to suggest stroke Will give meclizine  25mg  for prn use---but sparingly (with precautions)

## 2023-07-24 NOTE — Assessment & Plan Note (Signed)
 Seems to have had GI symptoms that are not linked to vertigo No clear inciting food--but did get meal from church on day this started Will slowly increase eating Exam was benign

## 2023-08-04 DIAGNOSIS — G8929 Other chronic pain: Secondary | ICD-10-CM | POA: Diagnosis not present

## 2023-08-04 DIAGNOSIS — M1711 Unilateral primary osteoarthritis, right knee: Secondary | ICD-10-CM | POA: Diagnosis not present

## 2023-08-04 DIAGNOSIS — M25561 Pain in right knee: Secondary | ICD-10-CM | POA: Diagnosis not present

## 2023-08-04 DIAGNOSIS — R1031 Right lower quadrant pain: Secondary | ICD-10-CM | POA: Diagnosis not present

## 2023-08-04 DIAGNOSIS — Z96641 Presence of right artificial hip joint: Secondary | ICD-10-CM | POA: Diagnosis not present

## 2023-08-20 ENCOUNTER — Other Ambulatory Visit: Payer: Self-pay | Admitting: Internal Medicine

## 2023-08-20 DIAGNOSIS — F39 Unspecified mood [affective] disorder: Secondary | ICD-10-CM

## 2023-09-04 DIAGNOSIS — L2989 Other pruritus: Secondary | ICD-10-CM | POA: Diagnosis not present

## 2023-09-04 DIAGNOSIS — D2262 Melanocytic nevi of left upper limb, including shoulder: Secondary | ICD-10-CM | POA: Diagnosis not present

## 2023-09-04 DIAGNOSIS — D2272 Melanocytic nevi of left lower limb, including hip: Secondary | ICD-10-CM | POA: Diagnosis not present

## 2023-09-04 DIAGNOSIS — L570 Actinic keratosis: Secondary | ICD-10-CM | POA: Diagnosis not present

## 2023-09-04 DIAGNOSIS — Z85828 Personal history of other malignant neoplasm of skin: Secondary | ICD-10-CM | POA: Diagnosis not present

## 2023-09-04 DIAGNOSIS — D2261 Melanocytic nevi of right upper limb, including shoulder: Secondary | ICD-10-CM | POA: Diagnosis not present

## 2023-09-04 DIAGNOSIS — D225 Melanocytic nevi of trunk: Secondary | ICD-10-CM | POA: Diagnosis not present

## 2023-09-14 DIAGNOSIS — M1711 Unilateral primary osteoarthritis, right knee: Secondary | ICD-10-CM | POA: Diagnosis not present

## 2023-09-14 DIAGNOSIS — E119 Type 2 diabetes mellitus without complications: Secondary | ICD-10-CM | POA: Diagnosis not present

## 2023-09-22 DIAGNOSIS — Z860101 Personal history of adenomatous and serrated colon polyps: Secondary | ICD-10-CM | POA: Diagnosis not present

## 2023-09-22 DIAGNOSIS — Z09 Encounter for follow-up examination after completed treatment for conditions other than malignant neoplasm: Secondary | ICD-10-CM | POA: Diagnosis not present

## 2023-09-28 ENCOUNTER — Ambulatory Visit

## 2023-09-28 ENCOUNTER — Encounter: Payer: Self-pay | Admitting: *Deleted

## 2023-09-28 VITALS — BP 122/66 | Ht 68.0 in | Wt 214.0 lb

## 2023-09-28 DIAGNOSIS — Z0000 Encounter for general adult medical examination without abnormal findings: Secondary | ICD-10-CM | POA: Diagnosis not present

## 2023-09-28 NOTE — Patient Instructions (Signed)
 Mr. Herbers , Thank you for taking time out of your busy schedule to complete your Annual Wellness Visit with me. I enjoyed our conversation and look forward to speaking with you again next year. I, as well as your care team,  appreciate your ongoing commitment to your health goals. Please review the following plan we discussed and let me know if I can assist you in the future. Your Game plan/ To Do List    Referrals: If you haven't heard from the office you've been referred to, please reach out to them at the phone provided.  none Follow up Visits: Next Medicare AWV with our clinical staff:    Have you seen your provider in the last 6 months (3 months if uncontrolled diabetes)? No Next Office Visit with your provider: n/a  Clinician Recommendations:  Aim for 30 minutes of exercise or brisk walking, 6-8 glasses of water, and 5 servings of fruits and vegetables each day.       This is a list of the screening recommended for you and due dates:  Health Maintenance  Topic Date Due   Yearly kidney health urinalysis for diabetes  Never done   Complete foot exam   09/26/2023   Eye exam for diabetics  09/28/2023   Zoster (Shingles) Vaccine (1 of 2) 10/24/2023*   Hemoglobin A1C  10/01/2023   Flu Shot  10/06/2023   Yearly kidney function blood test for diabetes  11/30/2023   Medicare Annual Wellness Visit  09/27/2024   Pneumococcal Vaccine for age over 54  Completed   Hepatitis B Vaccine  Aged Out   HPV Vaccine  Aged Out   Meningitis B Vaccine  Aged Out   DTaP/Tdap/Td vaccine  Discontinued   Colon Cancer Screening  Discontinued   COVID-19 Vaccine  Discontinued   Hepatitis C Screening  Discontinued  *Topic was postponed. The date shown is not the original due date.    Advanced directives: (Copy Requested) Please bring a copy of your health care power of attorney and living will to the office to be added to your chart at your convenience. You can mail to [facility] [address]  [address] [place], [place] [address] or email to [email or web address] Advance Care Planning is important because it:  [x]  Makes sure you receive the medical care that is consistent with your values, goals, and preferences  [x]  It provides guidance to your family and loved ones and reduces their decisional burden about whether or not they are making the right decisions based on your wishes.  Follow the link provided in your after visit summary or read over the paperwork we have mailed to you to help you started getting your Advance Directives in place. If you need assistance in completing these, please reach out to us  so that we can help you!  See attachments for Preventive Care and Fall Prevention Tips.

## 2023-09-28 NOTE — Progress Notes (Signed)
 Because this visit was a virtual/telehealth visit,  certain criteria was not obtained, such a blood pressure, CBG if applicable, and timed get up and go. Any medications not marked as taking were not mentioned during the medication reconciliation part of the visit. Any vitals not documented were not able to be obtained due to this being a telehealth visit or patient was unable to self-report a recent blood pressure reading due to a lack of equipment at home via telehealth. Vitals that have been documented are verbally provided by the patient.  This visit was performed by a medical professional under my direct supervision. I was immediately available for consultation/collaboration. I have reviewed and agree with the Annual Wellness Visit documentation.   Subjective:   Frank Vega is a 78 y.o. who presents for a Medicare Wellness preventive visit.  As a reminder, Annual Wellness Visits don't include a physical exam, and some assessments may be limited, especially if this visit is performed virtually. We may recommend an in-person follow-up visit with your provider if needed.  Visit Complete: Virtual I connected with  Frank Vega on 09/28/23 by a audio enabled telemedicine application and verified that I am speaking with the correct person using two identifiers.  Patient Location: Home  Provider Location: Home Office  I discussed the limitations of evaluation and management by telemedicine. The patient expressed understanding and agreed to proceed.  Vital Signs: Because this visit was a virtual/telehealth visit, some criteria may be missing or patient reported. Any vitals not documented were not able to be obtained and vitals that have been documented are patient reported.  VideoDeclined- This patient declined Librarian, academic. Therefore the visit was completed with audio only.  Persons Participating in Visit: Patient.  AWV Questionnaire: No: Patient  Medicare AWV questionnaire was not completed prior to this visit.  Cardiac Risk Factors include: advanced age (>82men, >57 women);male gender;hypertension;diabetes mellitus     Objective:    Today's Vitals   09/28/23 1549  BP: 122/66  Weight: 214 lb (97.1 kg)  Height: 5' 8 (1.727 m)   Body mass index is 32.54 kg/m.     09/28/2023    3:56 PM 11/30/2022    6:21 AM 11/22/2022   10:31 AM 07/18/2017    7:56 AM  Advanced Directives  Does Patient Have a Medical Advance Directive? Yes Yes Yes Yes   Type of Estate agent of [place];Living will Healthcare Power of [place];Living will Healthcare Power of [place];Living will Healthcare Power of [place];Living will  Does patient want to make changes to medical advance directive? No - Patient declined No - Patient declined No - Patient declined   Copy of Healthcare Power of Attorney in Chart? Yes - validated most recent copy scanned in chart (See row information) No - copy requested No - copy requested      Data saved with a previous flowsheet row definition    Current Medications (verified) Outpatient Encounter Medications as of 09/28/2023  Medication Sig   amLODipine  (NORVASC ) 5 MG tablet TAKE 1 TABLET BY MOUTH EVERY DAY   amoxicillin  (AMOXIL ) 500 MG capsule Take all 4 tablets by mouth 1 hour before dental procedure   atorvastatin  (LIPITOR) 10 MG tablet TAKE 1 TABLET(10 MG) BY MOUTH 1 TIME A WEEK   diphenoxylate -atropine  (LOMOTIL ) 2.5-0.025 MG tablet TAKE 1 TABLET BY MOUTH EVERY DAY AS NEEDED FOR DIARRHEA OR LOOSE STOOLS   DULoxetine  (CYMBALTA ) 30 MG capsule TAKE 1 CAPSULE(30 MG) BY MOUTH DAILY  glipiZIDE  (GLUCOTROL  XL) 5 MG 24 hr tablet TAKE 1 TABLET BY MOUTH EVERY DAY WITH BREAKFAST   glucose blood (ONETOUCH ULTRA) test strip Use to check blood sugar once a day. Dx Code E11.42   meclizine  (ANTIVERT ) 25 MG tablet Take 1 tablet (25 mg total) by mouth 3 (three) times daily as needed for dizziness.   metFORMIN   (GLUCOPHAGE -XR) 750 MG 24 hr tablet TAKE 1 TABLET BY MOUTH DAILY WITH SUPPER   mometasone (ELOCON) 0.1 % ointment Apply topically 2 (two) times daily.   Multiple Vitamins-Minerals (MULTIVITAMIN MEN PO) Take 1 tablet by mouth daily.   OneTouch Delica Lancets 30G MISC 1 each by Does not apply route daily. Dx Code E11.42   senna (SENOKOT) 8.6 MG TABS tablet Take 1 tablet (8.6 mg total) by mouth 2 (two) times daily.   triamcinolone  cream (KENALOG ) 0.1 % APPLY TOPICALLY TO THE AFFECTED AREA TWICE DAILY AS NEEDED   valsartan  (DIOVAN ) 160 MG tablet Take 1 tablet (160 mg total) by mouth daily.   No facility-administered encounter medications on file as of 09/28/2023.    Allergies (verified) Latex, Shellfish allergy, and Paxil [paroxetine hcl]   History: Past Medical History:  Diagnosis Date   Cancer (HCC)    skin   Chronic venous insufficiency    Depression ~2010   situational and now better    Diabetes mellitus with polyneuropathy (HCC)    GERD (gastroesophageal reflux disease)    Hypertension    IBS (irritable bowel syndrome)    Lumbar disc herniation with radiculopathy    Lumbar stenosis    Osteoarthritis of multiple joints    Phimosis    SCC (squamous cell carcinoma), ear, left    Past Surgical History:  Procedure Laterality Date   COLONOSCOPY WITH PROPOFOL  N/A 07/18/2017   Procedure: COLONOSCOPY WITH PROPOFOL ;  Surgeon: Gaylyn Gladis PENNER, MD;  [facility] ENDOSCOPY;  Service: Endoscopy;  Laterality: N/A;   CYST EXCISION  1952   left leg   KNEE ARTHROSCOPY Left 2011   LUMBAR LAMINECTOMY/DECOMPRESSION MICRODISCECTOMY N/A 11/30/2022   Procedure: L3-S1 POSTERIOR SPINAL DECOMPRESSION;  Surgeon: Clois Fret, MD;  Location: [facility] ORS;  Service: Neurosurgery;  Laterality: N/A;   REPLACEMENT TOTAL KNEE Left    TOTAL HIP ARTHROPLASTY Right    Family History  Problem Relation Age of Onset   Arthritis Mother    Hypertension Mother    Dementia Mother    Heart attack Father     Heart disease Father    Cancer Father        lymphoma   Social History   Socioeconomic History   Marital status: Widowed    Spouse name: Not on file   Number of children: 1   Years of education: Not on file   Highest education level: Not on file  Occupational History   Occupation: Multimedia programmer    Comment: Retired  Tobacco Use   Smoking status: Never    Passive exposure: Past   Smokeless tobacco: Never  Vaping Use   Vaping status: Never Used  Substance and Sexual Activity   Alcohol use: No   Drug use: No   Sexual activity: Never  Other Topics Concern   Not on file  Social History Narrative   1 daughter--local   Wife died 06-21-2023      No living will   Daughter should make health care decisions   Requests DNR--done 7.22/24   Not sure about tube feeds   Social Drivers of Health   Financial  Resource Strain: Low Risk  (09/28/2023)   Overall Financial Resource Strain (CARDIA)    Difficulty of Paying Living Expenses: Not very hard  Food Insecurity: No Food Insecurity (09/28/2023)   Hunger Vital Sign    Worried About Running Out of Food in the Last Year: Never true    Ran Out of Food in the Last Year: Never true  Transportation Needs: No Transportation Needs (09/28/2023)   PRAPARE - Administrator, Civil Service (Medical): No    Lack of Transportation (Non-Medical): No  Physical Activity: Insufficiently Active (09/28/2023)   Exercise Vital Sign    Days of Exercise per Week: 5 days    Minutes of Exercise per Session: 20 min  Stress: No Stress Concern Present (09/28/2023)   [employer] of Occupational Health - Occupational Stress Questionnaire    Feeling of Stress: Not at all  Social Connections: Socially Integrated (09/28/2023)   Social Connection and Isolation Panel    Frequency of Communication with Friends and Family: More than three times a week    Frequency of Social Gatherings with Friends and Family: More than three times a week     Attends Religious Services: More than 4 times per year    Active Member of [employer] or Organizations: Yes    Attends Engineer, structural: More than 4 times per year    Marital Status: Married    Tobacco Counseling Counseling given: Not Answered    Clinical Intake:  Pre-visit preparation completed: Yes  Pain : No/denies pain     BMI - recorded: 32.54 Nutritional Status: BMI > 30  Obese Nutritional Risks: None Diabetes: No  Lab Results  Component Value Date   HGBA1C 8.2 (A) 04/03/2023   HGBA1C 7.2 (H) 09/26/2022   HGBA1C 6.5 07/26/2021     How often do you need to have someone help you when you read instructions, pamphlets, or other written materials from your doctor or pharmacy?: 1 - Never  Interpreter Needed?: No  Information entered by :: Bora Broner,cma   Activities of Daily Living     09/28/2023    3:53 PM 11/30/2022   10:36 AM  In your present state of health, do you have any difficulty performing the following activities:  Hearing? 0 1  Vision? 0 0  Difficulty concentrating or making decisions? 0 0  Walking or climbing stairs? 0   Dressing or bathing? 0   Doing errands, shopping? 0   Preparing Food and eating ? N   Using the Toilet? N   In the past six months, have you accidently leaked urine? N   Do you have problems with loss of bowel control? N   Managing your Medications? N   Managing your Finances? N   Housekeeping or managing your Housekeeping? N     Patient Care Team: [name], MD as PCP - General (Internal Medicine) Myra Rosaline FALCON, [facility] (Inactive) as Pharmacist (Pharmacist)  I have updated your Care Teams any recent Medical Services you may have received from other providers in the past year.     Assessment:   This is a routine wellness examination for Jarin.  Hearing/Vision screen Hearing Screening - Comments:: Patient wears hearing aids Vision Screening - Comments:: Wears glasses   Goals Addressed              This Visit's Progress    Patient Stated       Patient would like to stay busy  Depression Screen     09/28/2023    3:56 PM 09/26/2022   10:54 AM 09/26/2022   10:28 AM 07/26/2021    4:28 PM 03/31/2020   10:23 AM 11/08/2017   10:14 AM  PHQ 2/9 Scores  PHQ - 2 Score 2 1 1 1  0 1  PHQ- 9 Score 2  3       Fall Risk     09/28/2023    3:55 PM 09/26/2022   10:54 AM 09/26/2022   10:28 AM 07/26/2021    4:27 PM 03/31/2020   10:23 AM  Fall Risk   Falls in the past year? 0 0 0 0 1  Number falls in past yr: 0  0  0  Injury with Fall? 0  0  0  Risk for fall due to : No Fall Risks  No Fall Risks    Follow up Falls evaluation completed  Falls evaluation completed      MEDICARE RISK AT HOME:  Medicare Risk at Home Any stairs in or around the home?: Yes If so, are there any without handrails?: No Home free of loose throw rugs in walkways, pet beds, electrical cords, etc?: Yes Adequate lighting in your home to reduce risk of falls?: Yes Life alert?: No Use of a cane, walker or w/c?: No Grab bars in the bathroom?: Yes Shower chair or bench in shower?: Yes Elevated toilet seat or a handicapped toilet?: Yes  TIMED UP AND GO:  Was the test performed?  No  Cognitive Function: 6CIT completed        09/28/2023    3:51 PM  6CIT Screen  What Year? 0 points  What month? 0 points  What time? 0 points  Count back from 20 0 points  Months in reverse 0 points  Repeat phrase 0 points  Total Score 0 points    Immunizations Immunization History  Administered Date(s) Administered   Fluad Quad(high Dose 65+) 12/11/2019, 01/12/2021   Influenza,inj,Quad PF,6+ Mos 11/08/2017   Influenza-Unspecified 12/30/2014, 02/09/2017   PFIZER(Purple Top)SARS-COV-2 Vaccination 04/19/2019, 05/10/2019   Pneumococcal Conjugate-13 11/08/2017   Pneumococcal Polysaccharide-23 05/13/2014    Screening Tests Health Maintenance  Topic Date Due   Diabetic kidney evaluation - Urine ACR  Never done    FOOT EXAM  09/26/2023   OPHTHALMOLOGY EXAM  09/28/2023   Zoster Vaccines- Shingrix (1 of 2) 10/24/2023 (Originally 06/29/1995)   HEMOGLOBIN A1C  10/01/2023   INFLUENZA VACCINE  10/06/2023   Diabetic kidney evaluation - eGFR measurement  11/30/2023   Medicare Annual Wellness (AWV)  09/27/2024   Pneumococcal Vaccine: 50+ Years  Completed   Hepatitis B Vaccines  Aged Out   HPV VACCINES  Aged Out   Meningococcal B Vaccine  Aged Out   DTaP/Tdap/Td  Discontinued   Colonoscopy  Discontinued   COVID-19 Vaccine  Discontinued   Hepatitis C Screening  Discontinued    Health Maintenance  Health Maintenance Due  Topic Date Due   Diabetic kidney evaluation - Urine ACR  Never done   FOOT EXAM  09/26/2023   OPHTHALMOLOGY EXAM  09/28/2023   Health Maintenance Items Addressed:  Additional Screening:  Vision Screening: Recommended annual ophthalmology exams for early detection of glaucoma and other disorders of the eye. Would you like a referral to an eye doctor? No    Dental Screening: Recommended annual dental exams for proper oral hygiene  Community Resource Referral / Chronic Care Management: CRR required this visit?  No   CCM required this visit?  No   Plan:    I have personally reviewed and noted the following in the patient's chart:   Medical and social history Use of alcohol, tobacco or illicit drugs  Current medications and supplements including opioid prescriptions. Patient is not currently taking opioid prescriptions. Functional ability and status Nutritional status Physical activity Advanced directives List of other physicians Hospitalizations, surgeries, and ER visits in previous 12 months Vitals Screenings to include cognitive, depression, and falls Referrals and appointments  In addition, I have reviewed and discussed with patient certain preventive protocols, quality metrics, and best practice recommendations. A written personalized care plan for preventive  services as well as general preventive health recommendations were provided to patient.   Lyle MARLA Right, [place]   09/28/2023   After Visit Summary: (MyChart) Due to this being a telephonic visit, the after visit summary with patients personalized plan was offered to patient via MyChart   Notes: Nothing significant to report at this time.

## 2023-10-02 ENCOUNTER — Encounter: Payer: Self-pay | Admitting: Internal Medicine

## 2023-10-02 ENCOUNTER — Ambulatory Visit: Payer: Self-pay | Admitting: Internal Medicine

## 2023-10-02 ENCOUNTER — Ambulatory Visit (INDEPENDENT_AMBULATORY_CARE_PROVIDER_SITE_OTHER): Payer: Medicare Other | Admitting: Internal Medicine

## 2023-10-02 VITALS — BP 122/80 | HR 77 | Temp 98.2°F | Ht 68.0 in | Wt 224.0 lb

## 2023-10-02 DIAGNOSIS — E1142 Type 2 diabetes mellitus with diabetic polyneuropathy: Secondary | ICD-10-CM

## 2023-10-02 DIAGNOSIS — E119 Type 2 diabetes mellitus without complications: Secondary | ICD-10-CM | POA: Insufficient documentation

## 2023-10-02 DIAGNOSIS — Z7984 Long term (current) use of oral hypoglycemic drugs: Secondary | ICD-10-CM | POA: Diagnosis not present

## 2023-10-02 DIAGNOSIS — I10 Essential (primary) hypertension: Secondary | ICD-10-CM | POA: Diagnosis not present

## 2023-10-02 DIAGNOSIS — F39 Unspecified mood [affective] disorder: Secondary | ICD-10-CM

## 2023-10-02 LAB — POCT GLYCOSYLATED HEMOGLOBIN (HGB A1C): Hemoglobin A1C: 8.2 % — AB (ref 4.0–5.6)

## 2023-10-02 MED ORDER — EMPAGLIFLOZIN 10 MG PO TABS: 10.0000 mg | ORAL_TABLET | Freq: Every day | ORAL | 3 refills | Status: AC

## 2023-10-02 NOTE — Assessment & Plan Note (Signed)
 Grieving is better Has hobby that he enjoys now--and social interaction Will continue the duloxetine  30mg  for the neuropathy

## 2023-10-02 NOTE — Assessment & Plan Note (Signed)
 BP Readings from Last 3 Encounters:  10/02/23 122/80  09/28/23 122/66  07/24/23 122/66   Controlled with valsartan  160mg 

## 2023-10-02 NOTE — Progress Notes (Signed)
 Subjective:    Patient ID: Frank Vega, male    DOB: [birth year], 78 y.o.   MRN: [id number]  HPI Here for follow up of diabetes and other chronic health conditions  Right knee is bone on bone Has seen ortho at [facility] clinic---ready for surgery but A1c too high Checks sugars --- 140's-150's fasting Neuropathy is perhaps slightly better  New hobby--making [employer] flintlocks Helping his mental outlook Mood is better Continues on the duloxetine   No chest pain  Stable DOE--if he pushes it Itching is better with ointment--but still noticeable in shoulders  Current Outpatient Medications on File Prior to Visit  Medication Sig Dispense Refill   amoxicillin  (AMOXIL ) 500 MG capsule Take all 4 tablets by mouth 1 hour before dental procedure 4 capsule 2   atorvastatin  (LIPITOR) 10 MG tablet TAKE 1 TABLET(10 MG) BY MOUTH 1 TIME A WEEK 13 tablet 3   diphenoxylate -atropine  (LOMOTIL ) 2.5-0.025 MG tablet TAKE 1 TABLET BY MOUTH EVERY DAY AS NEEDED FOR DIARRHEA OR LOOSE STOOLS 30 tablet 0   DULoxetine  (CYMBALTA ) 30 MG capsule TAKE 1 CAPSULE(30 MG) BY MOUTH DAILY 90 capsule 3   glipiZIDE  (GLUCOTROL  XL) 5 MG 24 hr tablet TAKE 1 TABLET BY MOUTH EVERY DAY WITH BREAKFAST 90 tablet 3   glucose blood (ONETOUCH ULTRA) test strip Use to check blood sugar once a day. Dx Code E11.42 100 each 4   meclizine  (ANTIVERT ) 25 MG tablet Take 1 tablet (25 mg total) by mouth 3 (three) times daily as needed for dizziness. 60 tablet 1   metFORMIN  (GLUCOPHAGE -XR) 750 MG 24 hr tablet TAKE 1 TABLET BY MOUTH DAILY WITH SUPPER 90 tablet 3   mometasone (ELOCON) 0.1 % ointment Apply topically 2 (two) times daily.     Multiple Vitamins-Minerals (MULTIVITAMIN MEN PO) Take 1 tablet by mouth daily.     OneTouch Delica Lancets 30G MISC 1 each by Does not apply route daily. Dx Code E11.42 100 each 3   senna (SENOKOT) 8.6 MG TABS tablet Take 1 tablet (8.6 mg total) by mouth 2 (two) times daily. 120 tablet 0    triamcinolone  cream (KENALOG ) 0.1 % APPLY TOPICALLY TO THE AFFECTED AREA TWICE DAILY AS NEEDED 45 g 1   valsartan  (DIOVAN ) 160 MG tablet Take 1 tablet (160 mg total) by mouth daily. 90 tablet 3   No current facility-administered medications on file prior to visit.    Allergies  Allergen Reactions   Latex Itching and Rash   Shellfish Allergy Swelling    face   Paxil [Paroxetine Hcl]     Weight loss and jittery    Past Medical History:  Diagnosis Date   Cancer (HCC)    skin   Chronic venous insufficiency    Depression ~2010   situational and now better    Diabetes mellitus with polyneuropathy (HCC)    GERD (gastroesophageal reflux disease)    Hypertension    IBS (irritable bowel syndrome)    Lumbar disc herniation with radiculopathy    Lumbar stenosis    Osteoarthritis of multiple joints    Phimosis    SCC (squamous cell carcinoma), ear, left     Past Surgical History:  Procedure Laterality Date   COLONOSCOPY WITH PROPOFOL  N/A 07/18/2017   Procedure: COLONOSCOPY WITH PROPOFOL ;  Surgeon: Gaylyn Gladis PENNER, MD;  Location: [facility] ENDOSCOPY;  Service: Endoscopy;  Laterality: N/A;   CYST EXCISION  1952   left leg   KNEE ARTHROSCOPY Left 2011   LUMBAR LAMINECTOMY/DECOMPRESSION MICRODISCECTOMY N/A  11/30/2022   Procedure: L3-S1 POSTERIOR SPINAL DECOMPRESSION;  Surgeon: Clois Fret, MD;  Location: [facility] ORS;  Service: Neurosurgery;  Laterality: N/A;   REPLACEMENT TOTAL KNEE Left    TOTAL HIP ARTHROPLASTY Right     Family History  Problem Relation Age of Onset   Arthritis Mother    Hypertension Mother    Dementia Mother    Heart attack Father    Heart disease Father    Cancer Father        lymphoma    Social History   Socioeconomic History   Marital status: Widowed    Spouse name: Not on file   Number of children: 1   Years of education: Not on file   Highest education level: Not on file  Occupational History   Occupation: Multimedia programmer     Comment: Retired  Tobacco Use   Smoking status: Never    Passive exposure: Past   Smokeless tobacco: Never  Vaping Use   Vaping status: Never Used  Substance and Sexual Activity   Alcohol use: No   Drug use: No   Sexual activity: Never  Other Topics Concern   Not on file  Social History Narrative   1 daughter--local   Wife died 06/22/23      No living will   Daughter should make health care decisions   Requests DNR--done 7.22/24   Not sure about tube feeds   Social Drivers of Health   Financial Resource Strain: Low Risk  (09/28/2023)   Overall Financial Resource Strain (CARDIA)    Difficulty of Paying Living Expenses: Not very hard  Food Insecurity: No Food Insecurity (09/28/2023)   Hunger Vital Sign    Worried About Running Out of Food in the Last Year: Never true    Ran Out of Food in the Last Year: Never true  Transportation Needs: No Transportation Needs (09/28/2023)   PRAPARE - Administrator, Civil Service (Medical): No    Lack of Transportation (Non-Medical): No  Physical Activity: Insufficiently Active (09/28/2023)   Exercise Vital Sign    Days of Exercise per Week: 5 days    Minutes of Exercise per Session: 20 min  Stress: No Stress Concern Present (09/28/2023)   [employer] of Occupational Health - Occupational Stress Questionnaire    Feeling of Stress: Not at all  Social Connections: Socially Integrated (09/28/2023)   Social Connection and Isolation Panel    Frequency of Communication with Friends and Family: More than three times a week    Frequency of Social Gatherings with Friends and Family: More than three times a week    Attends Religious Services: More than 4 times per year    Active Member of [employer] or Organizations: Yes    Attends Engineer, structural: More than 4 times per year    Marital Status: Married  Catering manager Violence: Not At Risk (09/28/2023)   Humiliation, Afraid, Rape, and Kick questionnaire    Fear of Current or  Ex-Partner: No    Emotionally Abused: No    Physically Abused: No    Sexually Abused: No   Review of Systems Weight is stable      Objective:   Physical Exam Constitutional:      Appearance: Normal appearance.  Cardiovascular:     Rate and Rhythm: Normal rate and regular rhythm.     Pulses: Normal pulses.     Heart sounds: No murmur heard.    No gallop.  Pulmonary:  Effort: Pulmonary effort is normal.     Breath sounds: Normal breath sounds. No wheezing or rales.  Musculoskeletal:     Cervical back: Neck supple.     Right lower leg: No edema.     Left lower leg: No edema.  Lymphadenopathy:     Cervical: No cervical adenopathy.  Neurological:     Mental Status: He is alert.  Psychiatric:        Mood and Affect: Mood normal.        Behavior: Behavior normal.            Assessment & Plan:

## 2023-10-02 NOTE — Assessment & Plan Note (Signed)
 A1c still 8.2% On metformin  750mg  daily, glipizide  5mg  daily Will add jardiance  10 daily and recheck in 3 months

## 2023-10-02 NOTE — Addendum Note (Signed)
 Addended by: KALLIE CLOTILDA SQUIBB on: 10/02/2023 11:40 AM   Modules accepted: Orders

## 2023-10-10 ENCOUNTER — Ambulatory Visit
Admission: RE | Admit: 2023-10-10 | Discharge: 2023-10-10 | Disposition: A | Discharge status: Home / Self Care | Payer: Medicare Other | Attending: Gastroenterology | Admitting: Gastroenterology

## 2023-10-10 ENCOUNTER — Other Ambulatory Visit: Payer: Self-pay

## 2023-10-10 ENCOUNTER — Ambulatory Visit: Admitting: Anesthesiology

## 2023-10-10 ENCOUNTER — Encounter
Admission: RE | Disposition: A | Discharge status: Home / Self Care | Payer: Self-pay | Source: Home / Self Care | Attending: Gastroenterology

## 2023-10-10 DIAGNOSIS — D120 Benign neoplasm of cecum: Secondary | ICD-10-CM | POA: Diagnosis not present

## 2023-10-10 DIAGNOSIS — E1142 Type 2 diabetes mellitus with diabetic polyneuropathy: Secondary | ICD-10-CM | POA: Diagnosis not present

## 2023-10-10 DIAGNOSIS — K641 Second degree hemorrhoids: Secondary | ICD-10-CM | POA: Insufficient documentation

## 2023-10-10 DIAGNOSIS — Z7984 Long term (current) use of oral hypoglycemic drugs: Secondary | ICD-10-CM | POA: Insufficient documentation

## 2023-10-10 DIAGNOSIS — Z6832 Body mass index (BMI) 32.0-32.9, adult: Secondary | ICD-10-CM | POA: Insufficient documentation

## 2023-10-10 DIAGNOSIS — K621 Rectal polyp: Secondary | ICD-10-CM | POA: Diagnosis not present

## 2023-10-10 DIAGNOSIS — K219 Gastro-esophageal reflux disease without esophagitis: Secondary | ICD-10-CM | POA: Insufficient documentation

## 2023-10-10 DIAGNOSIS — I10 Essential (primary) hypertension: Secondary | ICD-10-CM | POA: Insufficient documentation

## 2023-10-10 DIAGNOSIS — Z860101 Personal history of adenomatous and serrated colon polyps: Secondary | ICD-10-CM | POA: Diagnosis not present

## 2023-10-10 DIAGNOSIS — Z1211 Encounter for screening for malignant neoplasm of colon: Secondary | ICD-10-CM | POA: Insufficient documentation

## 2023-10-10 DIAGNOSIS — K589 Irritable bowel syndrome without diarrhea: Secondary | ICD-10-CM | POA: Insufficient documentation

## 2023-10-10 DIAGNOSIS — K635 Polyp of colon: Secondary | ICD-10-CM | POA: Diagnosis not present

## 2023-10-10 DIAGNOSIS — E669 Obesity, unspecified: Secondary | ICD-10-CM | POA: Diagnosis not present

## 2023-10-10 DIAGNOSIS — K5730 Diverticulosis of large intestine without perforation or abscess without bleeding: Secondary | ICD-10-CM | POA: Insufficient documentation

## 2023-10-10 HISTORY — DX: Squamous cell carcinoma of skin of left ear and external auricular canal: C44.229

## 2023-10-10 HISTORY — PX: COLONOSCOPY WITH PROPOFOL: SHX5780

## 2023-10-10 HISTORY — DX: Venous insufficiency (chronic) (peripheral): I87.2

## 2023-10-10 HISTORY — PX: POLYPECTOMY: SHX149

## 2023-10-10 HISTORY — DX: Phimosis: N47.1

## 2023-10-10 HISTORY — DX: Spinal stenosis, lumbar region without neurogenic claudication: M48.061

## 2023-10-10 HISTORY — DX: Intervertebral disc disorders with radiculopathy, lumbar region: M51.16

## 2023-10-10 LAB — GLUCOSE, CAPILLARY: Glucose-Capillary: 248 mg/dL — ABNORMAL HIGH (ref 70–99)

## 2023-10-10 SURGERY — COLONOSCOPY WITH PROPOFOL
Anesthesia: General

## 2023-10-10 MED ORDER — INSULIN ASPART 100 UNIT/ML IJ SOLN
INTRAMUSCULAR | Status: AC
  Filled 2023-10-10: qty 1

## 2023-10-10 MED ORDER — INSULIN ASPART 100 UNIT/ML IJ SOLN
5.0000 [IU] | Freq: Once | INTRAMUSCULAR | Status: AC
  Administered 2023-10-10: 5 [IU] via SUBCUTANEOUS

## 2023-10-10 MED ORDER — PROPOFOL 500 MG/50ML IV EMUL
INTRAVENOUS | Status: DC | PRN
  Administered 2023-10-10: 150 ug/kg/min via INTRAVENOUS

## 2023-10-10 MED ORDER — LIDOCAINE HCL (CARDIAC) PF 100 MG/5ML IV SOSY
PREFILLED_SYRINGE | INTRAVENOUS | Status: DC | PRN
  Administered 2023-10-10: 50 mg via INTRAVENOUS

## 2023-10-10 MED ORDER — SODIUM CHLORIDE 0.9 % IV SOLN: INTRAVENOUS | Status: DC

## 2023-10-10 MED ORDER — PROPOFOL 10 MG/ML IV BOLUS
INTRAVENOUS | Status: DC | PRN
Start: 2023-10-10 — End: 2023-10-10
  Administered 2023-10-10: 70 mg via INTRAVENOUS

## 2023-10-10 NOTE — Op Note (Signed)
 [facility] Patient Name: Frank Vega Procedure Date: 10/10/2023 8:39 AM MRN: [id number] Account #: [id number] Date of Birth: 1946/02/15 Admit Type: Outpatient Age: 78 Room: [facility] ENDO ROOM 1 Gender: Male Note Status: Finalized Instrument Name: [id number] Procedure:             Colonoscopy Indications:           Surveillance: Personal history of adenomatous polyps                         on last colonoscopy > 5 years ago Providers:             Ole Schick MD, MD Referring MD:          Charlie CHANETA Denise (Referring MD) Medicines:             Monitored Anesthesia Care Complications:         No immediate complications. Estimated blood loss:                         Minimal. Procedure:             Pre-Anesthesia Assessment:                        - Prior to the procedure, a History and Physical was                         performed, and patient medications and allergies were                         reviewed. The patient is competent. The risks and                         benefits of the procedure and the sedation options and                         risks were discussed with the patient. All questions                         were answered and informed consent was obtained.                         Patient identification and proposed procedure were                         verified by the physician, the nurse, the                         anesthesiologist, the anesthetist and the technician                         in the endoscopy suite. Mental Status Examination:                         alert and oriented. Airway Examination: normal                         oropharyngeal airway and neck mobility. Respiratory  Examination: clear to auscultation. CV Examination:                         normal. Prophylactic Antibiotics: The patient does not                         require prophylactic antibiotics. Prior                          Anticoagulants: The patient has taken no anticoagulant                         or antiplatelet agents. ASA Grade Assessment: III - A                         patient with severe systemic disease. After reviewing                         the risks and benefits, the patient was deemed in                         satisfactory condition to undergo the procedure. The                         anesthesia plan was to use monitored anesthesia care                         (MAC). Immediately prior to administration of                         medications, the patient was re-assessed for adequacy                         to receive sedatives. The heart rate, respiratory                         rate, oxygen saturations, blood pressure, adequacy of                         pulmonary ventilation, and response to care were                         monitored throughout the procedure. The physical                         status of the patient was re-assessed after the                         procedure.                        After obtaining informed consent, the colonoscope was                         passed under direct vision. Throughout the procedure,                         the patient's blood pressure, pulse, and oxygen  saturations were monitored continuously. The                         Colonoscope was introduced through the anus and                         advanced to the the cecum, identified by appendiceal                         orifice and ileocecal valve. The colonoscopy was                         somewhat difficult due to significant looping.                         Successful completion of the procedure was aided by                         applying abdominal pressure. The patient tolerated the                         procedure well. The quality of the bowel preparation                         was good. The ileocecal valve, appendiceal orifice,                         and  rectum were photographed. Findings:      The perianal and digital rectal examinations were normal.      A 10 mm polyp was found in the cecum. The polyp was semi-pedunculated.       The polyp was removed with a cold snare. Resection and retrieval were       complete. Estimated blood loss was minimal.      A 2 mm polyp was found in the ascending colon. The polyp was sessile.       The polyp was removed with a cold snare. Resection was complete, but the       polyp tissue was not retrieved. Estimated blood loss was minimal.      A few large-mouthed and small-mouthed diverticula were found in the       sigmoid colon.      A 3 mm polyp was found in the rectum. The polyp was sessile. The polyp       was removed with a cold snare. Resection and retrieval were complete.       Estimated blood loss was minimal.      Internal hemorrhoids were found during retroflexion. The hemorrhoids       were Grade II (internal hemorrhoids that prolapse but reduce       spontaneously).      The exam was otherwise without abnormality on direct and retroflexion       views. Impression:            - One 10 mm polyp in the cecum, removed with a cold                         snare. Resected and retrieved.                        -  One 2 mm polyp in the ascending colon, removed with                         a cold snare. Complete resection. Polyp tissue not                         retrieved.                        - Diverticulosis in the sigmoid colon.                        - One 3 mm polyp in the rectum, removed with a cold                         snare. Resected and retrieved.                        - Internal hemorrhoids.                        - The examination was otherwise normal on direct and                         retroflexion views. Recommendation:        - Discharge patient to home.                        - Resume previous diet.                        - Continue present medications.                         - Await pathology results.                        - Repeat colonoscopy in 3 years for surveillance.                        - Return to referring physician as previously                         scheduled. Procedure Code(s):     --- Professional ---                        [phone], Colonoscopy, flexible; with removal of                         tumor(s), polyp(s), or other lesion(s) by snare                         technique Diagnosis Code(s):     --- Professional ---                        Z86.010, Personal history of colonic polyps                        D12.0, Benign neoplasm of cecum  D12.2, Benign neoplasm of ascending colon                        D12.8, Benign neoplasm of rectum                        K64.1, Second degree hemorrhoids                        K57.30, Diverticulosis of large intestine without                         perforation or abscess without bleeding CPT copyright 2022 [employer]. All rights reserved. The codes documented in this report are preliminary and upon coder review may  be revised to meet current compliance requirements. Ole Schick MD, MD 10/10/2023 9:14:53 AM Number of Addenda: 0 Note Initiated On: 10/10/2023 8:39 AM Scope Withdrawal Time: 0 hours 11 minutes 41 seconds  Total Procedure Duration: 0 hours 18 minutes 55 seconds  Estimated Blood Loss:  Estimated blood loss was minimal.      [facility]

## 2023-10-10 NOTE — Anesthesia Postprocedure Evaluation (Signed)
 Anesthesia Post Note  Patient: Frank Vega  Procedure(s) Performed: COLONOSCOPY WITH PROPOFOL  POLYPECTOMY, INTESTINE  Patient location during evaluation: PACU Anesthesia Type: General Level of consciousness: awake and alert Pain management: pain level controlled Vital Signs Assessment: post-procedure vital signs reviewed and stable Respiratory status: spontaneous breathing, nonlabored ventilation, respiratory function stable and patient connected to nasal cannula oxygen Cardiovascular status: blood pressure returned to baseline and stable Postop Assessment: no apparent nausea or vomiting Anesthetic complications: no   No notable events documented.   Last Vitals:  Vitals:   10/10/23 0920 10/10/23 0930  BP: 100/67 115/79  Pulse: 82 90  Resp: 16 17  Temp:    SpO2: 97% 96%    Last Pain:  Vitals:   10/10/23 0930  TempSrc:   PainSc: 0-No pain                 Lynwood [name]

## 2023-10-10 NOTE — Transfer of Care (Signed)
 Immediate Anesthesia Transfer of Care Note  Patient: [name]  Procedure(s) Performed: COLONOSCOPY WITH PROPOFOL  POLYPECTOMY, INTESTINE  Patient Location: PACU and Endoscopy Unit  Anesthesia Type:MAC  Level of Consciousness: drowsy  Airway & Oxygen Therapy: Patient Spontanous Breathing  Post-op Assessment: Report given to RN and Post -op Vital signs reviewed and stable  Post vital signs: Reviewed and stable  Last Vitals:  Vitals Value Taken Time  BP    Temp    Pulse    Resp    SpO2      Last Pain:  Vitals:   10/10/23 0913  TempSrc: Temporal  PainSc: 0-No pain         Complications: No notable events documented.

## 2023-10-10 NOTE — Interval H&P Note (Signed)
 History and Physical Interval Note:  10/10/2023 8:42 AM  Frank Vega  has presented today for surgery, with the diagnosis of Z86.0101 (ICD-10-CM) - History of adenomatous polyp of colon.  The various methods of treatment have been discussed with the patient and family. After consideration of risks, benefits and other options for treatment, the patient has consented to  Procedure(s): COLONOSCOPY WITH PROPOFOL  (N/A) as a surgical intervention.  The patient's history has been reviewed, patient examined, no change in status, stable for surgery.  I have reviewed the patient's chart and labs.  Questions were answered to the patient's satisfaction.     Frank Vega  Ok to proceed with colonoscopy

## 2023-10-10 NOTE — Anesthesia Preprocedure Evaluation (Signed)
 Anesthesia Evaluation  Patient identified by MRN, date of birth, ID band Patient awake    Reviewed: Allergy & Precautions, H&P , NPO status , Patient's Chart, lab work & pertinent test results, reviewed documented beta blocker date and time   Airway Mallampati: II   Neck ROM: full    Dental  (+) Poor Dentition   Pulmonary neg pulmonary ROS   Pulmonary exam normal        Cardiovascular Exercise Tolerance: Good hypertension, On Medications negative cardio ROS Normal cardiovascular exam Rhythm:regular Rate:Normal     Neuro/Psych  PSYCHIATRIC DISORDERS  Depression     Neuromuscular disease    GI/Hepatic Neg liver ROS,GERD  Medicated,,  Endo/Other  negative endocrine ROSdiabetes, Well Controlled, Type 2    Renal/GU negative Renal ROS  negative genitourinary   Musculoskeletal   Abdominal   Peds  Hematology negative hematology ROS (+)   Anesthesia Other Findings Past Medical History: No date: Cancer (HCC)     Comment:  skin No date: Chronic venous insufficiency ~2010: Depression     Comment:  situational and now better  No date: Diabetes mellitus with polyneuropathy (HCC) No date: GERD (gastroesophageal reflux disease) No date: Hypertension No date: IBS (irritable bowel syndrome) No date: Lumbar disc herniation with radiculopathy No date: Lumbar stenosis No date: Osteoarthritis of multiple joints No date: Phimosis No date: SCC (squamous cell carcinoma), ear, left Past Surgical History: 07/18/2017: COLONOSCOPY WITH PROPOFOL ; N/A     Comment:  Procedure: COLONOSCOPY WITH PROPOFOL ;  Surgeon:               Gaylyn Gladis PENNER, MD;  Location: [facility] ENDOSCOPY;                Service: Endoscopy;  Laterality: N/A; 1952: CYST EXCISION     Comment:  left leg 2011: KNEE ARTHROSCOPY; Left 11/30/2022: LUMBAR LAMINECTOMY/DECOMPRESSION MICRODISCECTOMY; N/A     Comment:  Procedure: L3-S1 POSTERIOR SPINAL DECOMPRESSION;                 Surgeon: Clois Fret, MD;  Location: [facility] ORS;                Service: Neurosurgery;  Laterality: N/A; No date: REPLACEMENT TOTAL KNEE; Left No date: TOTAL HIP ARTHROPLASTY; Right BMI    Body Mass Index: 32.85 kg/m     Reproductive/Obstetrics negative OB ROS                              Anesthesia Physical Anesthesia Plan  ASA: 3  Anesthesia Plan: General   Post-op Pain Management:    Induction:   PONV Risk Score and Plan:   Airway Management Planned:   Additional Equipment:   Intra-op Plan:   Post-operative Plan:   Informed Consent: I have reviewed the patients History and Physical, chart, labs and discussed the procedure including the risks, benefits and alternatives for the proposed anesthesia with the patient or authorized representative who has indicated his/her understanding and acceptance.     Dental Advisory Given  Plan Discussed with: CRNA  Anesthesia Plan Comments:         Anesthesia Quick Evaluation

## 2023-10-10 NOTE — H&P (Signed)
 Outpatient short stay form Pre-procedure 10/10/2023  Frank ONEIDA Schick, MD  Primary Physician: [name], MD  Reason for visit:  Surveillance colonoscopy  History of present illness:    78 y/o gentleman with history of obesity, DM II, and hypertension here for surveillance colonoscopy. Last colonoscopy in 2019 with small TA. No blood thinners. No family history of colon cancer. No significant abdominal surgeries.    Current Facility-Administered Medications:    0.9 %  sodium chloride  infusion, , Intravenous, Continuous, Kylil Swopes, Frank ONEIDA, MD, Last Rate: 20 mL/hr at 10/10/23 0802, New Bag at 10/10/23 0802  Medications Prior to Admission  Medication Sig Dispense Refill Last Dose/Taking   amoxicillin  (AMOXIL ) 500 MG capsule Take all 4 tablets by mouth 1 hour before dental procedure 4 capsule 2 Past Week   atorvastatin  (LIPITOR) 10 MG tablet TAKE 1 TABLET(10 MG) BY MOUTH 1 TIME A WEEK 13 tablet 3 10/10/2023 Morning   diphenoxylate -atropine  (LOMOTIL ) 2.5-0.025 MG tablet TAKE 1 TABLET BY MOUTH EVERY DAY AS NEEDED FOR DIARRHEA OR LOOSE STOOLS 30 tablet 0 10/09/2023   DULoxetine  (CYMBALTA ) 30 MG capsule TAKE 1 CAPSULE(30 MG) BY MOUTH DAILY 90 capsule 3 10/10/2023 Morning   empagliflozin  (JARDIANCE ) 10 MG TABS tablet Take 1 tablet (10 mg total) by mouth daily before breakfast. 90 tablet 3 Past Week   glipiZIDE  (GLUCOTROL  XL) 5 MG 24 hr tablet TAKE 1 TABLET BY MOUTH EVERY DAY WITH BREAKFAST 90 tablet 3 Past Week   meclizine  (ANTIVERT ) 25 MG tablet Take 1 tablet (25 mg total) by mouth 3 (three) times daily as needed for dizziness. 60 tablet 1 10/09/2023   metFORMIN  (GLUCOPHAGE -XR) 750 MG 24 hr tablet TAKE 1 TABLET BY MOUTH DAILY WITH SUPPER 90 tablet 3 Past Week   Multiple Vitamins-Minerals (MULTIVITAMIN MEN PO) Take 1 tablet by mouth daily.   Past Week   valsartan  (DIOVAN ) 160 MG tablet Take 1 tablet (160 mg total) by mouth daily. 90 tablet 3 10/10/2023 Morning   glucose blood (ONETOUCH ULTRA) test  strip Use to check blood sugar once a day. Dx Code E11.42 100 each 4    mometasone (ELOCON) 0.1 % ointment Apply topically 2 (two) times daily.      OneTouch Delica Lancets 30G MISC 1 each by Does not apply route daily. Dx Code E11.42 100 each 3    senna (SENOKOT) 8.6 MG TABS tablet Take 1 tablet (8.6 mg total) by mouth 2 (two) times daily. 120 tablet 0    triamcinolone  cream (KENALOG ) 0.1 % APPLY TOPICALLY TO THE AFFECTED AREA TWICE DAILY AS NEEDED 45 g 1      Allergies  Allergen Reactions   Latex Itching and Rash   Shellfish Allergy Swelling    face   Paxil [Paroxetine Hcl]     Weight loss and jittery     Past Medical History:  Diagnosis Date   Cancer (HCC)    skin   Chronic venous insufficiency    Depression ~2010   situational and now better    Diabetes mellitus with polyneuropathy (HCC)    GERD (gastroesophageal reflux disease)    Hypertension    IBS (irritable bowel syndrome)    Lumbar disc herniation with radiculopathy    Lumbar stenosis    Osteoarthritis of multiple joints    Phimosis    SCC (squamous cell carcinoma), ear, left     Review of systems:  Otherwise negative.    Physical Exam  Gen: Alert, oriented. Appears stated age.  HEENT: PERRLA. Lungs:  No respiratory distress CV: RRR Abd: soft, benign, no masses Ext: No edema    Planned procedures: Proceed with colonoscopy. The patient understands the nature of the planned procedure, indications, risks, alternatives and potential complications including but not limited to bleeding, infection, perforation, damage to internal organs and possible oversedation/side effects from anesthesia. The patient agrees and gives consent to proceed.  Please refer to procedure notes for findings, recommendations and patient disposition/instructions.     Frank ONEIDA Schick, MD [facility]

## 2023-10-11 LAB — SURGICAL PATHOLOGY

## 2023-10-19 DIAGNOSIS — M25561 Pain in right knee: Secondary | ICD-10-CM | POA: Diagnosis not present

## 2023-10-19 DIAGNOSIS — W010XXA Fall on same level from slipping, tripping and stumbling without subsequent striking against object, initial encounter: Secondary | ICD-10-CM | POA: Diagnosis not present

## 2023-10-19 DIAGNOSIS — M1711 Unilateral primary osteoarthritis, right knee: Secondary | ICD-10-CM | POA: Diagnosis not present

## 2023-11-08 ENCOUNTER — Other Ambulatory Visit: Payer: Self-pay

## 2023-11-08 MED ORDER — DIPHENOXYLATE-ATROPINE 2.5-0.025 MG PO TABS: ORAL_TABLET | ORAL | 0 refills | Status: AC

## 2023-11-23 DIAGNOSIS — H5712 Ocular pain, left eye: Secondary | ICD-10-CM | POA: Diagnosis not present

## 2023-11-23 DIAGNOSIS — H11122 Conjunctival concretions, left eye: Secondary | ICD-10-CM | POA: Diagnosis not present

## 2023-11-27 ENCOUNTER — Other Ambulatory Visit: Payer: Self-pay

## 2023-11-27 MED ORDER — METFORMIN HCL ER 750 MG PO TB24: 750.0000 mg | ORAL_TABLET | Freq: Every day | ORAL | 1 refills | Status: AC

## 2023-11-27 NOTE — Telephone Encounter (Signed)
 Rx sent electronically.  Pt needs to set up a transfer of care visit.

## 2024-01-09 DIAGNOSIS — E1142 Type 2 diabetes mellitus with diabetic polyneuropathy: Secondary | ICD-10-CM | POA: Diagnosis not present

## 2024-01-09 DIAGNOSIS — Z0000 Encounter for general adult medical examination without abnormal findings: Secondary | ICD-10-CM | POA: Diagnosis not present

## 2024-01-09 DIAGNOSIS — I10 Essential (primary) hypertension: Secondary | ICD-10-CM | POA: Diagnosis not present

## 2024-01-09 DIAGNOSIS — Z1331 Encounter for screening for depression: Secondary | ICD-10-CM | POA: Diagnosis not present

## 2024-01-09 DIAGNOSIS — Z23 Encounter for immunization: Secondary | ICD-10-CM | POA: Diagnosis not present

## 2024-02-14 DIAGNOSIS — E1142 Type 2 diabetes mellitus with diabetic polyneuropathy: Secondary | ICD-10-CM | POA: Diagnosis not present

## 2024-02-14 DIAGNOSIS — I10 Essential (primary) hypertension: Secondary | ICD-10-CM | POA: Diagnosis not present

## 2024-10-01 ENCOUNTER — Ambulatory Visit

## 2024-10-04 ENCOUNTER — Ambulatory Visit
